# Patient Record
Sex: Male | Born: 1985 | Race: White | Hispanic: No | Marital: Single | Smoking: Former smoker
Health system: Southern US, Community
[De-identification: ages and names within clinical notes are randomized; demographics above are authoritative.]

## PROBLEM LIST (undated history)

## (undated) DIAGNOSIS — T7840XA Allergy, unspecified, initial encounter: Secondary | ICD-10-CM

## (undated) DIAGNOSIS — F909 Attention-deficit hyperactivity disorder, unspecified type: Secondary | ICD-10-CM

## (undated) DIAGNOSIS — K3580 Unspecified acute appendicitis: Secondary | ICD-10-CM

## (undated) DIAGNOSIS — B009 Herpesviral infection, unspecified: Secondary | ICD-10-CM

## (undated) DIAGNOSIS — F329 Major depressive disorder, single episode, unspecified: Secondary | ICD-10-CM

## (undated) DIAGNOSIS — F32A Depression, unspecified: Secondary | ICD-10-CM

## (undated) DIAGNOSIS — F419 Anxiety disorder, unspecified: Secondary | ICD-10-CM

## (undated) HISTORY — DX: Depression, unspecified: F32.A

## (undated) HISTORY — DX: Attention-deficit hyperactivity disorder, unspecified type: F90.9

## (undated) HISTORY — PX: APPENDECTOMY: SHX54

## (undated) HISTORY — DX: Herpesviral infection, unspecified: B00.9

## (undated) HISTORY — DX: Allergy, unspecified, initial encounter: T78.40XA

## (undated) HISTORY — DX: Anxiety disorder, unspecified: F41.9

## (undated) HISTORY — DX: Major depressive disorder, single episode, unspecified: F32.9

## (undated) HISTORY — PX: WISDOM TOOTH EXTRACTION: SHX21

---
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
## 2006-08-26 ENCOUNTER — Ambulatory Visit: Payer: Self-pay | Admitting: Internal Medicine

## 2006-08-27 ENCOUNTER — Ambulatory Visit: Payer: Self-pay | Admitting: Internal Medicine

## 2006-09-02 ENCOUNTER — Ambulatory Visit: Payer: Self-pay | Admitting: Internal Medicine

## 2006-09-03 ENCOUNTER — Ambulatory Visit: Payer: Self-pay | Admitting: Internal Medicine

## 2006-09-09 ENCOUNTER — Ambulatory Visit: Payer: Self-pay | Admitting: Internal Medicine

## 2006-09-12 ENCOUNTER — Ambulatory Visit: Payer: Self-pay | Admitting: Internal Medicine

## 2006-09-16 ENCOUNTER — Ambulatory Visit: Payer: Self-pay | Admitting: Internal Medicine

## 2006-09-19 ENCOUNTER — Ambulatory Visit: Payer: Self-pay | Admitting: Internal Medicine

## 2006-09-23 ENCOUNTER — Ambulatory Visit: Payer: Self-pay | Admitting: Internal Medicine

## 2006-09-26 ENCOUNTER — Ambulatory Visit: Payer: Self-pay | Admitting: Internal Medicine

## 2006-09-29 ENCOUNTER — Ambulatory Visit: Payer: Self-pay | Admitting: Internal Medicine

## 2006-10-02 ENCOUNTER — Ambulatory Visit: Payer: Self-pay | Admitting: Internal Medicine

## 2006-11-18 ENCOUNTER — Ambulatory Visit: Payer: Self-pay | Admitting: Internal Medicine

## 2006-12-11 ENCOUNTER — Ambulatory Visit: Payer: Self-pay | Admitting: Internal Medicine

## 2007-02-18 ENCOUNTER — Ambulatory Visit: Payer: Self-pay | Admitting: Internal Medicine

## 2007-02-27 ENCOUNTER — Ambulatory Visit: Payer: Self-pay | Admitting: Internal Medicine

## 2007-03-04 DIAGNOSIS — F909 Attention-deficit hyperactivity disorder, unspecified type: Secondary | ICD-10-CM | POA: Insufficient documentation

## 2007-03-04 DIAGNOSIS — J302 Other seasonal allergic rhinitis: Secondary | ICD-10-CM | POA: Insufficient documentation

## 2007-03-04 DIAGNOSIS — J3089 Other allergic rhinitis: Secondary | ICD-10-CM

## 2007-04-01 ENCOUNTER — Ambulatory Visit: Payer: Self-pay | Admitting: Internal Medicine

## 2007-04-28 ENCOUNTER — Ambulatory Visit: Payer: Self-pay | Admitting: Internal Medicine

## 2007-07-10 ENCOUNTER — Ambulatory Visit: Payer: Self-pay | Admitting: Internal Medicine

## 2007-07-17 ENCOUNTER — Ambulatory Visit: Payer: Self-pay | Admitting: Internal Medicine

## 2007-07-24 ENCOUNTER — Ambulatory Visit: Payer: Self-pay | Admitting: Internal Medicine

## 2007-08-11 ENCOUNTER — Ambulatory Visit: Payer: Self-pay | Admitting: Internal Medicine

## 2007-08-19 ENCOUNTER — Ambulatory Visit: Payer: Self-pay | Admitting: Internal Medicine

## 2007-08-24 ENCOUNTER — Ambulatory Visit: Payer: Self-pay | Admitting: Internal Medicine

## 2007-09-01 ENCOUNTER — Ambulatory Visit: Payer: Self-pay | Admitting: Internal Medicine

## 2007-09-10 ENCOUNTER — Ambulatory Visit: Payer: Self-pay | Admitting: Internal Medicine

## 2007-09-17 ENCOUNTER — Ambulatory Visit: Payer: Self-pay | Admitting: Internal Medicine

## 2007-10-01 ENCOUNTER — Ambulatory Visit: Payer: Self-pay | Admitting: Internal Medicine

## 2007-10-02 ENCOUNTER — Ambulatory Visit: Payer: Self-pay | Admitting: Internal Medicine

## 2007-10-05 ENCOUNTER — Ambulatory Visit: Payer: Self-pay | Admitting: Internal Medicine

## 2007-10-22 ENCOUNTER — Encounter: Payer: Self-pay | Admitting: Internal Medicine

## 2007-12-16 ENCOUNTER — Ambulatory Visit: Payer: Self-pay | Admitting: Internal Medicine

## 2008-01-15 ENCOUNTER — Ambulatory Visit: Payer: Self-pay | Admitting: Internal Medicine

## 2008-02-17 ENCOUNTER — Ambulatory Visit: Payer: Self-pay | Admitting: Internal Medicine

## 2008-02-29 ENCOUNTER — Ambulatory Visit: Payer: Self-pay | Admitting: Internal Medicine

## 2008-03-18 ENCOUNTER — Ambulatory Visit: Payer: Self-pay | Admitting: Internal Medicine

## 2008-04-12 ENCOUNTER — Ambulatory Visit: Payer: Self-pay | Admitting: Internal Medicine

## 2008-05-09 ENCOUNTER — Ambulatory Visit: Payer: Self-pay | Admitting: Internal Medicine

## 2008-05-19 ENCOUNTER — Ambulatory Visit: Payer: Self-pay | Admitting: Internal Medicine

## 2008-06-09 ENCOUNTER — Ambulatory Visit: Payer: Self-pay | Admitting: Internal Medicine

## 2008-06-27 ENCOUNTER — Ambulatory Visit: Payer: Self-pay | Admitting: Internal Medicine

## 2008-07-15 ENCOUNTER — Ambulatory Visit: Payer: Self-pay | Admitting: Internal Medicine

## 2008-07-29 ENCOUNTER — Ambulatory Visit: Payer: Self-pay | Admitting: Internal Medicine

## 2008-08-04 ENCOUNTER — Ambulatory Visit: Payer: Self-pay | Admitting: Internal Medicine

## 2008-08-09 ENCOUNTER — Ambulatory Visit: Payer: Self-pay | Admitting: Internal Medicine

## 2008-08-25 ENCOUNTER — Ambulatory Visit: Payer: Self-pay | Admitting: Internal Medicine

## 2008-09-19 ENCOUNTER — Ambulatory Visit: Payer: Self-pay | Admitting: Internal Medicine

## 2008-10-03 ENCOUNTER — Ambulatory Visit: Payer: Self-pay | Admitting: Internal Medicine

## 2008-10-12 ENCOUNTER — Ambulatory Visit: Payer: Self-pay | Admitting: Internal Medicine

## 2008-10-17 ENCOUNTER — Ambulatory Visit: Payer: Self-pay | Admitting: Internal Medicine

## 2008-10-24 ENCOUNTER — Ambulatory Visit: Payer: Self-pay | Admitting: Internal Medicine

## 2008-11-08 ENCOUNTER — Ambulatory Visit: Payer: Self-pay | Admitting: Internal Medicine

## 2008-11-17 ENCOUNTER — Ambulatory Visit: Payer: Self-pay | Admitting: Internal Medicine

## 2008-11-22 ENCOUNTER — Ambulatory Visit: Payer: Self-pay | Admitting: Internal Medicine

## 2008-11-23 ENCOUNTER — Ambulatory Visit: Payer: Self-pay | Admitting: Internal Medicine

## 2008-11-29 ENCOUNTER — Ambulatory Visit: Payer: Self-pay | Admitting: Internal Medicine

## 2008-12-13 ENCOUNTER — Ambulatory Visit: Payer: Self-pay | Admitting: Internal Medicine

## 2008-12-20 ENCOUNTER — Ambulatory Visit: Payer: Self-pay | Admitting: Internal Medicine

## 2008-12-29 ENCOUNTER — Ambulatory Visit: Payer: Self-pay | Admitting: Internal Medicine

## 2009-01-03 ENCOUNTER — Ambulatory Visit: Payer: Self-pay | Admitting: Internal Medicine

## 2009-01-24 ENCOUNTER — Ambulatory Visit: Payer: Self-pay | Admitting: Internal Medicine

## 2009-02-01 ENCOUNTER — Ambulatory Visit: Payer: Self-pay | Admitting: Internal Medicine

## 2009-02-21 ENCOUNTER — Ambulatory Visit: Payer: Self-pay | Admitting: Internal Medicine

## 2009-03-06 ENCOUNTER — Ambulatory Visit: Payer: Self-pay | Admitting: Internal Medicine

## 2009-03-15 ENCOUNTER — Ambulatory Visit: Payer: Self-pay | Admitting: Internal Medicine

## 2009-03-27 ENCOUNTER — Ambulatory Visit: Payer: Self-pay | Admitting: Internal Medicine

## 2009-04-05 ENCOUNTER — Ambulatory Visit: Payer: Self-pay | Admitting: Internal Medicine

## 2009-04-12 ENCOUNTER — Ambulatory Visit: Payer: Self-pay | Admitting: Internal Medicine

## 2009-05-11 ENCOUNTER — Ambulatory Visit: Payer: Self-pay | Admitting: Internal Medicine

## 2009-05-24 ENCOUNTER — Ambulatory Visit: Payer: Self-pay | Admitting: Internal Medicine

## 2009-05-31 ENCOUNTER — Ambulatory Visit: Payer: Self-pay | Admitting: Internal Medicine

## 2009-06-09 ENCOUNTER — Ambulatory Visit: Payer: Self-pay | Admitting: Internal Medicine

## 2009-06-27 ENCOUNTER — Ambulatory Visit: Payer: Self-pay | Admitting: Internal Medicine

## 2009-08-11 ENCOUNTER — Ambulatory Visit: Payer: Self-pay | Admitting: Internal Medicine

## 2009-09-29 ENCOUNTER — Ambulatory Visit: Payer: Self-pay | Admitting: Internal Medicine

## 2009-10-05 ENCOUNTER — Encounter: Payer: Self-pay | Admitting: Internal Medicine

## 2009-10-05 ENCOUNTER — Ambulatory Visit: Payer: Self-pay | Admitting: Internal Medicine

## 2009-10-16 ENCOUNTER — Ambulatory Visit: Payer: Self-pay | Admitting: Internal Medicine

## 2009-10-25 ENCOUNTER — Ambulatory Visit: Payer: Self-pay | Admitting: Internal Medicine

## 2009-11-20 ENCOUNTER — Ambulatory Visit: Payer: Self-pay | Admitting: Internal Medicine

## 2009-12-07 ENCOUNTER — Ambulatory Visit: Payer: Self-pay | Admitting: Internal Medicine

## 2009-12-20 ENCOUNTER — Ambulatory Visit: Payer: Self-pay | Admitting: Internal Medicine

## 2010-01-01 ENCOUNTER — Ambulatory Visit: Payer: Self-pay | Admitting: Internal Medicine

## 2010-01-09 ENCOUNTER — Ambulatory Visit: Payer: Self-pay | Admitting: Internal Medicine

## 2010-02-27 ENCOUNTER — Ambulatory Visit: Payer: Self-pay | Admitting: Internal Medicine

## 2010-03-18 ENCOUNTER — Encounter: Payer: Self-pay | Admitting: Orthopaedic Surgery

## 2010-03-20 ENCOUNTER — Ambulatory Visit: Payer: Self-pay | Admitting: Internal Medicine

## 2010-03-28 DIAGNOSIS — J301 Allergic rhinitis due to pollen: Secondary | ICD-10-CM

## 2010-03-29 NOTE — Miscellaneous (Signed)
Summary: Injection Record/[facility]  Injection Record/[facility]   Imported By: Sherian Rein 07/18/2009 11:35:34  _____________________________________________________________________  External Attachment:    Type:   Image     Comment:   External Document

## 2010-03-29 NOTE — Assessment & Plan Note (Signed)
Summary: rov/ mbw   CC:  follow up visit-allergies-has had flare up.Terry [name]  History of Present Illness:  09/10/07- 25 year-old returning for one year follow-up of allergic rhinitis.  He thinks allergy vaccine helps.  He continues at 1:50 given either here or at school.  Admits to smoking "stoma".  Minimal local reaction to vaccine.  Denies wheeze.  10/03/08- Allergic rhinits Some increased nasal congestion. Missed allergy shot this week and has been doing a lot of lawn mowing. He has gotten his shots here for past 6 months. Denies overstimulation from combining ADD meds and Allegra-D. Discussed his experience with nasonex and fluticasone. Doesn't like nasal sprays. Singulair may have helped some.  October 05, 2009- Allergic rhinitis He got busy and missed about a month of allergy vaccine. With that he has definitely realized increasing nasal congestion, sneeze and drip. He has returned and the allergy lab has resumed his shots with no problem. He does not get wheeze or asthma. Denies headache, purulent discharge, earache, cough.  Preventive Screening-Counseling & Management  Alcohol-Tobacco     Smoking Status: quit     Packs/Day: 1/4 to 1/2 ppd     Year Started: 2008     Year Quit: 2010     Tobacco Counseling: to remain off tobacco products  Current Medications (verified): 1)  Allegra D 12 Hr 2)  Prozac 20 Mg  Caps (Fluoxetine Hcl) .... Take 1 By Mouth Once Daily 3)  Allergy Vaccine Go To 1:10 Next Order 4)  Adderall 15 Mg Tabs (Amphetamine-Dextroamphetamine) .... Take 1 By Mouth Once Daily 5)  Valtrex 500 Mg Tabs (Valacyclovir Hcl) .... Take 1 By Mouth Once Daily  Allergies (verified): No Known Drug Allergies  Past History:  Past Medical History: Last updated: 10/03/2008 ALLERGIC RHINITIS (ICD-477.9) ATTENTION DEFICIT HYPERACTIVITY DISORDER (ICD-314.01)  Past Surgical History: Last updated: 10/03/2008 Wisdom teeth  Family History: Last updated: 10/03/2008 parents  living Mother- allergic rhinitis  Social History: Last updated: 10/03/2008 student- college Not smoking not married  Risk Factors: Smoking Status: quit (10/05/2009) Packs/Day: 1/4 to 1/2 ppd (10/05/2009)  Social History: Smoking Status:  quit  Review of Systems      See HPI       The patient complains of nasal congestion/difficulty breathing through nose, sneezing, and itching.  The patient denies shortness of breath with activity, shortness of breath at rest, productive cough, non-productive cough, coughing up blood, chest pain, irregular heartbeats, acid heartburn, indigestion, loss of appetite, weight change, abdominal pain, difficulty swallowing, sore throat, tooth/dental problems, headaches, ear ache, and rash.    Vital Signs:  Patient profile:   25 year old male Weight:      186.25 pounds O2 Sat:      96 % on Room air Pulse rate:   56 / minute BP sitting:   142 / 60  (left arm) Cuff size:   regular  Vitals Entered By: Reynaldo Minium CMA (October 05, 2009 10:53 AM)  O2 Flow:  Room air CC: follow up visit-allergies-has had flare up.    Physical Exam  Additional Exam:  General: A/Ox3; pleasant and cooperative, NAD, SKIN: no rash, lesions NODES: no lymphadenopathy HEENT: [place]/AT, EOM- WNL, Conjuctivae- clear, PERRLA, TM-WNL, Nose- pale, edematous, narrower on right, probable septal deviation, Throat- clear and wnl, Mallampati II NECK: Supple w/ fair ROM, JVD- none, normal carotid impulses w/o bruits Thyroid- normal to palpation CHEST: Clear to P&A HEART: RRR, no m/g/r heard ABDOMEN: Soft and nl;  JWJ:XBJY, nl pulses, no edema  NEURO: Grossly intact to observation      Impression & Recommendations:  Problem # 1:  ALLERGIC RHINITIS (ICD-477.9)  Loss of contrrol with lapse from allergy vaccine has satisfied him that the allergy shots have been helping. He is getting back to 0.5/ 1:10 without problems. He doesn't like nasal sprays as discussed again.. We discussed  his allegra D. We also discussed nasal strips and surgical options.if needed. He remains off tobacco successfully. The following medications were removed from the medication list:    Astepro 0.15 % Soln (Azelastine hcl) .Terry [name]... 1-2 puffs each nostril up to twice daily as needed  Other Orders: Est. Patient Level IV (04540)  Patient Instructions: 1)  Please schedule a follow-up appointment in 1 year. 2)  consider breathe-right nasal strips

## 2010-03-29 NOTE — Miscellaneous (Signed)
Summary: Injection Record / [facility]    Injection Record / [facility]    Imported By: Lennie Odor 10/27/2009 10:07:16  _____________________________________________________________________  External Attachment:    Type:   Image     Comment:   External Document

## 2010-04-10 ENCOUNTER — Ambulatory Visit (INDEPENDENT_AMBULATORY_CARE_PROVIDER_SITE_OTHER): Payer: BC Managed Care – PPO

## 2010-04-10 DIAGNOSIS — J301 Allergic rhinitis due to pollen: Secondary | ICD-10-CM

## 2010-04-25 ENCOUNTER — Ambulatory Visit (INDEPENDENT_AMBULATORY_CARE_PROVIDER_SITE_OTHER): Payer: BC Managed Care – PPO

## 2010-04-25 ENCOUNTER — Encounter: Payer: Self-pay | Admitting: Internal Medicine

## 2010-04-25 DIAGNOSIS — J301 Allergic rhinitis due to pollen: Secondary | ICD-10-CM

## 2010-05-02 ENCOUNTER — Ambulatory Visit (INDEPENDENT_AMBULATORY_CARE_PROVIDER_SITE_OTHER): Payer: BC Managed Care – PPO

## 2010-05-02 ENCOUNTER — Encounter: Payer: Self-pay | Admitting: Internal Medicine

## 2010-05-02 ENCOUNTER — Telehealth (INDEPENDENT_AMBULATORY_CARE_PROVIDER_SITE_OTHER): Payer: Self-pay | Admitting: *Deleted

## 2010-05-02 DIAGNOSIS — J301 Allergic rhinitis due to pollen: Secondary | ICD-10-CM

## 2010-05-07 ENCOUNTER — Ambulatory Visit (INDEPENDENT_AMBULATORY_CARE_PROVIDER_SITE_OTHER): Payer: BC Managed Care – PPO

## 2010-05-07 ENCOUNTER — Encounter: Payer: Self-pay | Admitting: Internal Medicine

## 2010-05-07 DIAGNOSIS — J301 Allergic rhinitis due to pollen: Secondary | ICD-10-CM

## 2010-05-08 NOTE — Assessment & Plan Note (Signed)
Summary: ALLERGY/CB   Nurse Visit   Allergies: No Known Drug Allergies  Orders Added: 1)  Allergy Injection (1) [95115] 

## 2010-05-08 NOTE — Progress Notes (Signed)
Summary: allergy test scheduled  Phone Note Call from Patient   Caller: Patient Call For: Young Summary of Call: Patient came in for his allergy shot and he is due for allergy testing last one was 4 years ago he has runny nose, pins and needle sinus, puffy eyes, lower energy this is with taking the allergy shots weekly. Patient wants to be scheduled for retesting but April 4th is already full. Please advise if patient can be worked in for the testing, He can be reached at [phone] Initial call taken by: Vedia Coffer,  May 02, 2010 3:39 PM  Follow-up for Phone Call        Called, spoke with pt.  ALT scheduled for 05/30/10 at 330.  Pt aware.  He was reminded no antihistamines 3 days prior.  He verbalized understanding. Follow-up by: Gweneth Dimitri RN,  May 02, 2010 4:09 PM

## 2010-05-09 ENCOUNTER — Ambulatory Visit (INDEPENDENT_AMBULATORY_CARE_PROVIDER_SITE_OTHER): Payer: BC Managed Care – PPO

## 2010-05-09 ENCOUNTER — Encounter: Payer: Self-pay | Admitting: Internal Medicine

## 2010-05-09 DIAGNOSIS — J301 Allergic rhinitis due to pollen: Secondary | ICD-10-CM

## 2010-05-14 ENCOUNTER — Encounter: Payer: Self-pay | Admitting: Internal Medicine

## 2010-05-14 ENCOUNTER — Ambulatory Visit (INDEPENDENT_AMBULATORY_CARE_PROVIDER_SITE_OTHER): Payer: BC Managed Care – PPO

## 2010-05-14 DIAGNOSIS — J301 Allergic rhinitis due to pollen: Secondary | ICD-10-CM

## 2010-05-15 NOTE — Assessment & Plan Note (Signed)
Summary: EXTRACT/10/CB  Nurse Visit   Allergies: No Known Drug Allergies  Orders Added: 1)  Antien Therapy Services,1 or multi ([facility]) [95165] 

## 2010-05-15 NOTE — Assessment & Plan Note (Signed)
Summary: allergy/cb  Nurse Visit   Allergies: No Known Drug Allergies  Orders Added: 1)  Allergy Injection (1) [95115] 

## 2010-05-15 NOTE — Miscellaneous (Signed)
Summary: Injection Financial risk analyst   Imported By: Sherian Rein 05/07/2010 14:16:45  _____________________________________________________________________  External Attachment:    Type:   Image     Comment:   External Document

## 2010-05-21 ENCOUNTER — Ambulatory Visit (INDEPENDENT_AMBULATORY_CARE_PROVIDER_SITE_OTHER): Payer: BC Managed Care – PPO

## 2010-05-21 DIAGNOSIS — J301 Allergic rhinitis due to pollen: Secondary | ICD-10-CM

## 2010-05-24 NOTE — Assessment & Plan Note (Signed)
Summary: allergy/cb  Nurse Visit   Allergies: No Known Drug Allergies  Orders Added: 1)  Allergy Injection (1) [95115] 

## 2010-05-29 ENCOUNTER — Encounter: Payer: Self-pay | Admitting: Internal Medicine

## 2010-05-30 ENCOUNTER — Encounter: Payer: Self-pay | Admitting: Internal Medicine

## 2010-05-30 ENCOUNTER — Encounter: Payer: Self-pay | Admitting: *Deleted

## 2010-05-30 ENCOUNTER — Ambulatory Visit (INDEPENDENT_AMBULATORY_CARE_PROVIDER_SITE_OTHER): Payer: BC Managed Care – PPO | Admitting: Internal Medicine

## 2010-05-30 VITALS — BP 112/68 | HR 66 | Ht 71.0 in | Wt 193.0 lb

## 2010-05-30 DIAGNOSIS — J309 Allergic rhinitis, unspecified: Secondary | ICD-10-CM

## 2010-05-30 NOTE — Patient Instructions (Signed)
I will have the allergy lab make up a new vaccine to start as a new build-up, after you use up your current vaccine supply.  You can take antihistamines/ allergy meds as needed. Please call if you have any questions.

## 2010-05-30 NOTE — Progress Notes (Signed)
  Subjective:    Patient ID: Terry [name], male    DOB: May 15, 1985, 25 y.o.   MRN: [id number]  HPI 74 yoM former smoker here for allergy skin testing with hx allergic rhinitis. Was worse in early March, with constant sneeze, headache, watery eyes postnasal drip. Doesn't think he had a cold. He has actually felt some better in the last 2 weeks, taking only occasional antihstamine not daily. Stopped as directed 3 days ago. Not much chest discomfort- maybe a little bit tight.  Has been consistent about allergy vaccine since restarted 1 year ago, but with last testing over 3 years ago, he felt it was time to reassess.  Dislikes nasal sprays. In senior year college, living in rental apartment- discussed ability to control dust/ mold and etc.  Skin test- Pos 05/30/10  He plans to stay i this area at least 2 more years.      Review of Systems See HPI Constitutional:   No weight loss, night sweats,  Fevers, chills, fatigue, lassitude. HEENT:   No headaches,  Difficulty swallowing,  Tooth/dental problems,  Sore throat,                Some sneezing, itching, , nasal congestion, post nasal drip,   CV:  No chest pain,  Orthopnea, PND, swelling in lower extremities, anasarca, dizziness, palpitations  GI  No heartburn, indigestion, abdominal pain, nausea, vomiting, diarrhea, change in bowel habits, loss of appetite  Resp: No shortness of breath with exertion or at rest.  No excess mucus, no productive cough,  No non-productive cough,  No coughing up of blood.  No change in color of mucus.  No wheezing.  No chest wall deformity  Skin: no rash or lesions.  GU: no dysuria, change in color of urine, no urgency or frequency.  No flank pain.  MS:  No joint pain or swelling.  No decreased range of motion.  No back pain.  Psych:  No change in mood or affect. No depression or anxiety.  No memory loss.      Objective:   Physical Exam General- Alert, Oriented, Affect-appropriate, Distress- none acute    WDWN young man  Skin- rash-none, lesions- none, excoriation- none. large tatoos on upper back  Lymphadenopathy- none  Head- atraumatic  Eyes- Gross vision intact, PERRLA, conjunctivae clear, secretions clear  Ears- Normal- Hearing, canals, Tm L ,   R ,  Nose- Clear,  No- Septal dev, mucus, polyps, erosion, perforation   Throat- Mallampati II , mucosa clear , drainage- none, tonsils- atrophic  Neck- flexible , trachea midline, no stridor , thyroid nl, carotid no bruit  Chest - symmetrical excursion , unlabored     Heart/CV- RRR , no murmur , no gallop  , no rub, nl s1 s2                     - JVD- none , edema- none, stasis changes- none, varices- none     Lung- clear to P&A, wheeze- none, cough- none , dullness-none, rub- none     Chest wall-   Abd- tender-no, distended-no, bowel sounds-present, HSM- no  Br/ Gen/ Rectal- Not done, not indicated  Extrem- cyanosis- none, clubbing, none, atrophy- none, strength- nl  Neuro- grossly intact to observation       Assessment & Plan:

## 2010-06-05 ENCOUNTER — Encounter: Payer: Self-pay | Admitting: Internal Medicine

## 2010-06-05 NOTE — Assessment & Plan Note (Signed)
He plans to remain in this area at least a few more years after graduation. Since he may be losing some control on current vaccine mix, he chose to remix and rebuild allergy vaccine based on today's testing. He will use up current supply then start new.

## 2010-06-11 ENCOUNTER — Ambulatory Visit (INDEPENDENT_AMBULATORY_CARE_PROVIDER_SITE_OTHER): Payer: BC Managed Care – PPO

## 2010-06-11 DIAGNOSIS — J309 Allergic rhinitis, unspecified: Secondary | ICD-10-CM

## 2010-06-13 ENCOUNTER — Ambulatory Visit (INDEPENDENT_AMBULATORY_CARE_PROVIDER_SITE_OTHER): Payer: BC Managed Care – PPO

## 2010-06-13 DIAGNOSIS — J309 Allergic rhinitis, unspecified: Secondary | ICD-10-CM

## 2010-06-18 ENCOUNTER — Ambulatory Visit (INDEPENDENT_AMBULATORY_CARE_PROVIDER_SITE_OTHER): Payer: BC Managed Care – PPO

## 2010-06-18 ENCOUNTER — Encounter: Payer: Self-pay | Admitting: Internal Medicine

## 2010-06-18 DIAGNOSIS — J309 Allergic rhinitis, unspecified: Secondary | ICD-10-CM

## 2010-06-26 ENCOUNTER — Ambulatory Visit (INDEPENDENT_AMBULATORY_CARE_PROVIDER_SITE_OTHER): Payer: BC Managed Care – PPO

## 2010-06-26 DIAGNOSIS — J309 Allergic rhinitis, unspecified: Secondary | ICD-10-CM

## 2010-07-10 NOTE — Assessment & Plan Note (Signed)
[facility]                             PULMONARY OFFICE NOTE   REDFORD, BEHRLE                      MRN:          [id number]  DATE:08/26/2006                            DOB:          15-Apr-1985    PROBLEM:  A 25 year old self-referred for allergy evaluation.   HISTORY OF PRESENT ILLNESS:  Eyes itch, sneezes, rhinorrhea, head and  chest congestion over the past 8 years. In the past, he had been able to  control with antihistamines, noting worst symptoms in spring and fall  with cats, house dust, and mold. Allegra D worked for a while. Singulair  has helped a little. He dislikes nasal sprays. Never diagnosed with  asthma.   MEDICATIONS:  Allegra D, Concerta.   ALLERGIES:  NO KNOWN DRUG ALLERGIES.   REVIEW OF SYSTEMS:  Pressure headaches, nasal congestion, sneezing, and  itching. No wheeze. Nothing purulent or bloody. No fever, adenopathy,  chill, or rash.   PAST MEDICAL HISTORY:  No ENT surgery. No history of asthma. He has had  no surgeries at all. Concerta apparently for ADHD. No history of skin  rash or urticaria, unusual reactions to foods or to insects. No  intolerance to latex, contrast dye, or aspirin.   SOCIAL HISTORY:  He is smoking 1/4 to 1/2 pack per day. One case of beer  a week. He is not married. Lives with parents. Student at [employer].  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   FAMILY HISTORY:  Mother with allergies.   PHYSICAL EXAMINATION:  VITAL SIGNS:  Weight 163 pounds. Blood pressure  120/60. Pulse 82. Room air saturation 100%.  GENERAL:  Healthy appearing young man.  SKIN:  Lightly tanned. No adenopathy found.  HEENT:  Turbinates edematous. Clear mucous bridging. No polyps.  Conjunctivae not injected. No periorbital edema. Pharynx is clear.  Tonsils are not prominent. There is erythema or drainage. Voice quality  is normal.  NECK:  Veins not distended. No thyromegaly.  CHEST:   Quiet clear lung fields.  HEART:  Normal sounds.  EXTREMITIES:  Without clubbing, cyanosis, or edema.   IMPRESSION:  Allergic rhinitis, seasonal and perennial.   PLAN:  1. Environmental precautions emphasizing the avoidance of dust, mold,      and animal dander's with print information given.  2. Suggested Claritin as an alternative to his Allegra and discussed      the use of Sudafed PE.  3. He is willing to try Nasonex, although he admits to disliking nasal      sprays. He requests allergy skin testing.  4. He will be scheduled to return for skin testing as available.     Clinton D. Maple Hudson, MD, Tonny Bollman, FACP  Electronically Signed    CDY/MedQ  DD: 08/30/2006  DT: 08/30/2006  Job #: [id number]

## 2010-07-10 NOTE — Assessment & Plan Note (Signed)
[facility]                             PULMONARY OFFICE NOTE   JOMES, GIRALDO                      MRN:          [id number]  DATE:08/27/2006                            DOB:          1985/03/22    ALLERGY FOLLOW UP:   PROBLEM:  Allergic rhinitis.   HISTORY:  This young man returns for allergy testing off of  antihistamines.  He has an air cleaner in his bedroom, two dogs at home.   OBJECTIVE:  Weight 163 pounds, blood pressure 122/62, pulse 78, room air  saturation 100%.   SKIN TESTS:  Positive histamine, negative diluent controls. Strong  positive reactions for dust mite and for dog. Lesser but significant  reactions for rag weed and some other fall weeds and tree pollens.   IMPRESSION:  Allergic rhinitis.   PLAN:  Environmental controls were re-emphasized and conservative  therapies were discussed.  He wants to try allergy vaccine in the hopes  that it will cause some long term blunting of this syndrome.  We had a  frank and realistic talk about risk and benefit of allergy vaccine,  goals and timing.  We are going to start allergy vaccine here and he  will be able to transfer administration to student health during the  school year.  Scheduled to return in 2 months, earlier p.r.n.     Clinton D. Maple Hudson, MD, Tonny Bollman, FACP  Electronically Signed    CDY/MedQ  DD: 08/30/2006  DT: 08/30/2006  Job #: [id number]

## 2010-07-24 ENCOUNTER — Ambulatory Visit (INDEPENDENT_AMBULATORY_CARE_PROVIDER_SITE_OTHER): Payer: BC Managed Care – PPO

## 2010-07-24 DIAGNOSIS — J309 Allergic rhinitis, unspecified: Secondary | ICD-10-CM

## 2010-08-17 ENCOUNTER — Ambulatory Visit (INDEPENDENT_AMBULATORY_CARE_PROVIDER_SITE_OTHER): Payer: BC Managed Care – PPO

## 2010-08-17 DIAGNOSIS — J309 Allergic rhinitis, unspecified: Secondary | ICD-10-CM

## 2010-09-04 ENCOUNTER — Ambulatory Visit (INDEPENDENT_AMBULATORY_CARE_PROVIDER_SITE_OTHER): Payer: BC Managed Care – PPO

## 2010-09-04 DIAGNOSIS — J309 Allergic rhinitis, unspecified: Secondary | ICD-10-CM

## 2010-09-13 ENCOUNTER — Ambulatory Visit (INDEPENDENT_AMBULATORY_CARE_PROVIDER_SITE_OTHER): Payer: BC Managed Care – PPO

## 2010-09-13 DIAGNOSIS — J309 Allergic rhinitis, unspecified: Secondary | ICD-10-CM

## 2010-10-04 ENCOUNTER — Ambulatory Visit: Payer: Self-pay | Admitting: Internal Medicine

## 2010-10-15 ENCOUNTER — Ambulatory Visit (INDEPENDENT_AMBULATORY_CARE_PROVIDER_SITE_OTHER): Payer: BC Managed Care – PPO

## 2010-10-15 DIAGNOSIS — J309 Allergic rhinitis, unspecified: Secondary | ICD-10-CM

## 2010-10-25 ENCOUNTER — Ambulatory Visit (INDEPENDENT_AMBULATORY_CARE_PROVIDER_SITE_OTHER): Payer: BC Managed Care – PPO

## 2010-10-25 DIAGNOSIS — J309 Allergic rhinitis, unspecified: Secondary | ICD-10-CM

## 2010-11-01 ENCOUNTER — Ambulatory Visit (INDEPENDENT_AMBULATORY_CARE_PROVIDER_SITE_OTHER): Payer: BC Managed Care – PPO

## 2010-11-01 DIAGNOSIS — J309 Allergic rhinitis, unspecified: Secondary | ICD-10-CM

## 2010-11-02 ENCOUNTER — Ambulatory Visit: Payer: BC Managed Care – PPO | Admitting: Internal Medicine

## 2010-11-05 ENCOUNTER — Encounter: Payer: Self-pay | Admitting: Internal Medicine

## 2010-11-14 ENCOUNTER — Ambulatory Visit (INDEPENDENT_AMBULATORY_CARE_PROVIDER_SITE_OTHER): Payer: BC Managed Care – PPO

## 2010-11-14 DIAGNOSIS — J309 Allergic rhinitis, unspecified: Secondary | ICD-10-CM

## 2010-11-16 ENCOUNTER — Encounter: Payer: Self-pay | Admitting: Internal Medicine

## 2010-11-16 ENCOUNTER — Ambulatory Visit (INDEPENDENT_AMBULATORY_CARE_PROVIDER_SITE_OTHER): Payer: BC Managed Care – PPO | Admitting: Internal Medicine

## 2010-11-16 VITALS — BP 118/72 | HR 74 | Ht 71.0 in | Wt 186.0 lb

## 2010-11-16 DIAGNOSIS — J309 Allergic rhinitis, unspecified: Secondary | ICD-10-CM

## 2010-11-16 MED ORDER — OLOPATADINE HCL 0.6 % NA SOLN: NASAL | Status: DC

## 2010-11-16 MED ORDER — FLUOXETINE HCL 20 MG PO CAPS: 20.0000 mg | ORAL_CAPSULE | Freq: Every day | ORAL | Status: DC

## 2010-11-16 NOTE — Assessment & Plan Note (Addendum)
We will continue allergy vaccine for now while he finishes school.  Continue Allegra D Despite his dislike of nasal sprays, he is willing to try a sample of patanase for occasional antihistamine use as discussed.  Consider reassessing his allergy vaccine to include grass based on his April 2012 skin testing. This would make sense if he is going to remain in this area after graduation. We will see how he feels to grass pollen season in the coming spring.

## 2010-11-16 NOTE — Progress Notes (Signed)
Subjective:    Patient ID: Terry [name], male    DOB: 1985/06/19, 25 y.o.   MRN: [id number]  HPI  05/30/10- 3 yoM former smoker here for allergy skin testing with hx allergic rhinitis. Was worse in early March, with constant sneeze, headache, watery eyes postnasal drip. Doesn't think he had a cold. He has actually felt some better in the last 2 weeks, taking only occasional antihstamine not daily. Stopped as directed 3 days ago. Not much chest discomfort- maybe a little bit tight.  Has been consistent about allergy vaccine since restarted 1 year ago, but with last testing over 3 years ago, he felt it was time to reassess.  Dislikes nasal sprays. In senior year college, living in rental apartment- discussed ability to control dust/ mold and etc.  Skin test- Pos 05/30/10  He plans to stay in this area at least 2 more years.   11/16/10- 25 yo male former smoker followed for allergic rhinitis, complicated by ADHD This is last year of college, living in a house. Allergy shots here- definitely help. Occasional Allegra-D if needed- helps with itchy eyes and sneeze but less effective with stuffiness. Ears and chest do fine. Again notes he dislikes nasal sprays. Failed Singulair. Allergy skin testing was repeated 05/30/2010 and shows changes from his current vaccine , in particular he is developing a sensitization to grass pollens. This would be consistent with his increased symptoms in March.  Review of Systems  Constitutional:   No-   weight loss, night sweats, fevers, chills, fatigue, lassitude. HEENT:   No-  headaches, difficulty swallowing, tooth/dental problems, sore throat,       No-current  sneezing, itching, ear ache, nasal congestion, post nasal drip,  CV:  No-   chest pain, orthopnea, PND, swelling in lower extremities, anasarca, dizziness, palpitations Resp: No-   shortness of breath with exertion or at rest.              No-   productive cough,  No non-productive cough,  No-  coughing up  of blood.              No-   change in color of mucus.  No- wheezing.   Skin: No-   rash or lesions. GI:  No-   heartburn, indigestion, abdominal pain, nausea, vomiting, diarrhea,                 change in bowel habits, loss of appetite GU: No-   dysuria, change in color of urine, no urgency or frequency.  No- flank pain. MS:  No-   joint pain or swelling.  No- decreased range of motion.  No- back pain. Neuro- grossly normal  Psych:  No- change in mood or affect. No depression or anxiety.  No memory loss.     Objective:   Physical Exam General- Alert, Oriented, Affect-appropriate, Distress- none acute, well appearing Jaymeson Mengel man Skin- rash-none, lesions- none, excoriation- none Lymphadenopathy- none Head- atraumatic            Eyes- Gross vision intact, PERRLA, conjunctivae clear secretions            Ears- Hearing, canals-normal            Nose- Clear, no-Septal dev, mucus, polyps, erosion, perforation             Throat- Mallampati II , mucosa clear , drainage- none, tonsils- atrophic Neck- flexible , trachea midline, no stridor , thyroid nl, carotid no bruit Chest - symmetrical excursion ,  unlabored           Heart/CV- RRR , no murmur , no gallop  , no rub, nl s1 s2                           - JVD- none , edema- none, stasis changes- none, varices- none           Lung- clear to P&A, wheeze- none, cough- none , dullness-none, rub- none           Chest wall-  Abd- tender-no, distended-no, bowel sounds-present, HSM- no Br/ Gen/ Rectal- Not done, not indicated Extrem- cyanosis- none, clubbing, none, atrophy- none, strength- nl Neuro- grossly intact to observation

## 2010-11-16 NOTE — Patient Instructions (Addendum)
Continue allergy vaccine  Sample/ script Patanase     1-2 puffs each nostril up to twice daily as needed  Courtesy refill of Prozac while here

## 2010-11-26 ENCOUNTER — Ambulatory Visit (INDEPENDENT_AMBULATORY_CARE_PROVIDER_SITE_OTHER): Payer: BC Managed Care – PPO

## 2010-11-26 DIAGNOSIS — J309 Allergic rhinitis, unspecified: Secondary | ICD-10-CM

## 2010-12-17 ENCOUNTER — Ambulatory Visit (INDEPENDENT_AMBULATORY_CARE_PROVIDER_SITE_OTHER): Payer: BC Managed Care – PPO

## 2010-12-17 DIAGNOSIS — J309 Allergic rhinitis, unspecified: Secondary | ICD-10-CM

## 2011-01-03 ENCOUNTER — Ambulatory Visit (INDEPENDENT_AMBULATORY_CARE_PROVIDER_SITE_OTHER): Payer: BC Managed Care – PPO

## 2011-01-03 DIAGNOSIS — J309 Allergic rhinitis, unspecified: Secondary | ICD-10-CM

## 2011-02-08 ENCOUNTER — Ambulatory Visit (INDEPENDENT_AMBULATORY_CARE_PROVIDER_SITE_OTHER): Payer: BC Managed Care – PPO

## 2011-02-08 DIAGNOSIS — J309 Allergic rhinitis, unspecified: Secondary | ICD-10-CM

## 2011-03-07 ENCOUNTER — Ambulatory Visit (INDEPENDENT_AMBULATORY_CARE_PROVIDER_SITE_OTHER): Payer: BC Managed Care – PPO

## 2011-03-07 DIAGNOSIS — J309 Allergic rhinitis, unspecified: Secondary | ICD-10-CM

## 2011-03-11 ENCOUNTER — Ambulatory Visit (INDEPENDENT_AMBULATORY_CARE_PROVIDER_SITE_OTHER): Payer: BC Managed Care – PPO

## 2011-03-11 DIAGNOSIS — J309 Allergic rhinitis, unspecified: Secondary | ICD-10-CM

## 2011-03-12 ENCOUNTER — Encounter: Payer: Self-pay | Admitting: Internal Medicine

## 2011-04-15 ENCOUNTER — Ambulatory Visit (INDEPENDENT_AMBULATORY_CARE_PROVIDER_SITE_OTHER): Payer: BC Managed Care – PPO

## 2011-04-15 DIAGNOSIS — J309 Allergic rhinitis, unspecified: Secondary | ICD-10-CM

## 2011-04-19 ENCOUNTER — Ambulatory Visit (INDEPENDENT_AMBULATORY_CARE_PROVIDER_SITE_OTHER): Payer: BC Managed Care – PPO

## 2011-04-19 DIAGNOSIS — J309 Allergic rhinitis, unspecified: Secondary | ICD-10-CM

## 2011-04-29 ENCOUNTER — Ambulatory Visit: Payer: BC Managed Care – PPO

## 2011-04-29 ENCOUNTER — Ambulatory Visit (INDEPENDENT_AMBULATORY_CARE_PROVIDER_SITE_OTHER): Payer: BC Managed Care – PPO

## 2011-04-29 DIAGNOSIS — J309 Allergic rhinitis, unspecified: Secondary | ICD-10-CM

## 2011-05-21 ENCOUNTER — Ambulatory Visit (INDEPENDENT_AMBULATORY_CARE_PROVIDER_SITE_OTHER): Payer: BC Managed Care – PPO

## 2011-05-21 DIAGNOSIS — J309 Allergic rhinitis, unspecified: Secondary | ICD-10-CM

## 2011-05-23 ENCOUNTER — Ambulatory Visit (INDEPENDENT_AMBULATORY_CARE_PROVIDER_SITE_OTHER): Payer: BC Managed Care – PPO

## 2011-05-23 DIAGNOSIS — J309 Allergic rhinitis, unspecified: Secondary | ICD-10-CM

## 2011-07-10 ENCOUNTER — Ambulatory Visit (INDEPENDENT_AMBULATORY_CARE_PROVIDER_SITE_OTHER): Payer: BC Managed Care – PPO

## 2011-07-10 DIAGNOSIS — J309 Allergic rhinitis, unspecified: Secondary | ICD-10-CM

## 2011-09-02 ENCOUNTER — Ambulatory Visit (INDEPENDENT_AMBULATORY_CARE_PROVIDER_SITE_OTHER): Payer: BC Managed Care – PPO

## 2011-09-02 DIAGNOSIS — J309 Allergic rhinitis, unspecified: Secondary | ICD-10-CM

## 2011-09-26 ENCOUNTER — Ambulatory Visit (INDEPENDENT_AMBULATORY_CARE_PROVIDER_SITE_OTHER): Payer: BC Managed Care – PPO

## 2011-09-26 DIAGNOSIS — J309 Allergic rhinitis, unspecified: Secondary | ICD-10-CM

## 2011-09-30 ENCOUNTER — Ambulatory Visit (INDEPENDENT_AMBULATORY_CARE_PROVIDER_SITE_OTHER): Payer: BC Managed Care – PPO

## 2011-09-30 DIAGNOSIS — J309 Allergic rhinitis, unspecified: Secondary | ICD-10-CM

## 2011-11-05 ENCOUNTER — Ambulatory Visit (INDEPENDENT_AMBULATORY_CARE_PROVIDER_SITE_OTHER): Payer: BC Managed Care – PPO

## 2011-11-05 DIAGNOSIS — J309 Allergic rhinitis, unspecified: Secondary | ICD-10-CM

## 2011-11-08 ENCOUNTER — Ambulatory Visit (INDEPENDENT_AMBULATORY_CARE_PROVIDER_SITE_OTHER): Payer: BC Managed Care – PPO

## 2011-11-08 DIAGNOSIS — J309 Allergic rhinitis, unspecified: Secondary | ICD-10-CM

## 2011-11-22 ENCOUNTER — Ambulatory Visit (INDEPENDENT_AMBULATORY_CARE_PROVIDER_SITE_OTHER): Payer: BC Managed Care – PPO

## 2011-11-22 ENCOUNTER — Ambulatory Visit (INDEPENDENT_AMBULATORY_CARE_PROVIDER_SITE_OTHER): Payer: BC Managed Care – PPO | Admitting: Internal Medicine

## 2011-11-22 ENCOUNTER — Encounter: Payer: Self-pay | Admitting: Internal Medicine

## 2011-11-22 VITALS — BP 124/82 | HR 77 | Ht 71.0 in | Wt 188.8 lb

## 2011-11-22 DIAGNOSIS — J3089 Other allergic rhinitis: Secondary | ICD-10-CM

## 2011-11-22 DIAGNOSIS — J302 Other seasonal allergic rhinitis: Secondary | ICD-10-CM

## 2011-11-22 DIAGNOSIS — J309 Allergic rhinitis, unspecified: Secondary | ICD-10-CM

## 2011-11-22 NOTE — Patient Instructions (Addendum)
We can continue allergy vaccine. Work with the allergy lab to keep on schedule so you can build the potency of your shots.  Ok to take an antihistamine/ decongestant like the allegra-d as needed  Please call as needed

## 2011-11-22 NOTE — Progress Notes (Signed)
Subjective:    Patient ID: Terry [name], male    DOB: 08/16/1985, 26 y.o.   MRN: [id number]  HPI  05/30/10- 22 yoM former smoker here for allergy skin testing with hx allergic rhinitis. Was worse in early March, with constant sneeze, headache, watery eyes postnasal drip. Doesn't think he had a cold. He has actually felt some better in the last 2 weeks, taking only occasional antihstamine not daily. Stopped as directed 3 days ago. Not much chest discomfort- maybe a little bit tight.  Has been consistent about allergy vaccine since restarted 1 year ago, but with last testing over 3 years ago, he felt it was time to reassess.  Dislikes nasal sprays. In senior year college, living in rental apartment- discussed ability to control dust/ mold and etc.  Skin test- Pos 05/30/10  He plans to stay in this area at least 2 more years.   11/16/10- 26 yo male former smoker followed for allergic rhinitis, complicated by ADHD This is last year of college, living in a house. Allergy shots here- definitely help. Occasional Allegra-D if needed- helps with itchy eyes and sneeze but less effective with stuffiness. Ears and chest do fine. Again notes he dislikes nasal sprays. Failed Singulair. Allergy skin testing was repeated 05/30/2010 and shows changes from his current vaccine , in particular he is developing a sensitization to grass pollens. This would be consistent with his increased symptoms in March.  11/22/11- 26 yo male former smoker followed for allergic rhinitis, complicated by ADHD Follows for: Doing well, still taking allergy vaccine. He restarted and is rebuilding from 1:50,000 [facility] as planned. 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 No longer in college. No plans to leave this area. He feels his allergies are well controlled, rarely needing Allegra-D in pollen season. He prefers to stay on allergy shots. He declines flu vaccine.  Review of Systems- see HPI Constitutional:   No-    weight loss, night sweats, fevers, chills, fatigue, lassitude. HEENT:   No-  headaches, difficulty swallowing, tooth/dental problems, sore throat,       No-current  sneezing, itching, ear ache, nasal congestion, post nasal drip,  CV:  No-   chest pain, orthopnea, PND, swelling in lower extremities, anasarca, dizziness, palpitations Resp: No-   shortness of breath with exertion or at rest.              No-   productive cough,  No non-productive cough,  No-  coughing up of blood.              No-   change in color of mucus.  No- wheezing.   Skin: No-   rash or lesions. GI:  No-   heartburn, indigestion, abdominal pain, nausea, vomiting,  GU:  MS:  No-   joint pain or swelling.   Neuro- grossly normal  Psych:  No- change in mood or affect. No depression or anxiety.  No memory loss.     Objective:   Physical Exam General- Alert, Oriented, Affect-appropriate, Distress- none acute, well appearing young man Skin- rash-none, lesions- none, excoriation- none Lymphadenopathy- none Head- atraumatic            Eyes- Gross vision intact, PERRLA, conjunctivae clear secretions            Ears- Hearing, canals-normal            Nose- Clear, no-Septal dev, mucus, polyps, erosion, perforation  Throat- Mallampati II , mucosa clear , drainage- none, tonsils- atrophic Neck- flexible , trachea midline, no stridor , thyroid nl, carotid no bruit Chest - symmetrical excursion , unlabored           Heart/CV- RRR , no murmur , no gallop  , no rub, nl s1 s2                           - JVD- none , edema- none, stasis changes- none, varices- none           Lung- clear to P&A, wheeze- none, cough- none , dullness-none, rub- none           Chest wall-  Abd-  Br/ Gen/ Rectal- Not done, not indicated Extrem- cyanosis- none, clubbing, none, atrophy- none, strength- nl Neuro- grossly intact to observation

## 2011-12-03 ENCOUNTER — Ambulatory Visit (INDEPENDENT_AMBULATORY_CARE_PROVIDER_SITE_OTHER): Payer: BC Managed Care – PPO

## 2011-12-03 DIAGNOSIS — J309 Allergic rhinitis, unspecified: Secondary | ICD-10-CM

## 2011-12-06 ENCOUNTER — Ambulatory Visit (INDEPENDENT_AMBULATORY_CARE_PROVIDER_SITE_OTHER): Payer: BC Managed Care – PPO

## 2011-12-06 DIAGNOSIS — J309 Allergic rhinitis, unspecified: Secondary | ICD-10-CM

## 2011-12-10 ENCOUNTER — Ambulatory Visit (INDEPENDENT_AMBULATORY_CARE_PROVIDER_SITE_OTHER): Payer: BC Managed Care – PPO

## 2011-12-10 ENCOUNTER — Encounter: Payer: Self-pay | Admitting: Internal Medicine

## 2011-12-10 DIAGNOSIS — J309 Allergic rhinitis, unspecified: Secondary | ICD-10-CM

## 2011-12-23 ENCOUNTER — Ambulatory Visit (INDEPENDENT_AMBULATORY_CARE_PROVIDER_SITE_OTHER): Payer: BC Managed Care – PPO

## 2011-12-23 DIAGNOSIS — J309 Allergic rhinitis, unspecified: Secondary | ICD-10-CM

## 2011-12-24 ENCOUNTER — Ambulatory Visit (INDEPENDENT_AMBULATORY_CARE_PROVIDER_SITE_OTHER): Payer: BC Managed Care – PPO

## 2011-12-24 DIAGNOSIS — J309 Allergic rhinitis, unspecified: Secondary | ICD-10-CM

## 2011-12-27 ENCOUNTER — Ambulatory Visit (INDEPENDENT_AMBULATORY_CARE_PROVIDER_SITE_OTHER): Payer: BC Managed Care – PPO

## 2011-12-27 DIAGNOSIS — J309 Allergic rhinitis, unspecified: Secondary | ICD-10-CM

## 2011-12-31 ENCOUNTER — Ambulatory Visit (INDEPENDENT_AMBULATORY_CARE_PROVIDER_SITE_OTHER): Payer: BC Managed Care – PPO

## 2011-12-31 DIAGNOSIS — J309 Allergic rhinitis, unspecified: Secondary | ICD-10-CM

## 2012-01-01 ENCOUNTER — Ambulatory Visit (INDEPENDENT_AMBULATORY_CARE_PROVIDER_SITE_OTHER): Payer: BC Managed Care – PPO

## 2012-01-01 DIAGNOSIS — J309 Allergic rhinitis, unspecified: Secondary | ICD-10-CM

## 2012-01-06 ENCOUNTER — Ambulatory Visit (INDEPENDENT_AMBULATORY_CARE_PROVIDER_SITE_OTHER): Payer: BC Managed Care – PPO

## 2012-01-06 DIAGNOSIS — J309 Allergic rhinitis, unspecified: Secondary | ICD-10-CM

## 2012-01-09 ENCOUNTER — Ambulatory Visit (INDEPENDENT_AMBULATORY_CARE_PROVIDER_SITE_OTHER): Payer: BC Managed Care – PPO

## 2012-01-09 DIAGNOSIS — J309 Allergic rhinitis, unspecified: Secondary | ICD-10-CM

## 2012-01-16 ENCOUNTER — Ambulatory Visit (INDEPENDENT_AMBULATORY_CARE_PROVIDER_SITE_OTHER): Payer: BC Managed Care – PPO

## 2012-01-16 DIAGNOSIS — J309 Allergic rhinitis, unspecified: Secondary | ICD-10-CM

## 2012-01-27 ENCOUNTER — Ambulatory Visit (INDEPENDENT_AMBULATORY_CARE_PROVIDER_SITE_OTHER): Payer: BC Managed Care – PPO

## 2012-01-27 DIAGNOSIS — J309 Allergic rhinitis, unspecified: Secondary | ICD-10-CM

## 2012-02-27 ENCOUNTER — Ambulatory Visit (INDEPENDENT_AMBULATORY_CARE_PROVIDER_SITE_OTHER): Payer: BC Managed Care – PPO

## 2012-02-27 DIAGNOSIS — J309 Allergic rhinitis, unspecified: Secondary | ICD-10-CM

## 2012-03-03 ENCOUNTER — Ambulatory Visit (INDEPENDENT_AMBULATORY_CARE_PROVIDER_SITE_OTHER): Payer: BC Managed Care – PPO

## 2012-03-03 DIAGNOSIS — J309 Allergic rhinitis, unspecified: Secondary | ICD-10-CM

## 2012-03-13 ENCOUNTER — Ambulatory Visit (INDEPENDENT_AMBULATORY_CARE_PROVIDER_SITE_OTHER): Payer: BC Managed Care – PPO

## 2012-03-13 DIAGNOSIS — J309 Allergic rhinitis, unspecified: Secondary | ICD-10-CM

## 2012-03-16 ENCOUNTER — Other Ambulatory Visit: Payer: Self-pay | Admitting: Family Medicine

## 2012-03-16 ENCOUNTER — Ambulatory Visit (INDEPENDENT_AMBULATORY_CARE_PROVIDER_SITE_OTHER): Payer: BC Managed Care – PPO

## 2012-03-16 DIAGNOSIS — J309 Allergic rhinitis, unspecified: Secondary | ICD-10-CM

## 2012-03-16 DIAGNOSIS — IMO0002 Reserved for concepts with insufficient information to code with codable children: Secondary | ICD-10-CM

## 2012-03-18 ENCOUNTER — Other Ambulatory Visit: Payer: Self-pay | Admitting: Family Medicine

## 2012-03-18 ENCOUNTER — Ambulatory Visit
Admission: RE | Admit: 2012-03-18 | Discharge: 2012-03-18 | Disposition: A | Discharge status: Home / Self Care | Payer: BC Managed Care – PPO | Source: Ambulatory Visit | Attending: Family Medicine | Admitting: Family Medicine

## 2012-03-18 DIAGNOSIS — IMO0002 Reserved for concepts with insufficient information to code with codable children: Secondary | ICD-10-CM

## 2012-03-31 ENCOUNTER — Ambulatory Visit (INDEPENDENT_AMBULATORY_CARE_PROVIDER_SITE_OTHER): Payer: BC Managed Care – PPO

## 2012-03-31 DIAGNOSIS — J309 Allergic rhinitis, unspecified: Secondary | ICD-10-CM

## 2012-04-01 ENCOUNTER — Ambulatory Visit: Payer: BC Managed Care – PPO

## 2012-04-03 ENCOUNTER — Ambulatory Visit: Payer: BC Managed Care – PPO

## 2012-04-08 ENCOUNTER — Encounter: Payer: Self-pay | Admitting: Internal Medicine

## 2012-05-05 ENCOUNTER — Ambulatory Visit (INDEPENDENT_AMBULATORY_CARE_PROVIDER_SITE_OTHER): Payer: BC Managed Care – PPO

## 2012-05-05 DIAGNOSIS — J309 Allergic rhinitis, unspecified: Secondary | ICD-10-CM

## 2012-06-05 ENCOUNTER — Ambulatory Visit (INDEPENDENT_AMBULATORY_CARE_PROVIDER_SITE_OTHER): Payer: BC Managed Care – PPO

## 2012-06-05 DIAGNOSIS — J309 Allergic rhinitis, unspecified: Secondary | ICD-10-CM

## 2012-07-02 ENCOUNTER — Ambulatory Visit (INDEPENDENT_AMBULATORY_CARE_PROVIDER_SITE_OTHER): Payer: BC Managed Care – PPO

## 2012-07-02 DIAGNOSIS — J309 Allergic rhinitis, unspecified: Secondary | ICD-10-CM

## 2012-11-20 ENCOUNTER — Encounter: Payer: Self-pay | Admitting: Internal Medicine

## 2012-11-20 ENCOUNTER — Ambulatory Visit (INDEPENDENT_AMBULATORY_CARE_PROVIDER_SITE_OTHER): Payer: BC Managed Care – PPO | Admitting: Internal Medicine

## 2012-11-20 VITALS — BP 122/80 | HR 52 | Ht 71.0 in | Wt 199.6 lb

## 2012-11-20 DIAGNOSIS — J302 Other seasonal allergic rhinitis: Secondary | ICD-10-CM

## 2012-11-20 DIAGNOSIS — J309 Allergic rhinitis, unspecified: Secondary | ICD-10-CM

## 2012-11-20 NOTE — Patient Instructions (Addendum)
We have stopped allergy vaccine  I do recommend the flu vaccine for every body and this is the time to get it.   Please call as needed

## 2012-11-20 NOTE — Progress Notes (Signed)
Subjective:    Patient ID: Terry [name], male    DOB: 02-08-1986, 27 y.o.   MRN: [id number]  HPI  05/30/10- 32 yoM former smoker here for allergy skin testing with hx allergic rhinitis. Was worse in early March, with constant sneeze, headache, watery eyes postnasal drip. Doesn't think he had a cold. He has actually felt some better in the last 2 weeks, taking only occasional antihstamine not daily. Stopped as directed 3 days ago. Not much chest discomfort- maybe a little bit tight.  Has been consistent about allergy vaccine since restarted 1 year ago, but with last testing over 3 years ago, he felt it was time to reassess.  Dislikes nasal sprays. In senior year college, living in rental apartment- discussed ability to control dust/ mold and etc.  Skin test- Pos 05/30/10  He plans to stay in this area at least 2 more years.   11/16/10- 27 yo male former smoker followed for allergic rhinitis, complicated by ADHD This is last year of college, living in a house. Allergy shots here- definitely help. Occasional Allegra-D if needed- helps with itchy eyes and sneeze but less effective with stuffiness. Ears and chest do fine. Again notes he dislikes nasal sprays. Failed Singulair. Allergy skin testing was repeated 05/30/2010 and shows changes from his current vaccine , in particular he is developing a sensitization to grass pollens. This would be consistent with his increased symptoms in March.  11/22/11- 27 yo male former smoker followed for allergic rhinitis, complicated by ADHD Follows for: Doing well, still taking allergy vaccine. He restarted and is rebuilding from 1:50,000 [facility] as planned. 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 No longer in college. No plans to leave this area. He feels his allergies are well controlled, rarely needing Allegra-D in pollen season. He prefers to stay on allergy shots. He declines flu vaccine.  11/20/12- 27 yo male former smoker followed for  allergic rhinitis, complicated by ADHD FOLLOWS FOR: has not been on allergy vaccine for some time; can't tell he has not had them. Terry [name] drop-off of allergy vaccine in May of 2014 "so far so good". Dislikes nasal sprays. Declines flu vaccine-discussed.  Review of Systems- see HPI Constitutional:   No-   weight loss, night sweats, fevers, chills, fatigue, lassitude. HEENT:   No-  headaches, difficulty swallowing, tooth/dental problems, sore throat,       No-current  sneezing, itching, ear ache, +nasal congestion, post nasal drip,  CV:  No-   chest pain, orthopnea, PND, swelling in lower extremities, anasarca, dizziness, palpitations Resp: No-   shortness of breath with exertion or at rest.              No-   productive cough,  No non-productive cough,  No-  coughing up of blood.              No-   change in color of mucus.  No- wheezing.   Skin: No-   rash or lesions. GI:  No-   heartburn, indigestion, abdominal pain, nausea, vomiting,  GU:  MS:  No-   joint pain or swelling.   Neuro- grossly normal  Psych:  No- change in mood or affect. No depression or anxiety.  No memory loss.     Objective:   Physical Exam General- Alert, Oriented, Affect-appropriate, Distress- none acute, well appearing Karla Pavone man Skin- rash-none, lesions- none, excoriation- none Lymphadenopathy- none Head- atraumatic  Eyes- Gross vision intact, PERRLA, conjunctivae clear secretions            Ears- Hearing, canals-normal            Nose- Clear, no-Septal dev, mucus, polyps, erosion, perforation             Throat- Mallampati II , mucosa clear , drainage- none, tonsils- atrophic Neck- flexible , trachea midline, no stridor , thyroid nl, carotid no bruit Chest - symmetrical excursion , unlabored           Heart/CV- RRR , no murmur , no gallop  , no rub, nl s1 s2                           - JVD- none , edema- none, stasis changes- none, varices- none           Lung- clear to P&A, wheeze- none, cough-  none , dullness-none, rub- none           Chest wall-  Abd-  Br/ Gen/ Rectal- Not done, not indicated Extrem- cyanosis- none, clubbing, none, atrophy- none, strength- nl Neuro- grossly intact to observation

## 2012-12-01 NOTE — Assessment & Plan Note (Signed)
Breasts and has stopped allergy vaccine. We discussed use of occasional over-the-counter antihistamine. I recommended flu vaccine although he declines. I will be happy to see him again if needed

## 2013-02-08 ENCOUNTER — Emergency Department (HOSPITAL_COMMUNITY): Payer: BC Managed Care – PPO | Admitting: Anesthesiology

## 2013-02-08 ENCOUNTER — Encounter (HOSPITAL_COMMUNITY): Payer: BC Managed Care – PPO | Admitting: Anesthesiology

## 2013-02-08 ENCOUNTER — Emergency Department (HOSPITAL_COMMUNITY): Payer: BC Managed Care – PPO

## 2013-02-08 ENCOUNTER — Encounter (HOSPITAL_COMMUNITY): Payer: Self-pay | Admitting: Emergency Medicine

## 2013-02-08 ENCOUNTER — Encounter (HOSPITAL_COMMUNITY)
Admission: EM | Disposition: A | Discharge status: Home / Self Care | Payer: Self-pay | Source: Home / Self Care | Attending: Emergency Medicine

## 2013-02-08 ENCOUNTER — Inpatient Hospital Stay: Admit: 2013-02-08 | Payer: Self-pay | Admitting: Surgery

## 2013-02-08 ENCOUNTER — Observation Stay (HOSPITAL_COMMUNITY)
Admission: EM | Admit: 2013-02-08 | Discharge: 2013-02-09 | Disposition: A | Discharge status: Home / Self Care | Payer: BC Managed Care – PPO | Attending: General Surgery | Admitting: General Surgery

## 2013-02-08 DIAGNOSIS — K3580 Unspecified acute appendicitis: Secondary | ICD-10-CM

## 2013-02-08 DIAGNOSIS — F909 Attention-deficit hyperactivity disorder, unspecified type: Secondary | ICD-10-CM | POA: Insufficient documentation

## 2013-02-08 DIAGNOSIS — D72829 Elevated white blood cell count, unspecified: Secondary | ICD-10-CM | POA: Insufficient documentation

## 2013-02-08 DIAGNOSIS — Z87891 Personal history of nicotine dependence: Secondary | ICD-10-CM | POA: Insufficient documentation

## 2013-02-08 HISTORY — PX: LAPAROSCOPIC APPENDECTOMY: SHX408

## 2013-02-08 HISTORY — DX: Unspecified acute appendicitis: K35.80

## 2013-02-08 LAB — COMPREHENSIVE METABOLIC PANEL
Albumin: 4.9 g/dL (ref 3.5–5.2)
BUN: 15 mg/dL (ref 6–23)
Calcium: 10.2 mg/dL (ref 8.4–10.5)
Chloride: 98 mEq/L (ref 96–112)
GFR calc non Af Amer: >90 mL/min (ref >90)
Glucose, Bld: 119 mg/dL — ABNORMAL HIGH (ref 70–99)
Total Protein: 7.9 g/dL (ref 6.0–8.3)

## 2013-02-08 LAB — CBC WITH DIFFERENTIAL/PLATELET
Basophils Relative: 0 % (ref 0–1)
MCH: 31.6 pg (ref 26.0–34.0)
MCV: 86.9 fL (ref 78.0–100.0)
Monocytes Absolute: 0.9 10*3/uL (ref 0.1–1.0)
Monocytes Relative: 4 % (ref 3–12)
Neutrophils Relative %: 90 % — ABNORMAL HIGH (ref 43–77)
RBC: 5.25 MIL/uL (ref 4.22–5.81)
RDW: 12.6 % (ref 11.5–15.5)
WBC: 23 10*3/uL — ABNORMAL HIGH (ref 4.0–10.5)

## 2013-02-08 SURGERY — APPENDECTOMY, LAPAROSCOPIC
Anesthesia: General | Site: Abdomen

## 2013-02-08 MED ORDER — CISATRACURIUM BESYLATE (PF) 10 MG/5ML IV SOLN
INTRAVENOUS | Status: DC | PRN
  Administered 2013-02-08: 4 mg via INTRAVENOUS

## 2013-02-08 MED ORDER — ONDANSETRON HCL 4 MG/2ML IJ SOLN: 4.0000 mg | Freq: Four times a day (QID) | INTRAMUSCULAR | Status: DC | PRN

## 2013-02-08 MED ORDER — ACETAMINOPHEN 650 MG RE SUPP: 650.0000 mg | Freq: Four times a day (QID) | RECTAL | Status: DC | PRN

## 2013-02-08 MED ORDER — LACTATED RINGERS IV SOLN
INTRAVENOUS | Status: DC | PRN
  Administered 2013-02-08: 23:00:00 via INTRAVENOUS

## 2013-02-08 MED ORDER — CISATRACURIUM BESYLATE 20 MG/10ML IV SOLN
INTRAVENOUS | Status: AC
  Filled 2013-02-08: qty 10

## 2013-02-08 MED ORDER — PROPOFOL 10 MG/ML IV BOLUS
INTRAVENOUS | Status: AC
  Filled 2013-02-08: qty 20

## 2013-02-08 MED ORDER — KCL IN DEXTROSE-NACL 20-5-0.45 MEQ/L-%-% IV SOLN: INTRAVENOUS | Status: DC

## 2013-02-08 MED ORDER — FENTANYL CITRATE 0.05 MG/ML IJ SOLN
INTRAMUSCULAR | Status: AC
  Filled 2013-02-08: qty 5

## 2013-02-08 MED ORDER — IOHEXOL 300 MG/ML  SOLN
100.0000 mL | Freq: Once | INTRAMUSCULAR | Status: AC | PRN
  Administered 2013-02-08: 100 mL via INTRAVENOUS

## 2013-02-08 MED ORDER — ACETAMINOPHEN 325 MG PO TABS: 650.0000 mg | ORAL_TABLET | Freq: Four times a day (QID) | ORAL | Status: DC | PRN

## 2013-02-08 MED ORDER — ONDANSETRON HCL 4 MG/2ML IJ SOLN
4.0000 mg | Freq: Once | INTRAMUSCULAR | Status: AC
  Administered 2013-02-08: 4 mg via INTRAVENOUS
  Filled 2013-02-08: qty 2

## 2013-02-08 MED ORDER — DEXAMETHASONE SODIUM PHOSPHATE 10 MG/ML IJ SOLN
INTRAMUSCULAR | Status: DC | PRN
  Administered 2013-02-08: 10 mg via INTRAVENOUS

## 2013-02-08 MED ORDER — HYDROCODONE-ACETAMINOPHEN 5-325 MG PO TABS
1.0000 | ORAL_TABLET | ORAL | Status: DC | PRN
  Administered 2013-02-09 (×2): 2 via ORAL
  Filled 2013-02-08 (×2): qty 2

## 2013-02-08 MED ORDER — LIDOCAINE HCL (CARDIAC) 20 MG/ML IV SOLN
INTRAVENOUS | Status: AC
  Filled 2013-02-08: qty 5

## 2013-02-08 MED ORDER — SODIUM CHLORIDE 0.9 % IV SOLN
3.0000 g | Freq: Four times a day (QID) | INTRAVENOUS | Status: DC
  Administered 2013-02-09 (×2): 3 g via INTRAVENOUS
  Filled 2013-02-08 (×4): qty 3

## 2013-02-08 MED ORDER — MORPHINE SULFATE 4 MG/ML IJ SOLN
4.0000 mg | Freq: Once | INTRAMUSCULAR | Status: AC
  Administered 2013-02-08: 4 mg via INTRAVENOUS
  Filled 2013-02-08: qty 1

## 2013-02-08 MED ORDER — SUCCINYLCHOLINE CHLORIDE 20 MG/ML IJ SOLN
INTRAMUSCULAR | Status: AC
  Filled 2013-02-08: qty 1

## 2013-02-08 MED ORDER — FENTANYL CITRATE 0.05 MG/ML IJ SOLN
INTRAMUSCULAR | Status: DC | PRN
  Administered 2013-02-08 (×2): 50 ug via INTRAVENOUS
  Administered 2013-02-08: 100 ug via INTRAVENOUS
  Administered 2013-02-09: 50 ug via INTRAVENOUS
  Administered 2013-02-09: 100 ug via INTRAVENOUS

## 2013-02-08 MED ORDER — HYDROMORPHONE HCL PF 1 MG/ML IJ SOLN
1.0000 mg | Freq: Once | INTRAMUSCULAR | Status: AC
  Administered 2013-02-08: 1 mg via INTRAVENOUS
  Filled 2013-02-08: qty 1

## 2013-02-08 MED ORDER — HYDROMORPHONE HCL PF 1 MG/ML IJ SOLN
1.0000 mg | INTRAMUSCULAR | Status: DC | PRN
  Administered 2013-02-09: 1 mg via INTRAVENOUS
  Filled 2013-02-08: qty 1

## 2013-02-08 MED ORDER — SODIUM CHLORIDE 0.9 % IV BOLUS (SEPSIS)
500.0000 mL | Freq: Once | INTRAVENOUS | Status: AC
  Administered 2013-02-08: 500 mL via INTRAVENOUS

## 2013-02-08 MED ORDER — ONDANSETRON HCL 4 MG/2ML IJ SOLN
INTRAMUSCULAR | Status: AC
  Filled 2013-02-08: qty 2

## 2013-02-08 MED ORDER — SODIUM CHLORIDE 0.9 % IV SOLN
3.0000 g | Freq: Once | INTRAVENOUS | Status: AC
  Administered 2013-02-08: 3 g via INTRAVENOUS
  Filled 2013-02-08 (×2): qty 3

## 2013-02-08 MED ORDER — BUPIVACAINE-EPINEPHRINE 0.25% -1:200000 IJ SOLN
INTRAMUSCULAR | Status: AC
  Filled 2013-02-08: qty 1

## 2013-02-08 MED ORDER — IOHEXOL 300 MG/ML  SOLN
25.0000 mL | Freq: Once | INTRAMUSCULAR | Status: AC | PRN
  Administered 2013-02-08: 25 mL via ORAL

## 2013-02-08 SURGICAL SUPPLY — 39 items
APL SKNCLS STERI-STRIP NONHPOA (GAUZE/BANDAGES/DRESSINGS) ×1
APPLIER CLIP ROT 10 11.4 M/L (STAPLE)
APR CLP MED LRG 11.4X10 (STAPLE)
BAG SPEC RTRVL LRG 6X4 10 (ENDOMECHANICALS) ×1
BENZOIN TINCTURE PRP APPL 2/3 (GAUZE/BANDAGES/DRESSINGS) ×2 IMPLANT
CANISTER SUCTION 2500CC (MISCELLANEOUS) ×2 IMPLANT
CLIP APPLIE ROT 10 11.4 M/L (STAPLE) IMPLANT
CUTTER FLEX LINEAR 45M (STAPLE) ×1 IMPLANT
DECANTER SPIKE VIAL GLASS SM (MISCELLANEOUS) ×2 IMPLANT
DRAPE LAPAROSCOPIC ABDOMINAL (DRAPES) ×2 IMPLANT
ELECT REM PT RETURN 9FT ADLT (ELECTROSURGICAL) ×2
ELECTRODE REM PT RTRN 9FT ADLT (ELECTROSURGICAL) ×1 IMPLANT
ENDOLOOP SUT PDS II  0 18 (SUTURE)
ENDOLOOP SUT PDS II 0 18 (SUTURE) IMPLANT
GLOVE BIOGEL PI IND STRL 7.0 (GLOVE) ×1 IMPLANT
GLOVE BIOGEL PI INDICATOR 7.0 (GLOVE) ×1
GLOVE SURG ORTHO 8.0 STRL STRW (GLOVE) ×2 IMPLANT
GOWN PREVENTION PLUS LG XLONG (DISPOSABLE) ×2 IMPLANT
GOWN STRL REIN XL XLG (GOWN DISPOSABLE) ×4 IMPLANT
KIT BASIN OR (CUSTOM PROCEDURE TRAY) ×2 IMPLANT
PENCIL BUTTON HOLSTER BLD 10FT (ELECTRODE) IMPLANT
POUCH SPECIMEN RETRIEVAL 10MM (ENDOMECHANICALS) ×1 IMPLANT
RELOAD 45 VASCULAR/THIN (ENDOMECHANICALS) ×2 IMPLANT
RELOAD STAPLE 45 2.5 WHT GRN (ENDOMECHANICALS) IMPLANT
RELOAD STAPLE 45 3.5 BLU ETS (ENDOMECHANICALS) IMPLANT
RELOAD STAPLE TA45 3.5 REG BLU (ENDOMECHANICALS) ×2 IMPLANT
SCALPEL HARMONIC ACE (MISCELLANEOUS) IMPLANT
SET IRRIG TUBING LAPAROSCOPIC (IRRIGATION / IRRIGATOR) ×1 IMPLANT
SOLUTION ANTI FOG 6CC (MISCELLANEOUS) ×2 IMPLANT
STRIP CLOSURE SKIN 1/2X4 (GAUZE/BANDAGES/DRESSINGS) ×2 IMPLANT
SUT MNCRL AB 4-0 PS2 18 (SUTURE) ×2 IMPLANT
TOWEL OR 17X26 10 PK STRL BLUE (TOWEL DISPOSABLE) ×2 IMPLANT
TRAY FOLEY CATH 14FRSI W/METER (CATHETERS) ×2 IMPLANT
TRAY LAP CHOLE (CUSTOM PROCEDURE TRAY) ×2 IMPLANT
TROCAR BLADELESS OPT 5 75 (ENDOMECHANICALS) ×2 IMPLANT
TROCAR XCEL BLUNT TIP 100MML (ENDOMECHANICALS) ×2 IMPLANT
TROCAR XCEL NON-BLD 11X100MML (ENDOMECHANICALS) ×2 IMPLANT
TUBING INSUFFLATION 10FT LAP (TUBING) ×2 IMPLANT
WATER STERILE IRR 1500ML POUR (IV SOLUTION) ×2 IMPLANT

## 2013-02-08 NOTE — ED Provider Notes (Signed)
CSN: [id number]     Arrival date & time 02/08/13  1939 History   First MD Initiated Contact with Patient 02/08/13 1941     Chief Complaint  Patient presents with  . Abdominal Pain   (Consider location/radiation/quality/duration/timing/severity/associated sxs/prior Treatment) HPI Comments: Pt is a 27 y/o male who presents to the ED complaining of sudden onset abdominal pain beginning earlier this morning around 8:00. Pain located on R side, constant 10/10. Admits to associated nausea and vomiting beginning around 9:00 am, and has had non-bloody emesis every hour after. Has cold chills, subjective fever, decreased appetite. No aggravating/alleviating factors. Denies diarrhea or urinary changes. No history of abdominal surgeries.  The history is provided by the patient and a parent.    Past Medical History  Diagnosis Date  . Allergic rhinitis   . ADHD (attention deficit hyperactivity disorder)    Past Surgical History  Procedure Laterality Date  . Wisdom tooth extraction     Family History  Problem Relation Age of Onset  . Allergic rhinitis Mother    History  Substance Use Topics  . Smoking status: Former Smoker -- 0.50 packs/day for 3 years    Types: Cigarettes    Quit date: 02/26/2008  . Smokeless tobacco: Never Used  . Alcohol Use: No    Review of Systems  Constitutional: Positive for fever, chills and appetite change.  Gastrointestinal: Positive for nausea, vomiting and abdominal pain.  All other systems reviewed and are negative.    Allergies  Review of patient's allergies indicates no known allergies.  Home Medications   Current Outpatient Rx  Name  Route  Sig  Dispense  Refill  . clonazePAM (KLONOPIN) 0.5 MG tablet   Oral   Take 0.5 mg by mouth daily as needed for anxiety.         Marland Kitchen escitalopram (LEXAPRO) 10 MG tablet   Oral   Take 10 mg by mouth daily.         . valACYclovir (VALTREX) 500 MG tablet   Oral   Take 500 mg by mouth daily as needed  (outbreak.).           BP 134/84  Pulse 84  Temp(Src) 98.7 F (37.1 C) (Oral)  Resp 26  Wt 186 lb (84.369 kg)  SpO2 100% Physical Exam  Nursing note and vitals reviewed. Constitutional: He is oriented to person, place, and time. He appears well-developed and well-nourished. No distress.  Shaking chills.  HENT:  Head: Normocephalic and atraumatic.  Mouth/Throat: Oropharynx is clear and moist.  Eyes: Conjunctivae are normal.  Neck: Normal range of motion. Neck supple.  Cardiovascular: Normal rate, regular rhythm and normal heart sounds.   Pulmonary/Chest: Effort normal and breath sounds normal.  Abdominal: Soft. Normal appearance and bowel sounds are normal. He exhibits no distension. There is tenderness in the right upper quadrant and right lower quadrant. There is rebound, guarding and tenderness at McBurney's point. There is no rigidity and negative Murphy's sign.  No peritoneal signs.  Musculoskeletal: Normal range of motion. He exhibits no edema.  Neurological: He is alert and oriented to person, place, and time.  Skin: Skin is warm and dry. He is not diaphoretic.  Psychiatric: He has a normal mood and affect. His behavior is normal.    ED Course  Procedures (including critical care time) Labs Review Labs Reviewed  CBC WITH DIFFERENTIAL - Abnormal; Notable for the following:    WBC 23.0 (*)    MCHC 36.4 (*)    Neutrophils  Relative % 90 (*)    Neutro Abs 20.7 (*)    Lymphocytes Relative 6 (*)    All other components within normal limits  COMPREHENSIVE METABOLIC PANEL - Abnormal; Notable for the following:    Glucose, Bld 119 (*)    Total Bilirubin 1.5 (*)    All other components within normal limits  LIPASE, BLOOD   Imaging Review Ct Abdomen Pelvis W Contrast  02/08/2013   CLINICAL DATA:  Lower abdominal pain  EXAM: CT ABDOMEN AND PELVIS WITH CONTRAST  TECHNIQUE: Multidetector CT imaging of the abdomen and pelvis was performed using the standard protocol following  bolus administration of intravenous contrast. Oral contrast was also administered.  CONTRAST:  OMNIPAQUE IOHEXOL 300 MG/ML  SOLN  COMPARISON:  None.  FINDINGS: There is mild posterior bibasilar lung atelectasis. Lung bases are otherwise clear.  Liver is prominent, measuring 18.0 cm in length. There is fatty change throughout the liver. No focal liver lesions are identified. There is no biliary duct dilatation. Gallbladder wall is not thickened.  Spleen, pancreas, and adrenals appear normal.  Kidneys bilaterally show no mass or hydronephrosis on either side. There is no ureteral calculus or ureterectasis on either side.  In the pelvis, the urinary bladder is midline. There is no pelvic mass or fluid collection.  The appendix is enlarged with surrounding mesenteric thickening consistent with acute appendicitis. There is no abscess in this area.  No bowel obstruction. No free air or portal venous air. There are left colonic diverticula without diverticulitis.  There is no ascites, adenopathy, or abscess in the abdomen or pelvis. Aorta is nonaneurysmal. There are no blastic or lytic bone lesions.  IMPRESSION: Findings consistent with acute appendiceal inflammation. No abscess.  No bowel obstruction. There are left colonic diverticula without apparent diverticulitis.  Liver is enlarged with fatty change.  Critical Value/emergent results were called by telephone at the time of interpretation on 02/08/2013 at 9:27 PM to [facility] , who verbally acknowledged these results. 1   Electronically Signed   By: Bretta Bang M.D.   On: 02/08/2013 21:29    EKG Interpretation   None       MDM   1. Acute appendicitis     Pt presenting with R sided abdominal pain, n/v. Tenderness at McBurney's point with rebound and guarding. He is afebrile, appears uncomfortable but is in no apparent distress. Possible appendicitis. Labs pending. CT with contrast. 9:40 PM Leukocytosis of 23, CT scan findings consistent  with acute appendicitis, no abscess or perforation. Surgery consult appreciated, spoke with Dr. Georgana Curio who will evaluate patient for admission. Case discussed with attending Dr. Gwendolyn Grant who agrees with plan of care.   Trevor Mace, PA-C 02/08/13 2140

## 2013-02-08 NOTE — ED Provider Notes (Signed)
Medical screening examination/treatment/procedure(s) were conducted as a shared visit with non-physician practitioner(s) and myself.  I personally evaluated the patient during the encounter.  EKG Interpretation   None        43M presents with abdominal pain. R sided pain with assocaited N/V. Belly with R sided tenderness, no peritoneal signs. Scan shows appendicitis. Surgery admiiting.   Dagmar Hait, MD 02/08/13 [phone]

## 2013-02-08 NOTE — Anesthesia Preprocedure Evaluation (Signed)
Anesthesia Evaluation  Patient identified by MRN, date of birth, ID band Patient awake    Reviewed: Allergy & Precautions, H&P , NPO status , Patient's Chart, lab work & pertinent test results  Airway Mallampati: II TM Distance: >3 FB Neck ROM: full    Dental no notable dental hx. (+) Teeth Intact and Dental Advisory Given   Pulmonary neg pulmonary ROS, former smoker,  breath sounds clear to auscultation  Pulmonary exam normal       Cardiovascular Exercise Tolerance: Good negative cardio ROS  Rhythm:regular Rate:Normal     Neuro/Psych negative neurological ROS  negative psych ROS   GI/Hepatic negative GI ROS, Neg liver ROS,   Endo/Other  negative endocrine ROS  Renal/GU negative Renal ROS  negative genitourinary   Musculoskeletal   Abdominal   Peds  Hematology negative hematology ROS (+)   Anesthesia Other Findings   Reproductive/Obstetrics negative OB ROS                           Anesthesia Physical Anesthesia Plan  ASA: I and emergent  Anesthesia Plan: General   Post-op Pain Management:    Induction: Intravenous, Rapid sequence and Cricoid pressure planned  Airway Management Planned: Oral ETT  Additional Equipment:   Intra-op Plan:   Post-operative Plan: Extubation in OR  Informed Consent: I have reviewed the patients History and Physical, chart, labs and discussed the procedure including the risks, benefits and alternatives for the proposed anesthesia with the patient or authorized representative who has indicated his/her understanding and acceptance.   Dental Advisory Given  Plan Discussed with: CRNA and Surgeon  Anesthesia Plan Comments:         Anesthesia Quick Evaluation

## 2013-02-08 NOTE — Preoperative (Signed)
Beta Blockers   Reason not to administer Beta Blockers:Not Applicable 

## 2013-02-08 NOTE — H&P (Signed)
Terry [name] is an 27 y.o. male.    General Surgery [facility]  Chief Complaint: abdominal pain, acute appendicitis  HPI: patient is a 27 year old male who presents to the emergency department with less than 24-hour history of abdominal pain localized to the right mid abdomen. Patient had relatively sudden onset of severe abdominal pain approximately 8 AM on the day of admission. Patient developed nausea and vomiting. Pain persisted. He presented to the emergency department for evaluation. Laboratory studies show an elevated white blood cell count of 23,000. CT scan abdomen and pelvis shows findings consistent with acute appendicitis without perforation or abscess.  Patient has had no prior abdominal surgery. He is accompanied by his mother.  Past Medical History  Diagnosis Date  . Allergic rhinitis   . ADHD (attention deficit hyperactivity disorder)     Past Surgical History  Procedure Laterality Date  . Wisdom tooth extraction      Family History  Problem Relation Age of Onset  . Allergic rhinitis Mother    Social History:  reports that he quit smoking about 4 years ago. His smoking use included Cigarettes. He has a 1.5 pack-year smoking history. He has never used smokeless tobacco. He reports that he does not drink alcohol or use illicit drugs.  Allergies: No Known Allergies   (Not in a hospital admission)  Results for orders placed during the hospital encounter of 02/08/13 (from the past 48 hour(s))  CBC WITH DIFFERENTIAL     Status: Abnormal   Collection Time    02/08/13  8:10 PM      Result Value Range   WBC 23.0 (*) 4.0 - 10.5 K/uL   RBC 5.25  4.22 - 5.81 MIL/uL   Hemoglobin 16.6  13.0 - 17.0 g/dL   HCT 82.9  56.2 - 13.0 %   MCV 86.9  78.0 - 100.0 fL   MCH 31.6  26.0 - 34.0 pg   MCHC 36.4 (*) 30.0 - 36.0 g/dL   RDW 86.5  78.4 - 69.6 %   Platelets 208  150 - 400 K/uL   Neutrophils Relative % 90 (*) 43 - 77 %   Neutro Abs 20.7 (*) 1.7 - 7.7  K/uL   Lymphocytes Relative 6 (*) 12 - 46 %   Lymphs Abs 1.4  0.7 - 4.0 K/uL   Monocytes Relative 4  3 - 12 %   Monocytes Absolute 0.9  0.1 - 1.0 K/uL   Eosinophils Relative 0  0 - 5 %   Eosinophils Absolute 0.0  0.0 - 0.7 K/uL   Basophils Relative 0  0 - 1 %   Basophils Absolute 0.0  0.0 - 0.1 K/uL  COMPREHENSIVE METABOLIC PANEL     Status: Abnormal   Collection Time    02/08/13  8:10 PM      Result Value Range   Sodium 136  135 - 145 mEq/L   Potassium 3.7  3.5 - 5.1 mEq/L   Chloride 98  96 - 112 mEq/L   CO2 22  19 - 32 mEq/L   Glucose, Bld 119 (*) 70 - 99 mg/dL   BUN 15  6 - 23 mg/dL   Creatinine, Ser 2.95  0.50 - 1.35 mg/dL   Calcium 28.4  8.4 - 13.2 mg/dL   Total Protein 7.9  6.0 - 8.3 g/dL   Albumin 4.9  3.5 - 5.2 g/dL   AST 17  0 - 37 U/L   ALT 22  0 -  53 U/L   Alkaline Phosphatase 50  39 - 117 U/L   Total Bilirubin 1.5 (*) 0.3 - 1.2 mg/dL   GFR calc non Af Amer >90  >90 mL/min   GFR calc Af Amer >90  >90 mL/min   Comment: (NOTE)     The eGFR has been calculated using the CKD EPI equation.     This calculation has not been validated in all clinical situations.     eGFR's persistently <90 mL/min signify possible Chronic Kidney     Disease.  LIPASE, BLOOD     Status: None   Collection Time    02/08/13  8:10 PM      Result Value Range   Lipase 20  11 - 59 U/L   Ct Abdomen Pelvis W Contrast  02/08/2013   CLINICAL DATA:  Lower abdominal pain  EXAM: CT ABDOMEN AND PELVIS WITH CONTRAST  TECHNIQUE: Multidetector CT imaging of the abdomen and pelvis was performed using the standard protocol following bolus administration of intravenous contrast. Oral contrast was also administered.  CONTRAST:  OMNIPAQUE IOHEXOL 300 MG/ML  SOLN  COMPARISON:  None.  FINDINGS: There is mild posterior bibasilar lung atelectasis. Lung bases are otherwise clear.  Liver is prominent, measuring 18.0 cm in length. There is fatty change throughout the liver. No focal liver lesions are identified.  There is no biliary duct dilatation. Gallbladder wall is not thickened.  Spleen, pancreas, and adrenals appear normal.  Kidneys bilaterally show no mass or hydronephrosis on either side. There is no ureteral calculus or ureterectasis on either side.  In the pelvis, the urinary bladder is midline. There is no pelvic mass or fluid collection.  The appendix is enlarged with surrounding mesenteric thickening consistent with acute appendicitis. There is no abscess in this area.  No bowel obstruction. No free air or portal venous air. There are left colonic diverticula without diverticulitis.  There is no ascites, adenopathy, or abscess in the abdomen or pelvis. Aorta is nonaneurysmal. There are no blastic or lytic bone lesions.  IMPRESSION: Findings consistent with acute appendiceal inflammation. No abscess.  No bowel obstruction. There are left colonic diverticula without apparent diverticulitis.  Liver is enlarged with fatty change.  Critical Value/emergent results were called by telephone at the time of interpretation on 02/08/2013 at 9:27 PM to [facility] , who verbally acknowledged these results. 1   Electronically Signed   By: Bretta Bang M.D.   On: 02/08/2013 21:29    Review of Systems  Constitutional: Positive for fever, chills and diaphoresis. Negative for weight loss and malaise/fatigue.  HENT: Negative.   Eyes: Negative.   Respiratory: Negative.   Cardiovascular: Negative.   Gastrointestinal: Positive for nausea, vomiting and abdominal pain. Negative for heartburn, diarrhea, constipation, blood in stool and melena.  Genitourinary: Negative.   Musculoskeletal: Negative.   Skin: Negative.   Neurological: Negative.  Negative for weakness.  Endo/Heme/Allergies: Negative.   Psychiatric/Behavioral: Negative.     Blood pressure 134/84, pulse 84, temperature 98.7 F (37.1 C), temperature source Oral, resp. rate 26, weight 186 lb (84.369 kg), SpO2 100.00%. Physical Exam  Constitutional: He  is oriented to person, place, and time. He appears well-developed and well-nourished. No distress.  HENT:  Head: Normocephalic and atraumatic.  Right Ear: External ear normal.  Left Ear: External ear normal.  Mouth/Throat: No oropharyngeal exudate.  Eyes: Conjunctivae are normal. Pupils are equal, round, and reactive to light. No scleral icterus.  Neck: Normal range of motion. No thyromegaly present.  Cardiovascular: Normal rate, regular rhythm and normal heart sounds.   No murmur heard. Respiratory: Effort normal and breath sounds normal. He has no wheezes.  GI: Soft. Bowel sounds are normal. He exhibits no distension and no mass. There is tenderness (RLQ). There is guarding. There is no rebound.  Musculoskeletal: Normal range of motion. He exhibits no edema.  Lymphadenopathy:    He has no cervical adenopathy.  Neurological: He is alert and oriented to person, place, and time.  Skin: Skin is warm and dry. He is not diaphoretic.  Psychiatric: He has a normal mood and affect. His behavior is normal.     Assessment/Plan Acute appendicitis  Admit to General Surgery Service  To OR tonight for appendectomy  IV Unasyn given in ER  The risks and benefits of the procedure have been discussed at length with the patient.  The patient understands the proposed procedure, potential alternative treatments, and the course of recovery to be expected.  All of the patient's questions have been answered at this time.  The patient wishes to proceed with surgery.  Velora Heckler, MD, [facility] Office: [phone]    Win Guajardo M 02/08/2013, 10:37 PM

## 2013-02-08 NOTE — ED Notes (Signed)
Pt states he has had 10/10 pain in his lower abdomen since 8am this morning. He states he threw up at 9am and then every hour after.  Pt also has nausea, vomiting and cold chills

## 2013-02-09 ENCOUNTER — Encounter (HOSPITAL_COMMUNITY): Payer: Self-pay | Admitting: Surgery

## 2013-02-09 MED ORDER — ONDANSETRON HCL 4 MG/2ML IJ SOLN
INTRAMUSCULAR | Status: DC | PRN
  Administered 2013-02-09: 4 mg via INTRAVENOUS

## 2013-02-09 MED ORDER — BUPIVACAINE-EPINEPHRINE 0.25% -1:200000 IJ SOLN
INTRAMUSCULAR | Status: DC | PRN
  Administered 2013-02-09: 20 mL

## 2013-02-09 MED ORDER — DEXAMETHASONE SODIUM PHOSPHATE 10 MG/ML IJ SOLN
INTRAMUSCULAR | Status: AC
  Filled 2013-02-09: qty 1

## 2013-02-09 MED ORDER — SODIUM CHLORIDE 0.9 % IV SOLN: 3.0000 g | Freq: Four times a day (QID) | INTRAVENOUS | Status: DC

## 2013-02-09 MED ORDER — KETOROLAC TROMETHAMINE 15 MG/ML IJ SOLN
15.0000 mg | Freq: Four times a day (QID) | INTRAMUSCULAR | Status: DC | PRN
  Administered 2013-02-09: 15 mg via INTRAVENOUS
  Filled 2013-02-09: qty 1

## 2013-02-09 MED ORDER — GLYCOPYRROLATE 0.2 MG/ML IJ SOLN
INTRAMUSCULAR | Status: DC | PRN
  Administered 2013-02-09: .6 mg via INTRAVENOUS

## 2013-02-09 MED ORDER — 0.9 % SODIUM CHLORIDE (POUR BTL) OPTIME
TOPICAL | Status: DC | PRN
  Administered 2013-02-09: 1000 mL

## 2013-02-09 MED ORDER — KCL IN DEXTROSE-NACL 20-5-0.45 MEQ/L-%-% IV SOLN
INTRAVENOUS | Status: DC
  Administered 2013-02-09: 02:00:00 via INTRAVENOUS
  Filled 2013-02-09 (×3): qty 1000

## 2013-02-09 MED ORDER — HYDROMORPHONE HCL PF 1 MG/ML IJ SOLN
0.2500 mg | INTRAMUSCULAR | Status: DC | PRN
  Administered 2013-02-09: 0.5 mg via INTRAVENOUS

## 2013-02-09 MED ORDER — SUCCINYLCHOLINE CHLORIDE 20 MG/ML IJ SOLN
INTRAMUSCULAR | Status: DC | PRN
  Administered 2013-02-08: 100 mg via INTRAVENOUS

## 2013-02-09 MED ORDER — HYDROCODONE-ACETAMINOPHEN 5-325 MG PO TABS: 1.0000 | ORAL_TABLET | ORAL | Status: DC | PRN

## 2013-02-09 MED ORDER — LIDOCAINE HCL (CARDIAC) 20 MG/ML IV SOLN
INTRAVENOUS | Status: DC | PRN
  Administered 2013-02-08: 100 mg via INTRAVENOUS

## 2013-02-09 MED ORDER — LACTATED RINGERS IV SOLN
INTRAVENOUS | Status: DC
  Administered 2013-02-09: 01:00:00 via INTRAVENOUS

## 2013-02-09 MED ORDER — HYDROMORPHONE HCL PF 1 MG/ML IJ SOLN
INTRAMUSCULAR | Status: AC
  Administered 2013-02-09: 02:00:00
  Filled 2013-02-09: qty 1

## 2013-02-09 MED ORDER — NEOSTIGMINE METHYLSULFATE 1 MG/ML IJ SOLN
INTRAMUSCULAR | Status: DC | PRN
  Administered 2013-02-09: 4 mg via INTRAVENOUS

## 2013-02-09 MED ORDER — NEOSTIGMINE METHYLSULFATE 1 MG/ML IJ SOLN
INTRAMUSCULAR | Status: AC
  Filled 2013-02-09: qty 10

## 2013-02-09 MED ORDER — LACTATED RINGERS IR SOLN
Status: DC | PRN
  Administered 2013-02-09: 1000 mL

## 2013-02-09 MED ORDER — FENTANYL CITRATE 0.05 MG/ML IJ SOLN
INTRAMUSCULAR | Status: AC
  Filled 2013-02-09: qty 2

## 2013-02-09 MED ORDER — GLYCOPYRROLATE 0.2 MG/ML IJ SOLN
INTRAMUSCULAR | Status: AC
  Filled 2013-02-09: qty 3

## 2013-02-09 MED ORDER — PROPOFOL 10 MG/ML IV BOLUS
INTRAVENOUS | Status: DC | PRN
  Administered 2013-02-08: 200 mg via INTRAVENOUS

## 2013-02-09 NOTE — Transfer of Care (Signed)
Immediate Anesthesia Transfer of Care Note  Patient: Terry [name]  Procedure(s) Performed: Procedure(s): APPENDECTOMY LAPAROSCOPIC (N/A)  Patient Location: PACU  Anesthesia Type:General  Level of Consciousness: awake, alert  and oriented  Airway & Oxygen Therapy: Patient Spontanous Breathing and Patient connected to nasal cannula oxygen  Post-op Assessment: Report given to PACU RN and Post -op Vital signs reviewed and stable  Post vital signs: Reviewed and stable  Complications: No apparent anesthesia complications

## 2013-02-09 NOTE — Discharge Summary (Signed)
Patient ID: Terry [name] MRN: [id number] DOB/AGE: 1985-04-02 27 y.o.  Admit date: 02/08/2013 Discharge date: 02/09/2013  Procedures: lap appy  Consults: None  Reason for Admission: patient is a 27 year old male who presents to the emergency department with less than 24-hour history of abdominal pain localized to the right mid abdomen. Patient had relatively sudden onset of severe abdominal pain approximately 8 AM on the day of admission. Patient developed nausea and vomiting. Pain persisted. He presented to the emergency department for evaluation. Laboratory studies show an elevated white blood cell count of 23,000. CT scan abdomen and pelvis shows findings consistent with acute appendicitis without perforation or abscess.  Admission Diagnoses:  1. ADHD 2. Acute appendicitis  Hospital Course: the patient was admitted and taken to the OR where he underwent a lap appy.  He tolerated the procedure well.  On POD 1, he is tolerating his diet and his pain is controlled.  He is stable for dc home.  PE: Abd: soft, appropriately tender, +BS, ND, incisions covered with gauze and tape.  Otherwise clean  Discharge Diagnoses:  Principal Problem:   Appendicitis, acute Active Problems:   Acute appendicitis s/p lap appy  Discharge Medications:   Medication List         clonazePAM 0.5 MG tablet  Commonly known as:  KLONOPIN  Take 0.5 mg by mouth daily as needed for anxiety.     escitalopram 10 MG tablet  Commonly known as:  LEXAPRO  Take 10 mg by mouth daily.     HYDROcodone-acetaminophen 5-325 MG per tablet  Commonly known as:  NORCO/VICODIN  Take 1-2 tablets by mouth every 4 (four) hours as needed for moderate pain.     valACYclovir 500 MG tablet  Commonly known as:  VALTREX  Take 500 mg by mouth daily as needed (outbreak.).        Discharge Instructions:     Follow-up Information   Follow up with [facility] On 03/02/2013. (2:30pm, arrive by 2:00pm for  paperwork)    Contact information:   [address]   [place] [address] [phone]       Signed: Letha Cape 02/09/2013, 9:53 AM  Agree with above.  Ovidio Kin, MD, [facility] Pager: [phone] Office phone:  [phone]

## 2013-02-09 NOTE — Anesthesia Procedure Notes (Signed)
Procedure Name: Intubation Date/Time: 02/08/2013 11:54 PM Performed by: Leroy Libman L Patient Re-evaluated:Patient Re-evaluated prior to inductionOxygen Delivery Method: Circle system utilized Preoxygenation: Pre-oxygenation with 100% oxygen Intubation Type: IV induction, Cricoid Pressure applied and Rapid sequence Laryngoscope Size: Miller and 3 Grade View: Grade I Tube type: Oral Tube size: 8.0 mm Number of attempts: 1 Airway Equipment and Method: Stylet Placement Confirmation: ETT inserted through vocal cords under direct vision,  breath sounds checked- equal and bilateral and positive ETCO2 Secured at: 21 cm Tube secured with: Tape Dental Injury: Teeth and Oropharynx as per pre-operative assessment

## 2013-02-09 NOTE — Anesthesia Postprocedure Evaluation (Signed)
  Anesthesia Post-op Note  Patient: Terry [name]  Procedure(s) Performed: Procedure(s) (LRB): APPENDECTOMY LAPAROSCOPIC (N/A)  Patient Location: PACU  Anesthesia Type: General  Level of Consciousness: awake and alert   Airway and Oxygen Therapy: Patient Spontanous Breathing  Post-op Pain: mild  Post-op Assessment: Post-op Vital signs reviewed, Patient's Cardiovascular Status Stable, Respiratory Function Stable, Patent Airway and No signs of Nausea or vomiting  Last Vitals:  Filed Vitals:   02/09/13 0045  BP:   Pulse: 83  Temp: 37.8 C  Resp: 16    Post-op Vital Signs: stable   Complications: No apparent anesthesia complications

## 2013-02-09 NOTE — Op Note (Signed)
OPERATIVE REPORT - LAPAROSCOPIC APPENDECTOMY  Preop diagnosis: Acute appendicitis  Postop diagnosis: Same  Procedure: Laparoscopic appendectomy  Surgeon:  Velora Heckler, MD, FACS  Anesthesia: General endotracheal  Estimated blood loss: Minimal  Preparation: Chlora-prep  Complications: None  Indications:  Patient is a 27 yo WM with less than 24 hr hx of abdominal pain localized to the RLQ.  CT scan shows acute appendicitis.  WBC elevated at 23K.  Procedure:  Patient is brought to the operating room and placed in a supine position on the operating room table. Following administration of general anesthesia, a time out was held and the patient's name and type of procedure is confirmed. Patient is then prepped and draped in the usual strict aseptic fashion.  After ascertaining that an adequate level of anesthesia been achieved, a peri-umbilical incision is made with a #15 blade. Dissection is carried down to the fascia. Fascia is incised in the midline and the peritoneal cavity is entered cautiously. A #0-vicryl pursestring suture is placed in the fascia. An Hassan cannula is introduced under direct vision and secured with the pursestring suture. Abdomen is insufflated with carbon dioxide. Laparoscope is introduced and the abdomen is explored. Operative ports are placed in the right upper quadrant and left lower quadrant. Appendix is identified. Mesoappendix is divided with the harmonic scalpel. Dissection is carried down to the base of the appendix. The base of the appendix at its junction with the cecal wall was clearly identified. Using an Endo GIA stapler the base of the appendix is transected at its junction with the cecal wall. There is good approximation of tissue along the staple line. There is good hemostasis along the staple line. The appendix is placed into an endo-catch bag and withdrawn through the umbilical port without difficulty. The #0-vicryl pursestring suture is tied  securely.  Right lower quadrant is irrigated with warm saline which is evacuated. Good hemostasis is noted. Ports are removed under direct vision. Good hemostasis is noted at the port sites. Pneumoperitoneum is released.  Skin incisions are anesthetized with local anesthetic. Wounds are closed with interrupted 4-0 Monocryl subcuticular sutures. Wounds were washed and dried and benzoin and Steri-Strips are applied. Sterile dressings are applied. Patient is awakened from anesthesia and brought to the recovery room. The patient tolerated the procedure well.  Velora Heckler, MD, [facility] Office: [phone]

## 2013-03-02 ENCOUNTER — Encounter (INDEPENDENT_AMBULATORY_CARE_PROVIDER_SITE_OTHER): Payer: Self-pay

## 2013-03-02 ENCOUNTER — Ambulatory Visit (INDEPENDENT_AMBULATORY_CARE_PROVIDER_SITE_OTHER): Payer: BC Managed Care – PPO | Admitting: General Surgery

## 2013-03-02 VITALS — BP 108/70 | HR 92 | Temp 98.6°F | Resp 15 | Ht 71.0 in | Wt 183.6 lb

## 2013-03-02 DIAGNOSIS — Z9049 Acquired absence of other specified parts of digestive tract: Secondary | ICD-10-CM

## 2013-03-02 DIAGNOSIS — Z9889 Other specified postprocedural states: Secondary | ICD-10-CM

## 2013-03-02 DIAGNOSIS — K3580 Unspecified acute appendicitis: Secondary | ICD-10-CM

## 2013-03-02 NOTE — Patient Instructions (Signed)
He may resume a regular diet and full activity.  He may follow-up on a PRN basis.  Slowly return to exercise activity.

## 2013-03-02 NOTE — Progress Notes (Signed)
  Subjective: Terry [name] is a 28 y.o. male who had a laparoscopic appendectomy with on 02/09/13 by Dr. Gerrit FriendsGerkin.  The pathology report confirmed acute appendicitis with serositis.  The patient is tolerating their diet well and is having no severe pain.  Bowel function is good.  No problems with the wounds.  Pt is returning to normal activity / work.   Objective: Vital signs in last 24 hours: Reviewed   PE: General:  Alert, NAD, pleasant Abd: soft, NT/ND, +bs, incisions appear well-healed with no sign of infection or bleeding.     Assessment/Plan  1.  S/P Laparoscopic Appendectomy: doing well, may resume regular activity without restrictions, Pt will follow up with us PRN and knows to call with questions or concerns.      Aris GeorgiaDORT, Gaither Biehn, PA-C 03/02/2013

## 2013-03-18 ENCOUNTER — Encounter: Payer: Self-pay | Admitting: Internal Medicine

## 2013-12-30 ENCOUNTER — Encounter: Payer: Self-pay | Admitting: Internal Medicine

## 2016-03-27 DIAGNOSIS — R197 Diarrhea, unspecified: Secondary | ICD-10-CM | POA: Diagnosis not present

## 2016-03-27 DIAGNOSIS — Z0000 Encounter for general adult medical examination without abnormal findings: Secondary | ICD-10-CM | POA: Diagnosis not present

## 2016-03-27 DIAGNOSIS — Z1389 Encounter for screening for other disorder: Secondary | ICD-10-CM | POA: Diagnosis not present

## 2016-03-27 DIAGNOSIS — Z6828 Body mass index (BMI) 28.0-28.9, adult: Secondary | ICD-10-CM | POA: Diagnosis not present

## 2016-03-27 DIAGNOSIS — B356 Tinea cruris: Secondary | ICD-10-CM | POA: Diagnosis not present

## 2016-04-03 DIAGNOSIS — Z0000 Encounter for general adult medical examination without abnormal findings: Secondary | ICD-10-CM | POA: Diagnosis not present

## 2016-04-03 DIAGNOSIS — R740 Nonspecific elevation of levels of transaminase and lactic acid dehydrogenase [LDH]: Secondary | ICD-10-CM | POA: Diagnosis not present

## 2016-04-10 DIAGNOSIS — G4700 Insomnia, unspecified: Secondary | ICD-10-CM | POA: Diagnosis not present

## 2016-04-10 DIAGNOSIS — F331 Major depressive disorder, recurrent, moderate: Secondary | ICD-10-CM | POA: Diagnosis not present

## 2016-04-10 DIAGNOSIS — R740 Nonspecific elevation of levels of transaminase and lactic acid dehydrogenase [LDH]: Secondary | ICD-10-CM | POA: Diagnosis not present

## 2016-04-10 DIAGNOSIS — F988 Other specified behavioral and emotional disorders with onset usually occurring in childhood and adolescence: Secondary | ICD-10-CM | POA: Diagnosis not present

## 2016-04-15 DIAGNOSIS — R740 Nonspecific elevation of levels of transaminase and lactic acid dehydrogenase [LDH]: Secondary | ICD-10-CM | POA: Diagnosis not present

## 2016-05-08 DIAGNOSIS — R740 Nonspecific elevation of levels of transaminase and lactic acid dehydrogenase [LDH]: Secondary | ICD-10-CM | POA: Diagnosis not present

## 2016-05-08 DIAGNOSIS — F331 Major depressive disorder, recurrent, moderate: Secondary | ICD-10-CM | POA: Diagnosis not present

## 2016-05-08 DIAGNOSIS — G4700 Insomnia, unspecified: Secondary | ICD-10-CM | POA: Diagnosis not present

## 2016-05-08 DIAGNOSIS — F988 Other specified behavioral and emotional disorders with onset usually occurring in childhood and adolescence: Secondary | ICD-10-CM | POA: Diagnosis not present

## 2016-06-25 ENCOUNTER — Encounter: Payer: Self-pay | Admitting: Family Medicine

## 2016-06-25 ENCOUNTER — Ambulatory Visit (INDEPENDENT_AMBULATORY_CARE_PROVIDER_SITE_OTHER): Payer: BLUE CROSS/BLUE SHIELD | Admitting: Family Medicine

## 2016-06-25 VITALS — BP 100/66 | HR 72 | Temp 97.9°F | Resp 16 | Ht 71.0 in | Wt 206.0 lb

## 2016-06-25 DIAGNOSIS — F988 Other specified behavioral and emotional disorders with onset usually occurring in childhood and adolescence: Secondary | ICD-10-CM | POA: Diagnosis not present

## 2016-06-25 DIAGNOSIS — F324 Major depressive disorder, single episode, in partial remission: Secondary | ICD-10-CM

## 2016-06-25 DIAGNOSIS — Z7689 Persons encountering health services in other specified circumstances: Secondary | ICD-10-CM

## 2016-06-25 DIAGNOSIS — Z79899 Other long term (current) drug therapy: Secondary | ICD-10-CM

## 2016-06-25 MED ORDER — VALACYCLOVIR HCL 500 MG PO TABS: 500.0000 mg | ORAL_TABLET | Freq: Two times a day (BID) | ORAL | 3 refills | Status: DC

## 2016-06-25 MED ORDER — AMPHETAMINE-DEXTROAMPHET ER 20 MG PO CP24: ORAL_CAPSULE | ORAL | 0 refills | Status: DC

## 2016-06-25 NOTE — Progress Notes (Signed)
Subjective:    Patient ID: Terry [name], male    DOB: 31-Dec-1985, 31 y.o.   MRN: [id number]  HPI  Patient is a a pleasant 31 year old Caucasian male here today to establish care. He recently took a job in [place] and he will be moving to [place] for the next week. Therefore he will likely not follow up here in the future. However I see both of his parents and he would like a primary care provider in the area in case the job in [place] does not work out. 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 In fact he was hospitalized in the past with suicidal ideation while in [place]. However over the last 2-3 years he's been doing remarkably better. He has been having telephone conferences with the psychiatrist in [place] who is helped him substantially. He is currently on high-dose trintellix 20 mg a day and Abilify 10 mg a day. This combination seems to work better for him than any other combination he is tried in the past. He has no bipolar tendencies. He is only taking the Abilify for severe depression. Past medical history is complicated however by attention deficit disorder. He takes Adderall XR 20 mg twice a day. He has been on this dose for many years. Even though he is graduated, he finds it unable to focus to complete regular task. He finds it easy to become distracted. He reports poor organization and poor listening skills without the medication. He is very concerned about starting his new job in [place] without the medication. At the present time he states that his depression is well controlled. He denies any suicidal ideation. He states he still has his good days and his bad days however he hasn't a severe exacerbation in over 3 years. He denies any trouble getting out of bed in the morning. He denies any problems with appetite or energy level. He does report insomnia for which he takes Ambien. He's been taking Ambien for the last 3 or 4  months. We had a long discussion today about habituation and dependency. I recommended trying to use this medication sparingly. Past Medical History:  Diagnosis Date  . ADHD (attention deficit hyperactivity disorder)   . Allergic rhinitis   . Depression    since 8th grade, hospitalized 11 ([place])  . HSV infection    Past Surgical History:  Procedure Laterality Date  . APPENDECTOMY     2013  . LAPAROSCOPIC APPENDECTOMY N/A 02/08/2013   Procedure: APPENDECTOMY LAPAROSCOPIC;  Surgeon: Velora Heckler, MD;  Location: [facility];  Service: General;  Laterality: N/A;  . WISDOM TOOTH EXTRACTION     No current outpatient prescriptions on file prior to visit.   No current facility-administered medications on file prior to visit.    No Known Allergies Social History   Social History  . Marital status: Single    Spouse name: N/A  . Number of children: N/A  . Years of education: N/A   Occupational History  . college student    Social History Main Topics  . Smoking status: Former Smoker    Packs/day: 0.50    Years: 3.00    Types: Cigarettes    Quit date: 02/26/2008  . Smokeless tobacco: Never Used  . Alcohol use 7.2 oz/week    12 Cans of beer per week     Comment: week  . Drug use: No  . Sexual activity: Not on file  Other Topics Concern  . Not on file   Social History Narrative  . No narrative on file   Family History  Problem Relation Age of Onset  . Allergic rhinitis Mother   . Cancer Maternal Grandfather     bone and prostate     Review of Systems  All other systems reviewed and are negative.      Objective:   Physical Exam  Constitutional: He is oriented to person, place, and time. He appears well-developed and well-nourished. No distress.  HENT:  Head: Normocephalic and atraumatic.  Right Ear: External ear normal.  Left Ear: External ear normal.  Nose: Nose normal.  Mouth/Throat: Oropharynx is clear and moist. No oropharyngeal exudate.  Eyes: Conjunctivae  and EOM are normal. Pupils are equal, round, and reactive to light. Right eye exhibits no discharge. Left eye exhibits no discharge. No scleral icterus.  Neck: Normal range of motion. Neck supple. No JVD present. No tracheal deviation present. No thyromegaly present.  Cardiovascular: Normal rate, regular rhythm, normal heart sounds and intact distal pulses.  Exam reveals no gallop and no friction rub.   No murmur heard. Pulmonary/Chest: Effort normal and breath sounds normal. No stridor. No respiratory distress. He has no wheezes. He has no rales. He exhibits no tenderness.  Abdominal: Soft. Bowel sounds are normal. He exhibits no distension and no mass. There is no tenderness. There is no rebound and no guarding.  Lymphadenopathy:    He has no cervical adenopathy.  Neurological: He is alert and oriented to person, place, and time. He has normal reflexes. He displays normal reflexes. No cranial nerve deficit. He exhibits normal muscle tone. Coordination normal.  Skin: He is not diaphoretic.  Psychiatric: He has a normal mood and affect. His behavior is normal. Thought content normal.  Vitals reviewed.         Assessment & Plan:  Encounter to establish care with new doctor  ADD (attention deficit disorder) without hyperactivity  Depression, major, single episode, in partial remission (HCC)  At the present time I'll make no changes in his dose of medication. Continue Adderall XR 20 mg by mouth every morning and every afternoon. Patient was given 60 tablets and refills for 2 months but postdated. I will continue Trintellix as well as his Abilify until he is able to establish a primary care provider in [place]. I would like him to come in fasting for a CBC, fasting lipid panel, and a CMP given the fact he is taking Abilify. I recommended he try to use Ambien sparingly to avoid habituation and dependency. Recheck in 6 months if not establish with primary care provider or sooner if symptoms  arise

## 2016-06-26 ENCOUNTER — Telehealth: Payer: Self-pay | Admitting: Family Medicine

## 2016-06-26 NOTE — Telephone Encounter (Signed)
Pt calling to get refills on medications - Abilify, Trintillex and Ambien - Ok to refill??

## 2016-06-27 ENCOUNTER — Other Ambulatory Visit: Payer: BLUE CROSS/BLUE SHIELD

## 2016-06-27 DIAGNOSIS — Z79899 Other long term (current) drug therapy: Secondary | ICD-10-CM | POA: Diagnosis not present

## 2016-06-27 LAB — CBC WITH DIFFERENTIAL/PLATELET
Basophils Absolute: 0 cells/uL (ref 0–200)
Basophils Relative: 0 %
Eosinophils Absolute: 128 cells/uL (ref 15–500)
Eosinophils Relative: 2 %
HEMATOCRIT: 47.1 % (ref 38.5–50.0)
Hemoglobin: 15.9 g/dL (ref 13.0–17.0)
Lymphocytes Relative: 45 %
Lymphs Abs: 2880 cells/uL (ref 850–3900)
MCH: 31 pg (ref 27.0–33.0)
MCHC: 33.8 g/dL (ref 32.0–36.0)
MCV: 91.8 fL (ref 80.0–100.0)
MONO ABS: 448 {cells}/uL (ref 200–950)
MPV: 9.3 fL (ref 7.5–12.5)
Monocytes Relative: 7 %
Neutro Abs: 2944 cells/uL (ref 1500–7800)
Neutrophils Relative %: 46 %
Platelets: 211 10*3/uL (ref 140–400)
RBC: 5.13 MIL/uL (ref 4.20–5.80)
RDW: 13.6 % (ref 11.0–15.0)
WBC: 6.4 10*3/uL (ref 3.8–10.8)

## 2016-06-27 LAB — LIPID PANEL
CHOLESTEROL: 131 mg/dL (ref <200)
HDL: 47 mg/dL (ref >40)
LDL Cholesterol: 72 mg/dL (ref <100)
Total CHOL/HDL Ratio: 2.8 Ratio (ref <5.0)
Triglycerides: 61 mg/dL (ref <150)
VLDL: 12 mg/dL (ref <30)

## 2016-06-27 LAB — COMPLETE METABOLIC PANEL WITH GFR
ALK PHOS: 35 U/L — AB (ref 40–115)
ALT: 45 U/L (ref 9–46)
AST: 19 U/L (ref 10–40)
Albumin: 4.5 g/dL (ref 3.6–5.1)
BUN: 29 mg/dL — ABNORMAL HIGH (ref 7–25)
CHLORIDE: 110 mmol/L (ref 98–110)
CO2: 22 mmol/L (ref 20–31)
CREATININE: 1.29 mg/dL (ref 0.60–1.35)
Calcium: 9.3 mg/dL (ref 8.6–10.3)
GFR, Est African American: 85 mL/min (ref >=60)
GFR, Est Non African American: 74 mL/min (ref >=60)
Glucose, Bld: 97 mg/dL (ref 70–99)
Potassium: 4.4 mmol/L (ref 3.5–5.3)
Sodium: 143 mmol/L (ref 135–146)
Total Bilirubin: 0.4 mg/dL (ref 0.2–1.2)
Total Protein: 6.7 g/dL (ref 6.1–8.1)

## 2016-06-27 MED ORDER — ZOLPIDEM TARTRATE 10 MG PO TABS: ORAL_TABLET | ORAL | 2 refills | Status: DC

## 2016-06-27 MED ORDER — ARIPIPRAZOLE 10 MG PO TABS: 10.0000 mg | ORAL_TABLET | Freq: Every day | ORAL | 0 refills | Status: DC

## 2016-06-27 MED ORDER — TRINTELLIX 20 MG PO TABS: 20.0000 mg | ORAL_TABLET | Freq: Every day | ORAL | 0 refills | Status: DC

## 2016-06-27 NOTE — Telephone Encounter (Signed)
ok 

## 2016-06-27 NOTE — Telephone Encounter (Signed)
Medication called/sent to requested pharmacy and pt aware 

## 2016-09-25 ENCOUNTER — Telehealth: Payer: Self-pay | Admitting: Family Medicine

## 2016-09-25 NOTE — Telephone Encounter (Signed)
Pt needs refill on adderall xr, he is currently not in [place] and wants to know if we can call in a script where ever he is. He has an appointment but needs the meds now, he had a script for May but tried to fill it and they told him they couldn't because it was expired.

## 2016-09-26 MED ORDER — AMPHETAMINE-DEXTROAMPHET ER 20 MG PO CP24: ORAL_CAPSULE | ORAL | 0 refills | Status: DC

## 2016-09-26 NOTE — Telephone Encounter (Signed)
Pt called again saying that if we can print the script he can have someone come in and pick it up for him. Please call him back.

## 2016-09-26 NOTE — Telephone Encounter (Signed)
RX printed, left up front and patient aware to pick up  

## 2016-09-26 NOTE — Telephone Encounter (Signed)
ok 

## 2016-09-26 NOTE — Telephone Encounter (Signed)
Ok to refill??  Adderall LOV 06/25/16

## 2016-10-11 ENCOUNTER — Encounter: Payer: Self-pay | Admitting: Family Medicine

## 2016-10-11 ENCOUNTER — Ambulatory Visit (INDEPENDENT_AMBULATORY_CARE_PROVIDER_SITE_OTHER): Payer: BLUE CROSS/BLUE SHIELD | Admitting: Family Medicine

## 2016-10-11 VITALS — BP 104/62 | HR 68 | Temp 99.0°F | Resp 14 | Ht 71.0 in | Wt 196.0 lb

## 2016-10-11 DIAGNOSIS — F988 Other specified behavioral and emotional disorders with onset usually occurring in childhood and adolescence: Secondary | ICD-10-CM

## 2016-10-11 DIAGNOSIS — F324 Major depressive disorder, single episode, in partial remission: Secondary | ICD-10-CM

## 2016-10-11 MED ORDER — AMPHETAMINE-DEXTROAMPHET ER 25 MG PO CP24: 25.0000 mg | ORAL_CAPSULE | Freq: Two times a day (BID) | ORAL | 0 refills | Status: DC

## 2016-10-11 MED ORDER — AMPHETAMINE-DEXTROAMPHET ER 25 MG PO CP24
25.0000 mg | ORAL_CAPSULE | Freq: Two times a day (BID) | ORAL | 0 refills | Status: DC
Start: 2016-10-11 — End: 2016-10-11

## 2016-10-11 NOTE — Progress Notes (Signed)
Subjective:    Patient ID: Terry [name], male    DOB: 11-22-1985, 31 y.o.   MRN: [id number]  HPI 06/25/16 Patient is a a pleasant 31 year old Caucasian male here today to establish care. He recently took a job in [place] and he will be moving to [place] for the next week. Therefore he will likely not follow up here in the future. However I see both of his parents and he would like a primary care provider in the area in case the job in [place] does not work out. 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 In fact he was hospitalized in the past with suicidal ideation while in [place]. However over the last 2-3 years he's been doing remarkably better. He has been having telephone conferences with the psychiatrist in [place] who is helped him substantially. He is currently on high-dose trintellix 20 mg a day and Abilify 10 mg a day. This combination seems to work better for him than any other combination he is tried in the past. He has no bipolar tendencies. He is only taking the Abilify for severe depression. Past medical history is complicated however by attention deficit disorder. He takes Adderall XR 20 mg twice a day. He has been on this dose for many years. Even though he is graduated, he finds it unable to focus to complete regular task. He finds it easy to become distracted. He reports poor organization and poor listening skills without the medication. He is very concerned about starting his new job in [place] without the medication. At the present time he states that his depression is well controlled. He denies any suicidal ideation. He states he still has his good days and his bad days however he hasn't a severe exacerbation in over 3 years. He denies any trouble getting out of bed in the morning. He denies any problems with appetite or energy level. He does report insomnia for which he takes Ambien. He's been taking Ambien for the last 3 or  4 months. We had a long discussion today about habituation and dependency. I recommended trying to use this medication sparingly.  At that time, my plan was: At the present time I'll make no changes in his dose of medication. Continue Adderall XR 20 mg by mouth every morning and every afternoon. Patient was given 60 tablets and refills for 2 months but postdated. I will continue Trintellix as well as his Abilify until he is able to establish a primary care provider in [place]. I would like him to come in fasting for a CBC, fasting lipid panel, and a CMP given the fact he is taking Abilify. I recommended he try to use Ambien sparingly to avoid habituation and dependency. Recheck in 6 months if not establish with primary care provider or sooner if symptoms arise  10/11/16 Patient states that the Adderall seems to wear off early in the afternoon. He is currently taking Adderall XR 40 mg in the morning. He is taking both 20 mg tablets first thing in morning. The medication works well until about 4:00 and then he feels a "crash" he is unable to focus the rest of the day. He denies any insomnia. He denies any anxiety. He denies any palpitations or tachycardia. From the standpoint of his depression, he is doing overall very well. He is taking Abilify in the morning which might be contributing some to the crash later in the day. He denies  any suicidal ideation, hallucinations, or delusion. He is a little sad recently as he and his girlfriend have ended their relationship but overall he seems to be doing well Past Medical History:  Diagnosis Date  . ADHD (attention deficit hyperactivity disorder)   . Allergic rhinitis   . Depression    since 8th grade, hospitalized 19 ([place])  . HSV infection    Past Surgical History:  Procedure Laterality Date  . APPENDECTOMY     2013  . LAPAROSCOPIC APPENDECTOMY N/A 02/08/2013   Procedure: APPENDECTOMY LAPAROSCOPIC;  Surgeon: Velora Heckler, MD;  Location: [facility];   Service: General;  Laterality: N/A;  . WISDOM TOOTH EXTRACTION     Current Outpatient Prescriptions on File Prior to Visit  Medication Sig Dispense Refill  . amphetamine-dextroamphetamine (ADDERALL XR) 20 MG 24 hr capsule TAKE 2 CAPSULES PO EVERY MORNING 60 capsule 0  . ARIPiprazole (ABILIFY) 10 MG tablet Take 1 tablet (10 mg total) by mouth daily. 90 tablet 0  . TRINTELLIX 20 MG TABS Take 20 mg by mouth daily. 90 tablet 0  . valACYclovir (VALTREX) 500 MG tablet Take 1 tablet (500 mg total) by mouth 2 (two) times daily. 6 tablet 3  . zolpidem (AMBIEN) 10 MG tablet TK 1 T PO QD HS 30 tablet 2   No current facility-administered medications on file prior to visit.    No Known Allergies Social History   Social History  . Marital status: Single    Spouse name: N/A  . Number of children: N/A  . Years of education: N/A   Occupational History  . college student    Social History Main Topics  . Smoking status: Former Smoker    Packs/day: 0.50    Years: 3.00    Types: Cigarettes    Quit date: 02/26/2008  . Smokeless tobacco: Never Used  . Alcohol use 7.2 oz/week    12 Cans of beer per week     Comment: week  . Drug use: No  . Sexual activity: Not on file   Other Topics Concern  . Not on file   Social History Narrative  . No narrative on file   Family History  Problem Relation Age of Onset  . Allergic rhinitis Mother   . Cancer Maternal Grandfather        bone and prostate     Review of Systems  All other systems reviewed and are negative.      Objective:   Physical Exam  Constitutional: He is oriented to person, place, and time. He appears well-developed and well-nourished. No distress.  HENT:  Head: Normocephalic and atraumatic.  Right Ear: External ear normal.  Left Ear: External ear normal.  Nose: Nose normal.  Mouth/Throat: Oropharynx is clear and moist. No oropharyngeal exudate.  Eyes: Pupils are equal, round, and reactive to light. Conjunctivae and EOM are  normal. Right eye exhibits no discharge. Left eye exhibits no discharge. No scleral icterus.  Neck: Normal range of motion. Neck supple. No JVD present. No tracheal deviation present. No thyromegaly present.  Cardiovascular: Normal rate, regular rhythm, normal heart sounds and intact distal pulses.  Exam reveals no gallop and no friction rub.   No murmur heard. Pulmonary/Chest: Effort normal and breath sounds normal. No stridor. No respiratory distress. He has no wheezes. He has no rales. He exhibits no tenderness.  Abdominal: Soft. Bowel sounds are normal. He exhibits no distension and no mass. There is no tenderness. There is no rebound and no guarding.  Lymphadenopathy:    He has no cervical adenopathy.  Neurological: He is alert and oriented to person, place, and time. He has normal reflexes. No cranial nerve deficit. He exhibits normal muscle tone. Coordination normal.  Skin: He is not diaphoretic.  Psychiatric: He has a normal mood and affect. His behavior is normal. Thought content normal.  Vitals reviewed.         Assessment & Plan:  ADD (attention deficit disorder) without hyperactivity  Depression, major, single episode, in partial remission (HCC)  I have recommended the patient begin Adderall XR 25 mg tablets but I want him to take them twice a day and try to spread it out. I like him to take first with breakfast and the second one around 2:00 in the afternoon. I believe that will give him more sustained benefit throughout the day without drastically increasing his dose. I also recommended that he take the Abilify at night when trying to go to sleep so that hopefully will not contribute to any fatigue or crash later in the day. Recheck in 6 months or as needed.

## 2016-10-17 ENCOUNTER — Telehealth: Payer: Self-pay | Admitting: Family Medicine

## 2016-10-17 NOTE — Telephone Encounter (Signed)
PA submitted through [email or web address] and received the following - Your information has been submitted to [employer] [place]. [employer] [place] will review the request and fax you a determination directly, typically within 3 business days of your submission once all necessary information is received.If [employer] [place] has not responded in 3 business days or if you have any questions about your submission, contact [employer] [place] at [phone]. 

## 2016-10-17 NOTE — Telephone Encounter (Signed)
Received additional paperwork from [facility] - filled out and faxed back

## 2016-10-21 ENCOUNTER — Telehealth: Payer: Self-pay | Admitting: Family Medicine

## 2016-10-21 NOTE — Telephone Encounter (Signed)
Received PA determination.   PA approved 10/17/2016 through 10/17/2019.  Pharmacy made aware.

## 2016-10-21 NOTE — Telephone Encounter (Signed)
Received PA determination.   PA approved 10/17/2016 through 10/17/2019.  Pt made aware.

## 2016-10-21 NOTE — Telephone Encounter (Signed)
Pt called stating that the pharmacy would not refill his adderall xr due to it needing a PA. Please call him once this is done.

## 2016-10-29 ENCOUNTER — Other Ambulatory Visit: Payer: Self-pay | Admitting: Family Medicine

## 2016-12-13 DIAGNOSIS — F329 Major depressive disorder, single episode, unspecified: Secondary | ICD-10-CM | POA: Diagnosis not present

## 2016-12-13 DIAGNOSIS — Z79899 Other long term (current) drug therapy: Secondary | ICD-10-CM | POA: Diagnosis not present

## 2016-12-13 DIAGNOSIS — R45851 Suicidal ideations: Secondary | ICD-10-CM | POA: Diagnosis not present

## 2016-12-17 DIAGNOSIS — F4010 Social phobia, unspecified: Secondary | ICD-10-CM | POA: Diagnosis not present

## 2016-12-17 DIAGNOSIS — F331 Major depressive disorder, recurrent, moderate: Secondary | ICD-10-CM | POA: Diagnosis not present

## 2016-12-17 DIAGNOSIS — F1220 Cannabis dependence, uncomplicated: Secondary | ICD-10-CM | POA: Diagnosis not present

## 2016-12-17 DIAGNOSIS — F1094 Alcohol use, unspecified with alcohol-induced mood disorder: Secondary | ICD-10-CM | POA: Diagnosis not present

## 2016-12-17 DIAGNOSIS — Z0389 Encounter for observation for other suspected diseases and conditions ruled out: Secondary | ICD-10-CM | POA: Diagnosis not present

## 2016-12-17 DIAGNOSIS — G4700 Insomnia, unspecified: Secondary | ICD-10-CM | POA: Diagnosis not present

## 2016-12-17 DIAGNOSIS — Z79899 Other long term (current) drug therapy: Secondary | ICD-10-CM | POA: Diagnosis not present

## 2016-12-17 DIAGNOSIS — F12980 Cannabis use, unspecified with anxiety disorder: Secondary | ICD-10-CM | POA: Diagnosis not present

## 2016-12-17 DIAGNOSIS — F1020 Alcohol dependence, uncomplicated: Secondary | ICD-10-CM | POA: Diagnosis not present

## 2016-12-18 DIAGNOSIS — F1094 Alcohol use, unspecified with alcohol-induced mood disorder: Secondary | ICD-10-CM | POA: Diagnosis not present

## 2016-12-18 DIAGNOSIS — F12980 Cannabis use, unspecified with anxiety disorder: Secondary | ICD-10-CM | POA: Diagnosis not present

## 2016-12-18 DIAGNOSIS — G4700 Insomnia, unspecified: Secondary | ICD-10-CM | POA: Diagnosis not present

## 2016-12-18 DIAGNOSIS — F1220 Cannabis dependence, uncomplicated: Secondary | ICD-10-CM | POA: Diagnosis not present

## 2016-12-18 DIAGNOSIS — Z79899 Other long term (current) drug therapy: Secondary | ICD-10-CM | POA: Diagnosis not present

## 2016-12-18 DIAGNOSIS — F331 Major depressive disorder, recurrent, moderate: Secondary | ICD-10-CM | POA: Diagnosis not present

## 2016-12-18 DIAGNOSIS — F1020 Alcohol dependence, uncomplicated: Secondary | ICD-10-CM | POA: Diagnosis not present

## 2016-12-18 DIAGNOSIS — F4010 Social phobia, unspecified: Secondary | ICD-10-CM | POA: Diagnosis not present

## 2016-12-19 DIAGNOSIS — Z79899 Other long term (current) drug therapy: Secondary | ICD-10-CM | POA: Diagnosis not present

## 2016-12-19 DIAGNOSIS — F1094 Alcohol use, unspecified with alcohol-induced mood disorder: Secondary | ICD-10-CM | POA: Diagnosis not present

## 2016-12-19 DIAGNOSIS — F331 Major depressive disorder, recurrent, moderate: Secondary | ICD-10-CM | POA: Diagnosis not present

## 2016-12-19 DIAGNOSIS — F4010 Social phobia, unspecified: Secondary | ICD-10-CM | POA: Diagnosis not present

## 2016-12-19 DIAGNOSIS — F12980 Cannabis use, unspecified with anxiety disorder: Secondary | ICD-10-CM | POA: Diagnosis not present

## 2016-12-19 DIAGNOSIS — G4700 Insomnia, unspecified: Secondary | ICD-10-CM | POA: Diagnosis not present

## 2016-12-23 DIAGNOSIS — F1094 Alcohol use, unspecified with alcohol-induced mood disorder: Secondary | ICD-10-CM | POA: Diagnosis not present

## 2016-12-23 DIAGNOSIS — F331 Major depressive disorder, recurrent, moderate: Secondary | ICD-10-CM | POA: Diagnosis not present

## 2016-12-23 DIAGNOSIS — F12980 Cannabis use, unspecified with anxiety disorder: Secondary | ICD-10-CM | POA: Diagnosis not present

## 2016-12-23 DIAGNOSIS — F4010 Social phobia, unspecified: Secondary | ICD-10-CM | POA: Diagnosis not present

## 2016-12-23 DIAGNOSIS — G4700 Insomnia, unspecified: Secondary | ICD-10-CM | POA: Diagnosis not present

## 2016-12-23 DIAGNOSIS — Z79899 Other long term (current) drug therapy: Secondary | ICD-10-CM | POA: Diagnosis not present

## 2016-12-25 DIAGNOSIS — F331 Major depressive disorder, recurrent, moderate: Secondary | ICD-10-CM | POA: Diagnosis not present

## 2016-12-25 DIAGNOSIS — F4010 Social phobia, unspecified: Secondary | ICD-10-CM | POA: Diagnosis not present

## 2016-12-25 DIAGNOSIS — G4700 Insomnia, unspecified: Secondary | ICD-10-CM | POA: Diagnosis not present

## 2016-12-25 DIAGNOSIS — Z79899 Other long term (current) drug therapy: Secondary | ICD-10-CM | POA: Diagnosis not present

## 2016-12-25 DIAGNOSIS — F12980 Cannabis use, unspecified with anxiety disorder: Secondary | ICD-10-CM | POA: Diagnosis not present

## 2016-12-25 DIAGNOSIS — F1220 Cannabis dependence, uncomplicated: Secondary | ICD-10-CM | POA: Diagnosis not present

## 2016-12-25 DIAGNOSIS — F1094 Alcohol use, unspecified with alcohol-induced mood disorder: Secondary | ICD-10-CM | POA: Diagnosis not present

## 2017-01-07 DIAGNOSIS — F331 Major depressive disorder, recurrent, moderate: Secondary | ICD-10-CM | POA: Diagnosis not present

## 2017-01-08 DIAGNOSIS — F331 Major depressive disorder, recurrent, moderate: Secondary | ICD-10-CM | POA: Diagnosis not present

## 2017-02-13 DIAGNOSIS — N451 Epididymitis: Secondary | ICD-10-CM | POA: Diagnosis not present

## 2017-02-20 ENCOUNTER — Ambulatory Visit: Payer: BLUE CROSS/BLUE SHIELD | Admitting: Family Medicine

## 2017-02-20 ENCOUNTER — Encounter: Payer: Self-pay | Admitting: Family Medicine

## 2017-02-20 VITALS — BP 104/70 | HR 74 | Temp 98.3°F | Resp 12 | Ht 71.0 in | Wt 194.0 lb

## 2017-02-20 DIAGNOSIS — Z7251 High risk heterosexual behavior: Secondary | ICD-10-CM | POA: Diagnosis not present

## 2017-02-20 DIAGNOSIS — F324 Major depressive disorder, single episode, in partial remission: Secondary | ICD-10-CM | POA: Diagnosis not present

## 2017-02-20 DIAGNOSIS — F988 Other specified behavioral and emotional disorders with onset usually occurring in childhood and adolescence: Secondary | ICD-10-CM | POA: Diagnosis not present

## 2017-02-20 MED ORDER — ARIPIPRAZOLE 10 MG PO TABS: 10.0000 mg | ORAL_TABLET | Freq: Every day | ORAL | 0 refills | Status: DC

## 2017-02-20 MED ORDER — MIRTAZAPINE 30 MG PO TABS: 30.0000 mg | ORAL_TABLET | Freq: Every day | ORAL | 5 refills | Status: DC

## 2017-02-20 MED ORDER — AMPHETAMINE-DEXTROAMPHET ER 25 MG PO CP24: 25.0000 mg | ORAL_CAPSULE | Freq: Two times a day (BID) | ORAL | 0 refills | Status: DC

## 2017-02-20 MED ORDER — VORTIOXETINE HBR 20 MG PO TABS: 20.0000 mg | ORAL_TABLET | Freq: Every day | ORAL | 11 refills | Status: DC

## 2017-02-20 NOTE — Progress Notes (Signed)
Subjective:    Patient ID: Terry [name], male    DOB: Jan 11, 1986, 31 y.o.   MRN: [id number]  Medication Refill    06/25/16 Patient is a a pleasant 31 year old Caucasian male here today to establish care. He recently took a job in [place] and he will be moving to [place] for the next week. Therefore he will likely not follow up here in the future. However I see both of his parents and he would like a primary care provider in the area in case the job in [place] does not work out. 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 In fact he was hospitalized in the past with suicidal ideation while in [place]. However over the last 2-3 years he's been doing remarkably better. He has been having telephone conferences with the psychiatrist in [place] who is helped him substantially. He is currently on high-dose trintellix 20 mg a day and Abilify 10 mg a day. This combination seems to work better for him than any other combination he is tried in the past. He has no bipolar tendencies. He is only taking the Abilify for severe depression. Past medical history is complicated however by attention deficit disorder. He takes Adderall XR 20 mg twice a day. He has been on this dose for many years. Even though he is graduated, he finds it unable to focus to complete regular task. He finds it easy to become distracted. He reports poor organization and poor listening skills without the medication. He is very concerned about starting his new job in [place] without the medication. At the present time he states that his depression is well controlled. He denies any suicidal ideation. He states he still has his good days and his bad days however he hasn't a severe exacerbation in over 3 years. He denies any trouble getting out of bed in the morning. He denies any problems with appetite or energy level. He does report insomnia for which he takes Ambien. He's been taking  Ambien for the last 3 or 4 months. We had a long discussion today about habituation and dependency. I recommended trying to use this medication sparingly.  At that time, my plan was: At the present time I'll make no changes in his dose of medication. Continue Adderall XR 20 mg by mouth every morning and every afternoon. Patient was given 60 tablets and refills for 2 months but postdated. I will continue Trintellix as well as his Abilify until he is able to establish a primary care provider in [place]. I would like him to come in fasting for a CBC, fasting lipid panel, and a CMP given the fact he is taking Abilify. I recommended he try to use Ambien sparingly to avoid habituation and dependency. Recheck in 6 months if not establish with primary care provider or sooner if symptoms arise  10/11/16 Patient states that the Adderall seems to wear off early in the afternoon. He is currently taking Adderall XR 40 mg in the morning. He is taking both 20 mg tablets first thing in morning. The medication works well until about 4:00 and then he feels a "crash" he is unable to focus the rest of the day. He denies any insomnia. He denies any anxiety. He denies any palpitations or tachycardia. From the standpoint of his depression, he is doing overall very well. He is taking Abilify in the morning which might be contributing some to the crash later in  the day. He denies any suicidal ideation, hallucinations, or delusion. He is a little sad recently as he and his girlfriend have ended their relationship but overall he seems to be doing well.  At that time, my plan was: I have recommended the patient begin Adderall XR 25 mg tablets but I want him to take them twice a day and try to spread it out. I like him to take first with breakfast and the second one around 2:00 in the afternoon. I believe that will give him more sustained benefit throughout the day without drastically increasing his dose. I also recommended that he  take the Abilify at night when trying to go to sleep so that hopefully will not contribute to any fatigue or crash later in the day. Recheck in 6 months or as needed.   02/20/17 Patient was admitted to the hospital in [place] in August due to depression. At that time they discontinued Ambien and replace it with Remeron 15 mg a day. Since that time he has done fairly well. However he states that he is never completely felt that the depression went into remission. Recently his girlfriend ended their relationship. As a result his depression has drastically worsened. He also ran out of his Abilify and Remeron more than a week ago. That coupled with the breakup of his girlfriend coupled with the holidays his left him and severe depression. He denies any suicidal ideation or suicidal plan. He denies any hallucinations or delusions. He is not a threat to himself or to anyone else. However he is extremely sad. He tentatively plan to go back to [place] today. However I do not believe this is a good idea. He would be alone in [place] and I would much rather him be here with his family who can left him and can supervise/monitor him. He is in agreement with this plan. He also requests STD testing. He was recently diagnosed with epididymitis after a sexual relationship of the person that he did not know very well. He has not been checked for gonorrhea or chlamydia or HIV and he is requesting that today      Past Medical History:  Diagnosis Date  . ADHD (attention deficit hyperactivity disorder)   . Allergic rhinitis   . Depression    since 8th grade, hospitalized 73 ([place])  . HSV infection    Past Surgical History:  Procedure Laterality Date  . APPENDECTOMY     2013  . LAPAROSCOPIC APPENDECTOMY N/A 02/08/2013   Procedure: APPENDECTOMY LAPAROSCOPIC;  Surgeon: Velora Heckler, MD;  Location: [facility];  Service: General;  Laterality: N/A;  . WISDOM TOOTH EXTRACTION     Current  Outpatient Medications on File Prior to Visit  Medication Sig Dispense Refill  . amphetamine-dextroamphetamine (ADDERALL XR) 25 MG 24 hr capsule Take 1 capsule by mouth 2 (two) times daily. 60 capsule 0  . ARIPiprazole (ABILIFY) 10 MG tablet Take 1 tablet (10 mg total) by mouth daily. 90 tablet 0  . mirtazapine (REMERON) 15 MG tablet   0  . TRINTELLIX 20 MG TABS TAKE 1 TABLET BY MOUTH EVERY DAY 30 tablet 11  . valACYclovir (VALTREX) 500 MG tablet Take 1 tablet (500 mg total) by mouth 2 (two) times daily. 6 tablet 3   No current facility-administered medications on file prior to visit.    No Known Allergies Social History   Socioeconomic History  . Marital status: Single    Spouse name: Not on file  .  Number of children: Not on file  . Years of education: Not on file  . Highest education level: Not on file  Social Needs  . Financial resource strain: Not on file  . Food insecurity - worry: Not on file  . Food insecurity - inability: Not on file  . Transportation needs - medical: Not on file  . Transportation needs - non-medical: Not on file  Occupational History  . Occupation: Archivistcollege student  Tobacco Use  . Smoking status: Former Smoker    Packs/day: 0.50    Years: 3.00    Pack years: 1.50    Types: Cigarettes    Last attempt to quit: 02/26/2008    Years since quitting: 8.9  . Smokeless tobacco: Never Used  Substance and Sexual Activity  . Alcohol use: Yes    Alcohol/week: 7.2 oz    Types: 12 Cans of beer per week    Comment: week  . Drug use: No  . Sexual activity: Not on file  Other Topics Concern  . Not on file  Social History Narrative  . Not on file   Family History  Problem Relation Age of Onset  . Allergic rhinitis Mother   . Cancer Maternal Grandfather        bone and prostate     Review of Systems  All other systems reviewed and are negative.      Objective:   Physical Exam  Constitutional: He is oriented to person, place, and time. He appears  well-developed and well-nourished. No distress.  HENT:  Head: Normocephalic and atraumatic.  Right Ear: External ear normal.  Left Ear: External ear normal.  Nose: Nose normal.  Mouth/Throat: Oropharynx is clear and moist. No oropharyngeal exudate.  Eyes: Conjunctivae and EOM are normal. Pupils are equal, round, and reactive to light. Right eye exhibits no discharge. Left eye exhibits no discharge. No scleral icterus.  Neck: Normal range of motion. Neck supple. No JVD present. No tracheal deviation present. No thyromegaly present.  Cardiovascular: Normal rate, regular rhythm, normal heart sounds and intact distal pulses. Exam reveals no gallop and no friction rub.  No murmur heard. Pulmonary/Chest: Effort normal and breath sounds normal. No stridor. No respiratory distress. He has no wheezes. He has no rales. He exhibits no tenderness.  Abdominal: Soft. Bowel sounds are normal. He exhibits no distension and no mass. There is no tenderness. There is no rebound and no guarding.  Lymphadenopathy:    He has no cervical adenopathy.  Neurological: He is alert and oriented to person, place, and time. He has normal reflexes. No cranial nerve deficit. He exhibits normal muscle tone. Coordination normal.  Skin: He is not diaphoretic.  Psychiatric: He has a normal mood and affect. His behavior is normal. Thought content normal.  Vitals reviewed.         Assessment & Plan:  High-risk sexual behavior, recent history of epididymitis, major depressive disorder, ADD  Increase Remeron to 30 mg a day and recheck here in one week. Do not go back to [place]. The patient will stay here with his family or he can be monitored more closely. Resume history trintellix and his Abilify which he ran out of.  Screen the patient for HIV, gonorrhea, Chlamydia per his request. Reassess the patient in one week

## 2017-02-21 LAB — C. TRACHOMATIS/N. GONORRHOEAE RNA
C. TRACHOMATIS RNA, TMA: NOT DETECTED
N. gonorrhoeae RNA, TMA: NOT DETECTED

## 2017-02-21 LAB — HIV ANTIBODY (ROUTINE TESTING W REFLEX): HIV 1&2 Ab, 4th Generation: NONREACTIVE

## 2017-02-27 ENCOUNTER — Encounter: Payer: Self-pay | Admitting: Family Medicine

## 2017-02-27 ENCOUNTER — Ambulatory Visit (INDEPENDENT_AMBULATORY_CARE_PROVIDER_SITE_OTHER): Payer: BLUE CROSS/BLUE SHIELD | Admitting: Family Medicine

## 2017-02-27 VITALS — BP 100/60 | HR 64 | Temp 98.1°F | Resp 14 | Ht 71.0 in | Wt 195.0 lb

## 2017-02-27 DIAGNOSIS — N451 Epididymitis: Secondary | ICD-10-CM

## 2017-02-27 DIAGNOSIS — F324 Major depressive disorder, single episode, in partial remission: Secondary | ICD-10-CM

## 2017-02-27 MED ORDER — DOXYCYCLINE HYCLATE 100 MG PO TABS: 100.0000 mg | ORAL_TABLET | Freq: Two times a day (BID) | ORAL | 0 refills | Status: DC

## 2017-02-27 MED ORDER — SILDENAFIL CITRATE 100 MG PO TABS: 50.0000 mg | ORAL_TABLET | Freq: Every day | ORAL | 11 refills | Status: DC | PRN

## 2017-02-27 NOTE — Progress Notes (Signed)
Subjective:    Patient ID: Terry [name], male    DOB: Jan 11, 1986, 32 y.o.   MRN: [id number]  Medication Refill    06/25/16 Patient is a a pleasant 32 year old Caucasian male here today to establish care. He recently took a job in [place] and he will be moving to [place] for the next week. Therefore he will likely not follow up here in the future. However I see both of his parents and he would like a primary care provider in the area in case the job in [place] does not work out. 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 In fact he was hospitalized in the past with suicidal ideation while in [place]. However over the last 2-3 years he's been doing remarkably better. He has been having telephone conferences with the psychiatrist in [place] who is helped him substantially. He is currently on high-dose trintellix 20 mg a day and Abilify 10 mg a day. This combination seems to work better for him than any other combination he is tried in the past. He has no bipolar tendencies. He is only taking the Abilify for severe depression. Past medical history is complicated however by attention deficit disorder. He takes Adderall XR 20 mg twice a day. He has been on this dose for many years. Even though he is graduated, he finds it unable to focus to complete regular task. He finds it easy to become distracted. He reports poor organization and poor listening skills without the medication. He is very concerned about starting his new job in [place] without the medication. At the present time he states that his depression is well controlled. He denies any suicidal ideation. He states he still has his good days and his bad days however he hasn't a severe exacerbation in over 3 years. He denies any trouble getting out of bed in the morning. He denies any problems with appetite or energy level. He does report insomnia for which he takes Ambien. He's been taking  Ambien for the last 3 or 4 months. We had a long discussion today about habituation and dependency. I recommended trying to use this medication sparingly.  At that time, my plan was: At the present time I'll make no changes in his dose of medication. Continue Adderall XR 20 mg by mouth every morning and every afternoon. Patient was given 60 tablets and refills for 2 months but postdated. I will continue Trintellix as well as his Abilify until he is able to establish a primary care provider in [place]. I would like him to come in fasting for a CBC, fasting lipid panel, and a CMP given the fact he is taking Abilify. I recommended he try to use Ambien sparingly to avoid habituation and dependency. Recheck in 6 months if not establish with primary care provider or sooner if symptoms arise  10/11/16 Patient states that the Adderall seems to wear off early in the afternoon. He is currently taking Adderall XR 40 mg in the morning. He is taking both 20 mg tablets first thing in morning. The medication works well until about 4:00 and then he feels a "crash" he is unable to focus the rest of the day. He denies any insomnia. He denies any anxiety. He denies any palpitations or tachycardia. From the standpoint of his depression, he is doing overall very well. He is taking Abilify in the morning which might be contributing some to the crash later in  the day. He denies any suicidal ideation, hallucinations, or delusion. He is a little sad recently as he and his girlfriend have ended their relationship but overall he seems to be doing well.  At that time, my plan was: I have recommended the patient begin Adderall XR 25 mg tablets but I want him to take them twice a day and try to spread it out. I like him to take first with breakfast and the second one around 2:00 in the afternoon. I believe that will give him more sustained benefit throughout the day without drastically increasing his dose. I also recommended that he  take the Abilify at night when trying to go to sleep so that hopefully will not contribute to any fatigue or crash later in the day. Recheck in 6 months or as needed.   02/20/17 Patient was admitted to the hospital in [place] in August due to depression. At that time they discontinued Ambien and replace it with Remeron 15 mg a day. Since that time he has done fairly well. However he states that he is never completely felt that the depression went into remission. Recently his girlfriend ended their relationship. As a result his depression has drastically worsened. He also ran out of his Abilify and Remeron more than a week ago. That coupled with the breakup of his girlfriend coupled with the holidays his left him and severe depression. He denies any suicidal ideation or suicidal plan. He denies any hallucinations or delusions. He is not a threat to himself or to anyone else. However he is extremely sad. He tentatively plan to go back to [place] today. However I do not believe this is a good idea. He would be alone in [place] and I would much rather him be here with his family who can left him and can supervise/monitor him. He is in agreement with this plan. He also requests STD testing. He was recently diagnosed with epididymitis after a sexual relationship of the person that he did not know very well. He has not been checked for gonorrhea or chlamydia or HIV and he is requesting that today.  AT that time, my plan was: Increase Remeron to 30 mg a day and recheck here in one week. Do not go back to [place] . The patient will stay here with his family or he can be monitored more closely. Resume history trintellix and his Abilify which he ran out of.  Screen the patient for HIV, gonorrhea, Chlamydia per his request. Reassess the patient in one week  02/27/17 Patient is doing much better today. The depression is much better now that he is back on his medication. He also feels better now  that he is made to the holidays. His parents have been watching him closely over the last week. He feels safe and supported. He certainly feels like he is coming out of the tunnel. From that standpoint I'm very happy today that he is doing better. He continues to complain of some pain in his left testicle. On exam, the left testicle is not swollen. There is no mass or nodule. He does have some pain and tenderness over the epididymis concerning for epididymitis. There is no lymphadenopathy. He also reports erectile dysfunction due to his medication.      Past Medical History:  Diagnosis Date  . ADHD (attention deficit hyperactivity disorder)   . Allergic rhinitis   . Depression    since 8th grade, hospitalized [address]2015 ([place])  . HSV infection  Past Surgical History:  Procedure Laterality Date  . APPENDECTOMY     2013  . LAPAROSCOPIC APPENDECTOMY N/A 02/08/2013   Procedure: APPENDECTOMY LAPAROSCOPIC;  Surgeon: Velora Heckler, MD;  Location: [facility];  Service: General;  Laterality: N/A;  . WISDOM TOOTH EXTRACTION     Current Outpatient Medications on File Prior to Visit  Medication Sig Dispense Refill  . amphetamine-dextroamphetamine (ADDERALL XR) 25 MG 24 hr capsule Take 1 capsule by mouth 2 (two) times daily. 60 capsule 0  . ARIPiprazole (ABILIFY) 10 MG tablet Take 1 tablet (10 mg total) by mouth daily. 90 tablet 0  . mirtazapine (REMERON) 30 MG tablet Take 1 tablet (30 mg total) by mouth at bedtime. 30 tablet 5  . valACYclovir (VALTREX) 500 MG tablet Take 1 tablet (500 mg total) by mouth 2 (two) times daily. 6 tablet 3  . vortioxetine HBr (TRINTELLIX) 20 MG TABS Take 20 mg by mouth daily. 30 tablet 11   No current facility-administered medications on file prior to visit.    No Known Allergies Social History   Socioeconomic History  . Marital status: Single    Spouse name: Not on file  . Number of children: Not on file  . Years of education: Not on file  . Highest education level:  Not on file  Social Needs  . Financial resource strain: Not on file  . Food insecurity - worry: Not on file  . Food insecurity - inability: Not on file  . Transportation needs - medical: Not on file  . Transportation needs - non-medical: Not on file  Occupational History  . Occupation: Archivist  Tobacco Use  . Smoking status: Former Smoker    Packs/day: 0.50    Years: 3.00    Pack years: 1.50    Types: Cigarettes    Last attempt to quit: 02/26/2008    Years since quitting: 9.0  . Smokeless tobacco: Never Used  Substance and Sexual Activity  . Alcohol use: Yes    Alcohol/week: 7.2 oz    Types: 12 Cans of beer per week    Comment: week  . Drug use: No  . Sexual activity: Not on file  Other Topics Concern  . Not on file  Social History Narrative  . Not on file   Family History  Problem Relation Age of Onset  . Allergic rhinitis Mother   . Cancer Maternal Grandfather        bone and prostate     Review of Systems  All other systems reviewed and are negative.      Objective:   Physical Exam  Constitutional: He is oriented to person, place, and time. He appears well-developed and well-nourished. No distress.  HENT:  Head: Normocephalic and atraumatic.  Right Ear: External ear normal.  Left Ear: External ear normal.  Nose: Nose normal.  Mouth/Throat: Oropharynx is clear and moist. No oropharyngeal exudate.  Eyes: Conjunctivae and EOM are normal. Pupils are equal, round, and reactive to light. Right eye exhibits no discharge. Left eye exhibits no discharge. No scleral icterus.  Neck: Normal range of motion. Neck supple. No JVD present. No tracheal deviation present. No thyromegaly present.  Cardiovascular: Normal rate, regular rhythm, normal heart sounds and intact distal pulses. Exam reveals no gallop and no friction rub.  No murmur heard. Pulmonary/Chest: Effort normal and breath sounds normal. No stridor. No respiratory distress. He has no wheezes. He has no  rales. He exhibits no tenderness.  Abdominal: Soft. Bowel sounds are  normal. He exhibits no distension and no mass. There is no tenderness. There is no rebound and no guarding. Hernia confirmed negative in the right inguinal area and confirmed negative in the left inguinal area.  Genitourinary: Penis normal. Right testis shows no mass and no tenderness. Left testis shows tenderness. Left testis shows no mass.  Lymphadenopathy:    He has no cervical adenopathy.       Right: No inguinal adenopathy present.       Left: No inguinal adenopathy present.  Neurological: He is alert and oriented to person, place, and time. He has normal reflexes. No cranial nerve deficit. He exhibits normal muscle tone. Coordination normal.  Skin: He is not diaphoretic.  Psychiatric: He has a normal mood and affect. His behavior is normal. Thought content normal.  Vitals reviewed.         Assessment & Plan:   Depression, major, single episode, in partial remission (HCC)  Epididymitis  I will treat epididymitis with doxycycline 100 mg by mouth twice a day for 10 days. Obtain ultrasound if pain persists. Continue his current medications for his depression as he is doing better. Patient denies any suicidal ideation and feels safe to return back home to resume work. I encouraged him to find a psychiatrist in [place] for follow-up. I will give the patient Viagra, 50-100 mg by mouth daily when necessary sexual activity. I believe his erectile dysfunction secondary to medication, particularly his Abilify

## 2017-03-05 ENCOUNTER — Telehealth: Payer: Self-pay | Admitting: Family Medicine

## 2017-03-05 MED ORDER — VORTIOXETINE HBR 20 MG PO TABS: 20.0000 mg | ORAL_TABLET | Freq: Every day | ORAL | 11 refills | Status: DC

## 2017-03-05 NOTE — Telephone Encounter (Signed)
PA submitted through [email or web address] for drug induced ED and received the following : Your demographic data has been sent to the plan successfully. They will respond with your clinical questions and you will be notified by email when available within the next business day. You can also check for an update later by opening this request from your dashboard. Please do not fax or call the plan to resubmit this request. If you need assistance, please chat with [employer] or call us at [phone].

## 2017-03-05 NOTE — Telephone Encounter (Signed)
PA approved - pharm aware

## 2017-03-05 NOTE — Telephone Encounter (Signed)
PA submitted through [email or web address] and received the following : Your information has been submitted to [employer]. To check for an updated outcome later, reopen this PA request from your dashboard. If [employer] has not responded to your request within 24 hours, contact [employer] at [phone]. If you think there may be a problem with your PA request, use our live chat feature at the bottom right.

## 2017-03-05 NOTE — Telephone Encounter (Signed)
Your information has been submitted to [employer]. To check for an updated outcome later, reopen this PA request from your dashboard. If [employer] has not responded to your request within 24 hours, contact [employer] at [phone]. If you think there may be a problem with your PA request, use our live chat feature at the bottom right.    

## 2017-03-07 MED ORDER — SILDENAFIL CITRATE 100 MG PO TABS: 50.0000 mg | ORAL_TABLET | Freq: Every day | ORAL | 11 refills | Status: DC | PRN

## 2017-03-07 NOTE — Telephone Encounter (Signed)
Your PA request has been denied. Additional information will be provided in the denial communication. (Message 1140)   Pharm aware.

## 2017-04-14 ENCOUNTER — Other Ambulatory Visit: Payer: Self-pay | Admitting: Family Medicine

## 2017-04-14 MED ORDER — AMPHETAMINE-DEXTROAMPHET ER 25 MG PO CP24: 25.0000 mg | ORAL_CAPSULE | Freq: Two times a day (BID) | ORAL | 0 refills | Status: DC

## 2017-04-14 NOTE — Telephone Encounter (Signed)
Pt requesting refill on Adderall      LOV: 02/27/17  LRF:   02/20/17

## 2017-04-15 ENCOUNTER — Telehealth: Payer: Self-pay | Admitting: Family Medicine

## 2017-04-15 NOTE — Telephone Encounter (Signed)
PA Submitted through [email or web address] and received the following:  This request has received a Favorable outcome. Please note any additional information provided by [employer] [place] at the bottom of this request.  Pharmacy aware.

## 2017-04-16 NOTE — Telephone Encounter (Signed)
Approved from 03/16/2017 - 04/15/2018

## 2017-05-13 ENCOUNTER — Other Ambulatory Visit: Payer: Self-pay | Admitting: Family Medicine

## 2017-05-13 MED ORDER — VORTIOXETINE HBR 20 MG PO TABS: 20.0000 mg | ORAL_TABLET | Freq: Every day | ORAL | 11 refills | Status: DC

## 2017-05-29 ENCOUNTER — Other Ambulatory Visit: Payer: Self-pay | Admitting: *Deleted

## 2017-05-29 MED ORDER — AMPHETAMINE-DEXTROAMPHET ER 25 MG PO CP24: 25.0000 mg | ORAL_CAPSULE | Freq: Two times a day (BID) | ORAL | 0 refills | Status: DC

## 2017-05-29 NOTE — Telephone Encounter (Signed)
Received call from patient.   Requested refill on Adderall to [facility] in [place].  Ok to refill??  Last office visit 02/27/2017.  Last refill 04/14/2017.

## 2017-07-01 ENCOUNTER — Other Ambulatory Visit: Payer: Self-pay | Admitting: Family Medicine

## 2017-07-01 MED ORDER — AMPHETAMINE-DEXTROAMPHET ER 25 MG PO CP24: 25.0000 mg | ORAL_CAPSULE | Freq: Two times a day (BID) | ORAL | 0 refills | Status: DC

## 2017-07-01 NOTE — Telephone Encounter (Signed)
Requesting refill    Adderall  LOV: 02/27/17  LRF:  05/29/17

## 2017-07-31 ENCOUNTER — Ambulatory Visit: Payer: BLUE CROSS/BLUE SHIELD | Admitting: Family Medicine

## 2017-07-31 ENCOUNTER — Encounter: Payer: Self-pay | Admitting: Family Medicine

## 2017-07-31 VITALS — BP 100/64 | HR 106 | Temp 98.5°F | Resp 14 | Ht 71.0 in | Wt 192.0 lb

## 2017-07-31 DIAGNOSIS — F988 Other specified behavioral and emotional disorders with onset usually occurring in childhood and adolescence: Secondary | ICD-10-CM

## 2017-07-31 DIAGNOSIS — F324 Major depressive disorder, single episode, in partial remission: Secondary | ICD-10-CM

## 2017-07-31 MED ORDER — SILDENAFIL CITRATE 20 MG PO TABS
60.0000 mg | ORAL_TABLET | Freq: Every day | ORAL | 3 refills | Status: DC | PRN
Start: 2017-07-31 — End: 2018-06-12

## 2017-07-31 MED ORDER — LURASIDONE HCL 20 MG PO TABS: 20.0000 mg | ORAL_TABLET | Freq: Every day | ORAL | 2 refills | Status: DC

## 2017-07-31 NOTE — Progress Notes (Signed)
Subjective:    Patient ID: Terry [name], male    DOB: 1986-01-21, 32 y.o.   MRN: [id number]  Medication Refill    06/25/16 Patient is a a pleasant 32 year old Caucasian male here today to establish care. He recently took a job in [place] and he will be moving to [place] for the next week. Therefore he will likely not follow up here in the future. However I see both of his parents and he would like a primary care provider in the area in case the job in [place] does not work out. 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 In fact he was hospitalized in the past with suicidal ideation while in [place]. However over the last 2-3 years he's been doing remarkably better. He has been having telephone conferences with the psychiatrist in [place] who is helped him substantially. He is currently on high-dose trintellix 20 mg a day and Abilify 10 mg a day. This combination seems to work better for him than any other combination he is tried in the past. He has no bipolar tendencies. He is only taking the Abilify for severe depression. Past medical history is complicated however by attention deficit disorder. He takes Adderall XR 20 mg twice a day. He has been on this dose for many years. Even though he is graduated, he finds it unable to focus to complete regular task. He finds it easy to become distracted. He reports poor organization and poor listening skills without the medication. He is very concerned about starting his new job in [place] without the medication. At the present time he states that his depression is well controlled. He denies any suicidal ideation. He states he still has his good days and his bad days however he hasn't a severe exacerbation in over 3 years. He denies any trouble getting out of bed in the morning. He denies any problems with appetite or energy level. He does report insomnia for which he takes Ambien. He's been taking  Ambien for the last 3 or 4 months. We had a long discussion today about habituation and dependency. I recommended trying to use this medication sparingly.  At that time, my plan was: At the present time I'll make no changes in his dose of medication. Continue Adderall XR 20 mg by mouth every morning and every afternoon. Patient was given 60 tablets and refills for 2 months but postdated. I will continue Trintellix as well as his Abilify until he is able to establish a primary care provider in [place]. I would like him to come in fasting for a CBC, fasting lipid panel, and a CMP given the fact he is taking Abilify. I recommended he try to use Ambien sparingly to avoid habituation and dependency. Recheck in 6 months if not establish with primary care provider or sooner if symptoms arise  10/11/16 Patient states that the Adderall seems to wear off early in the afternoon. He is currently taking Adderall XR 40 mg in the morning. He is taking both 20 mg tablets first thing in morning. The medication works well until about 4:00 and then he feels a "crash" he is unable to focus the rest of the day. He denies any insomnia. He denies any anxiety. He denies any palpitations or tachycardia. From the standpoint of his depression, he is doing overall very well. He is taking Abilify in the morning which might be contributing some to the crash later in  the day. He denies any suicidal ideation, hallucinations, or delusion. He is a little sad recently as he and his girlfriend have ended their relationship but overall he seems to be doing well.  At that time, my plan was: I have recommended the patient begin Adderall XR 25 mg tablets but I want him to take them twice a day and try to spread it out. I like him to take first with breakfast and the second one around 2:00 in the afternoon. I believe that will give him more sustained benefit throughout the day without drastically increasing his dose. I also recommended that he  take the Abilify at night when trying to go to sleep so that hopefully will not contribute to any fatigue or crash later in the day. Recheck in 6 months or as needed.   02/20/17 Patient was admitted to the hospital in [place] in August due to depression. At that time they discontinued Ambien and replace it with Remeron 15 mg a day. Since that time he has done fairly well. However he states that he is never completely felt that the depression went into remission. Recently his girlfriend ended their relationship. As a result his depression has drastically worsened. He also ran out of his Abilify and Remeron more than a week ago. That coupled with the breakup of his girlfriend coupled with the holidays his left him and severe depression. He denies any suicidal ideation or suicidal plan. He denies any hallucinations or delusions. He is not a threat to himself or to anyone else. However he is extremely sad. He tentatively plan to go back to [place] today. However I do not believe this is a good idea. He would be alone in [place] and I would much rather him be here with his family who can left him and can supervise/monitor him. He is in agreement with this plan. He also requests STD testing. He was recently diagnosed with epididymitis after a sexual relationship of the person that he did not know very well. He has not been checked for gonorrhea or chlamydia or HIV and he is requesting that today.  AT that time, my plan was: Increase Remeron to 30 mg a day and recheck here in one week. Do not go back to [place]. The patient will stay here with his family or he can be monitored more closely. Resume history trintellix and his Abilify which he ran out of.  Screen the patient for HIV, gonorrhea, Chlamydia per his request. Reassess the patient in one week  02/27/17 Patient is doing much better today. The depression is much better now that he is back on his medication. He also feels better now  that he is made to the holidays. His parents have been watching him closely over the last week. He feels safe and supported. He certainly feels like he is coming out of the tunnel. From that standpoint I'm very happy today that he is doing better. He continues to complain of some pain in his left testicle. On exam, the left testicle is not swollen. There is no mass or nodule. He does have some pain and tenderness over the epididymis concerning for epididymitis. There is no lymphadenopathy. He also reports erectile dysfunction due to his medication.  At that time, my plan was: I will treat epididymitis with doxycycline 100 mg by mouth twice a day for 10 days. Obtain ultrasound if pain persists. Continue his current medications for his depression as he is doing better.  Patient denies any suicidal ideation and feels safe to return back home to resume work. I encouraged him to find a psychiatrist in [place] for follow-up. I will give the patient Viagra, 50-100 mg by mouth daily when necessary sexual activity. I believe his erectile dysfunction secondary to medication, particularly his Abilify  07/31/17 Patient has since moved back home from [place] and is living with his parents again in [place].  He never established with a psychiatrist in [place] as we had discussed.  Instead he was continuing to see a psychiatrist in the [facility] via telephone.  That psychiatrist started him on Latuda in addition to Abilify.  Patient states that he has had his ups and downs but overall his depression is much better than it was earlier this year.  I question the combination of Latuda and Abilify.  He is not certain whether his psychiatrist intended to keep him on this combination in addition to his Trintellix.  He continues to report erectile dysfunction and decreased libido.  He denies any gynecomastia.  He continues to do well on his current dose of Adderall.  He denies any suicidal thoughts.  He  denies any mania.  He denies any insomnia.  He denies any increase in goal-directed behavior.  He denies any racing thoughts, tangential thoughts.  He has normal speech.  He has normal behavior.  He denies any hallucinations or delusions.     Past Medical History:  Diagnosis Date  . ADHD (attention deficit hyperactivity disorder)   . Allergic rhinitis   . Depression    since 8th grade, hospitalized 38 ([place])  . HSV infection    Past Surgical History:  Procedure Laterality Date  . APPENDECTOMY     2013  . LAPAROSCOPIC APPENDECTOMY N/A 02/08/2013   Procedure: APPENDECTOMY LAPAROSCOPIC;  Surgeon: Velora Heckler, MD;  Location: [facility];  Service: General;  Laterality: N/A;  . WISDOM TOOTH EXTRACTION     Current Outpatient Medications on File Prior to Visit  Medication Sig Dispense Refill  . amphetamine-dextroamphetamine (ADDERALL XR) 25 MG 24 hr capsule Take 1 capsule by mouth 2 (two) times daily. 60 capsule 0  . ARIPiprazole (ABILIFY) 10 MG tablet Take 1 tablet (10 mg total) by mouth daily. 90 tablet 0  . lurasidone (LATUDA) 20 MG TABS tablet Take 20 mg by mouth at bedtime.    . mirtazapine (REMERON) 30 MG tablet Take 1 tablet (30 mg total) by mouth at bedtime. 30 tablet 5  . valACYclovir (VALTREX) 500 MG tablet Take 1 tablet (500 mg total) by mouth 2 (two) times daily. 6 tablet 3  . vortioxetine HBr (TRINTELLIX) 20 MG TABS tablet Take 1 tablet (20 mg total) by mouth daily. 30 tablet 11   No current facility-administered medications on file prior to visit.    No Known Allergies Social History   Socioeconomic History  . Marital status: Single    Spouse name: Not on file  . Number of children: Not on file  . Years of education: Not on file  . Highest education level: Not on file  Occupational History  . Occupation: Archivist  Social Needs  . Financial resource strain: Not on file  . Food insecurity:    Worry: Not on file    Inability: Not on file  . Transportation  needs:    Medical: Not on file    Non-medical: Not on file  Tobacco Use  . Smoking status: Former Smoker    Packs/day: 0.50  Years: 3.00    Pack years: 1.50    Types: Cigarettes    Last attempt to quit: 02/26/2008    Years since quitting: 9.4  . Smokeless tobacco: Never Used  Substance and Sexual Activity  . Alcohol use: Yes    Alcohol/week: 7.2 oz    Types: 12 Cans of beer per week    Comment: week  . Drug use: No  . Sexual activity: Not on file  Lifestyle  . Physical activity:    Days per week: Not on file    Minutes per session: Not on file  . Stress: Not on file  Relationships  . Social connections:    Talks on phone: Not on file    Gets together: Not on file    Attends religious service: Not on file    Active member of club or organization: Not on file    Attends meetings of clubs or organizations: Not on file    Relationship status: Not on file  . Intimate partner violence:    Fear of current or ex partner: Not on file    Emotionally abused: Not on file    Physically abused: Not on file    Forced sexual activity: Not on file  Other Topics Concern  . Not on file  Social History Narrative  . Not on file   Family History  Problem Relation Age of Onset  . Allergic rhinitis Mother   . Cancer Maternal Grandfather        bone and prostate     Review of Systems  All other systems reviewed and are negative.      Objective:   Physical Exam  Constitutional: He is oriented to person, place, and time. He appears well-developed and well-nourished. No distress.  HENT:  Head: Normocephalic and atraumatic.  Right Ear: External ear normal.  Left Ear: External ear normal.  Nose: Nose normal.  Mouth/Throat: Oropharynx is clear and moist. No oropharyngeal exudate.  Eyes: Pupils are equal, round, and reactive to light. Conjunctivae and EOM are normal. Right eye exhibits no discharge. Left eye exhibits no discharge. No scleral icterus.  Neck: Normal range of motion.  Neck supple. No JVD present. No tracheal deviation present. No thyromegaly present.  Cardiovascular: Normal rate, regular rhythm, normal heart sounds and intact distal pulses. Exam reveals no gallop and no friction rub.  No murmur heard. Pulmonary/Chest: Effort normal and breath sounds normal. No stridor. No respiratory distress. He has no wheezes. He has no rales. He exhibits no tenderness.  Abdominal: Soft. Bowel sounds are normal. He exhibits no distension and no mass. There is no tenderness. There is no rebound and no guarding.  Lymphadenopathy:    He has no cervical adenopathy.  Neurological: He is alert and oriented to person, place, and time. He has normal reflexes. No cranial nerve deficit. He exhibits normal muscle tone. Coordination normal.  Skin: He is not diaphoretic.  Psychiatric: He has a normal mood and affect. His behavior is normal. Thought content normal.  Vitals reviewed.         Assessment & Plan:   Depression, major, single episode, in partial remission (HCC)  ADD (attention deficit disorder) without hyperactivity  I recommended discontinuation of Abilify given the fact the patient seems to be doing better on Latuda.  I explained to him that I do not use these medications in combination given the fact they are in the same pharmacologic class due to the risk of side effects and polypharmacy.  Therefore I will continue him on his current dose of Latuda in addition to Trintellix.  I will also make no changes in his dose of Adderall for ADD.  However I have recommended that he establish with a local psychiatrist.  As I explained to the patient, I believe his situation is more complex than what I feel comfortable managing and that he needs a psychiatric expert to help manage his medication.  I do not feel that his current method of intermittent follow-up via telephone is sufficient.  Therefore I provided him with the contact information for local psychiatrist, Dr. Betti Cruz and  strongly encouraged him to call and make that appointment.

## 2017-08-18 ENCOUNTER — Other Ambulatory Visit: Payer: Self-pay | Admitting: Family Medicine

## 2017-08-18 MED ORDER — AMPHETAMINE-DEXTROAMPHET ER 25 MG PO CP24: 25.0000 mg | ORAL_CAPSULE | Freq: Two times a day (BID) | ORAL | 0 refills | Status: DC

## 2017-08-18 NOTE — Telephone Encounter (Signed)
Refill on adderall to [facility] [address]

## 2017-08-18 NOTE — Telephone Encounter (Signed)
Pt requesting refill on Adderall      LOV: 07/31/17  LRF:   07/01/17

## 2017-08-19 ENCOUNTER — Other Ambulatory Visit: Payer: Self-pay | Admitting: Family Medicine

## 2017-08-19 MED ORDER — AMPHETAMINE-DEXTROAMPHET ER 25 MG PO CP24: 25.0000 mg | ORAL_CAPSULE | Freq: Two times a day (BID) | ORAL | 0 refills | Status: DC

## 2017-08-19 NOTE — Telephone Encounter (Signed)
Medication was verified - hx and dosing. Medication sent to pharmacy requested.  Verified with prior pharmacy that controlled substance was not filled and Rx was cancelled.

## 2017-08-19 NOTE — Telephone Encounter (Signed)
Can you please resend his adderall

## 2017-08-19 NOTE — Telephone Encounter (Signed)
Pt called in cannot have adderall filled at the [facility] anymore because his insurance will not cover it, please resend prescription to [facility] [address]

## 2017-08-29 ENCOUNTER — Telehealth: Payer: Self-pay | Admitting: Family Medicine

## 2017-08-29 NOTE — Telephone Encounter (Signed)
Patient left vm requesting a refill on his mirtazapine and trintellix he uses the [facility] [address].    CB# [phone]

## 2017-09-01 MED ORDER — MIRTAZAPINE 30 MG PO TABS: 30.0000 mg | ORAL_TABLET | Freq: Every day | ORAL | 5 refills | Status: DC

## 2017-09-01 MED ORDER — VORTIOXETINE HBR 20 MG PO TABS: 20.0000 mg | ORAL_TABLET | Freq: Every day | ORAL | 11 refills | Status: DC

## 2017-09-01 NOTE — Telephone Encounter (Signed)
Medication called/sent to requested pharmacy  

## 2017-10-28 ENCOUNTER — Other Ambulatory Visit: Payer: Self-pay | Admitting: Family Medicine

## 2017-10-28 MED ORDER — AMPHETAMINE-DEXTROAMPHET ER 25 MG PO CP24: 25.0000 mg | ORAL_CAPSULE | Freq: Two times a day (BID) | ORAL | 0 refills | Status: DC

## 2017-10-28 NOTE — Telephone Encounter (Signed)
Patient needs adderall refill  Please send to [facility] [address]

## 2017-10-28 NOTE — Telephone Encounter (Signed)
Ok to refill??  Last office visit 07/31/2017.  Last refill 08/19/2017.

## 2017-11-03 ENCOUNTER — Encounter: Payer: Self-pay | Admitting: Family Medicine

## 2017-11-03 ENCOUNTER — Ambulatory Visit: Payer: BLUE CROSS/BLUE SHIELD | Admitting: Family Medicine

## 2017-11-03 VITALS — BP 110/68 | HR 68 | Temp 98.0°F | Resp 14 | Ht 71.0 in | Wt 198.0 lb

## 2017-11-03 DIAGNOSIS — Z13220 Encounter for screening for lipoid disorders: Secondary | ICD-10-CM | POA: Diagnosis not present

## 2017-11-03 DIAGNOSIS — M7021 Olecranon bursitis, right elbow: Secondary | ICD-10-CM | POA: Diagnosis not present

## 2017-11-03 DIAGNOSIS — N522 Drug-induced erectile dysfunction: Secondary | ICD-10-CM | POA: Diagnosis not present

## 2017-11-03 MED ORDER — SILDENAFIL CITRATE 100 MG PO TABS: 50.0000 mg | ORAL_TABLET | Freq: Every day | ORAL | 3 refills | Status: DC | PRN

## 2017-11-03 NOTE — Progress Notes (Signed)
Subjective:    Patient ID: Terry [name], male    DOB: 1986-01-21, 32 y.o.   MRN: [id number]  Medication Refill    06/25/16 Patient is a a pleasant 32 year old Caucasian male here today to establish care. He recently took a job in [place] and he will be moving to [place] for the next week. Therefore he will likely not follow up here in the future. However I see both of his parents and he would like a primary care provider in the area in case the job in [place] does not work out. 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 In fact he was hospitalized in the past with suicidal ideation while in [place]. However over the last 2-3 years he's been doing remarkably better. He has been having telephone conferences with the psychiatrist in [place] who is helped him substantially. He is currently on high-dose trintellix 20 mg a day and Abilify 10 mg a day. This combination seems to work better for him than any other combination he is tried in the past. He has no bipolar tendencies. He is only taking the Abilify for severe depression. Past medical history is complicated however by attention deficit disorder. He takes Adderall XR 20 mg twice a day. He has been on this dose for many years. Even though he is graduated, he finds it unable to focus to complete regular task. He finds it easy to become distracted. He reports poor organization and poor listening skills without the medication. He is very concerned about starting his new job in [place] without the medication. At the present time he states that his depression is well controlled. He denies any suicidal ideation. He states he still has his good days and his bad days however he hasn't a severe exacerbation in over 3 years. He denies any trouble getting out of bed in the morning. He denies any problems with appetite or energy level. He does report insomnia for which he takes Ambien. He's been taking  Ambien for the last 3 or 4 months. We had a long discussion today about habituation and dependency. I recommended trying to use this medication sparingly.  At that time, my plan was: At the present time I'll make no changes in his dose of medication. Continue Adderall XR 20 mg by mouth every morning and every afternoon. Patient was given 60 tablets and refills for 2 months but postdated. I will continue Trintellix as well as his Abilify until he is able to establish a primary care provider in [place]. I would like him to come in fasting for a CBC, fasting lipid panel, and a CMP given the fact he is taking Abilify. I recommended he try to use Ambien sparingly to avoid habituation and dependency. Recheck in 6 months if not establish with primary care provider or sooner if symptoms arise  10/11/16 Patient states that the Adderall seems to wear off early in the afternoon. He is currently taking Adderall XR 40 mg in the morning. He is taking both 20 mg tablets first thing in morning. The medication works well until about 4:00 and then he feels a "crash" he is unable to focus the rest of the day. He denies any insomnia. He denies any anxiety. He denies any palpitations or tachycardia. From the standpoint of his depression, he is doing overall very well. He is taking Abilify in the morning which might be contributing some to the crash later in  the day. He denies any suicidal ideation, hallucinations, or delusion. He is a little sad recently as he and his girlfriend have ended their relationship but overall he seems to be doing well.  At that time, my plan was: I have recommended the patient begin Adderall XR 25 mg tablets but I want him to take them twice a day and try to spread it out. I like him to take first with breakfast and the second one around 2:00 in the afternoon. I believe that will give him more sustained benefit throughout the day without drastically increasing his dose. I also recommended that he  take the Abilify at night when trying to go to sleep so that hopefully will not contribute to any fatigue or crash later in the day. Recheck in 6 months or as needed.   02/20/17 Patient was admitted to the hospital in [place] in August due to depression. At that time they discontinued Ambien and replace it with Remeron 15 mg a day. Since that time he has done fairly well. However he states that he is never completely felt that the depression went into remission. Recently his girlfriend ended their relationship. As a result his depression has drastically worsened. He also ran out of his Abilify and Remeron more than a week ago. That coupled with the breakup of his girlfriend coupled with the holidays his left him and severe depression. He denies any suicidal ideation or suicidal plan. He denies any hallucinations or delusions. He is not a threat to himself or to anyone else. However he is extremely sad. He tentatively plan to go back to [place] today. However I do not believe this is a good idea. He would be alone in [place] and I would much rather him be here with his family who can left him and can supervise/monitor him. He is in agreement with this plan. He also requests STD testing. He was recently diagnosed with epididymitis after a sexual relationship of the person that he did not know very well. He has not been checked for gonorrhea or chlamydia or HIV and he is requesting that today.  AT that time, my plan was: Increase Remeron to 30 mg a day and recheck here in one week. Do not go back to [place]. The patient will stay here with his family or he can be monitored more closely. Resume history trintellix and his Abilify which he ran out of.  Screen the patient for HIV, gonorrhea, Chlamydia per his request. Reassess the patient in one week  02/27/17 Patient is doing much better today. The depression is much better now that he is back on his medication. He also feels better now  that he is made to the holidays. His parents have been watching him closely over the last week. He feels safe and supported. He certainly feels like he is coming out of the tunnel. From that standpoint I'm very happy today that he is doing better. He continues to complain of some pain in his left testicle. On exam, the left testicle is not swollen. There is no mass or nodule. He does have some pain and tenderness over the epididymis concerning for epididymitis. There is no lymphadenopathy. He also reports erectile dysfunction due to his medication.  At that time, my plan was: I will treat epididymitis with doxycycline 100 mg by mouth twice a day for 10 days. Obtain ultrasound if pain persists. Continue his current medications for his depression as he is doing better.  Patient denies any suicidal ideation and feels safe to return back home to resume work. I encouraged him to find a psychiatrist in [place] for follow-up. I will give the patient Viagra, 50-100 mg by mouth daily when necessary sexual activity. I believe his erectile dysfunction secondary to medication, particularly his Abilify  07/31/17 Patient has since moved back home from [place] and is living with his parents again in [place].  He never established with a psychiatrist in [place] as we had discussed.  Instead he was continuing to see a psychiatrist in the [facility] via telephone.  That psychiatrist started him on Latuda in addition to Abilify.  Patient states that he has had his ups and downs but overall his depression is much better than it was earlier this year.  I question the combination of Latuda and Abilify.  He is not certain whether his psychiatrist intended to keep him on this combination in addition to his Trintellix.  He continues to report erectile dysfunction and decreased libido.  He denies any gynecomastia.  He continues to do well on his current dose of Adderall.  He denies any suicidal thoughts.  He  denies any mania.  He denies any insomnia.  He denies any increase in goal-directed behavior.  He denies any racing thoughts, tangential thoughts.  He has normal speech.  He has normal behavior.  He denies any hallucinations or delusions.  At that time, my plan was: I recommended discontinuation of Abilify given the fact the patient seems to be doing better on Latuda.  I explained to him that I do not use these medications in combination given the fact they are in the same pharmacologic class due to the risk of side effects and polypharmacy.  Therefore I will continue him on his current dose of Latuda in addition to Trintellix.  I will also make no changes in his dose of Adderall for ADD.  However I have recommended that he establish with a local psychiatrist.  As I explained to the patient, I believe his situation is more complex than what I feel comfortable managing and that he needs a psychiatric expert to help manage his medication.  I do not feel that his current method of intermittent follow-up via telephone is sufficient.  Therefore I provided him with the contact information for local psychiatrist, Dr. Betti Cruz and strongly encouraged him to call and make that appointment.  11/03/17 Patient presents today with a variety of concerns.  #1 he has mild swelling in the olecranon bursa of his right elbow.  There is a trace amount of fluid however the quantity of fluid is so small that I do not feel comfortable attempting aspiration as I do not feel that it would be successful.  There is no erythema.  There is no warmth.  There is very minimal discomfort.  Patient states that it has been improving gradually over the last week on its own.  He denies any injury.  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  He is seeing a therapist but he has yet to arrange follow-up with a local psychiatrist.  He is still seeing a psychiatrist in [place].  I explained to  the patient that I do not feel comfortable given the complexity of his depression managing this on my own.  I truly believe that he would benefit having a local psychiatrist that can manage his medications particular given the fact he is on  ketamine injections.  I have again recommended that he establish care with a local psychiatrist who can manage his depression who may be able to provide more appropriate care given the complexity of his case.  Patient understands and states that he will proceed with contacting a local psychiatrist.  #3 he continues to report erectile dysfunction.  He never filled the prescription for Viagra.  He is certainly on multiple medications that may impact his ability to achieve and maintain an erection such as Latuda as well as Trintellix  Past Medical History:  Diagnosis Date  . ADHD (attention deficit hyperactivity disorder)   . Allergic rhinitis   . Depression    since 8th grade, hospitalized 12 ([place])  . HSV infection    Past Surgical History:  Procedure Laterality Date  . APPENDECTOMY     2013  . LAPAROSCOPIC APPENDECTOMY N/A 02/08/2013   Procedure: APPENDECTOMY LAPAROSCOPIC;  Surgeon: Velora Heckler, MD;  Location: [facility];  Service: General;  Laterality: N/A;  . WISDOM TOOTH EXTRACTION     Current Outpatient Medications on File Prior to Visit  Medication Sig Dispense Refill  . amphetamine-dextroamphetamine (ADDERALL XR) 25 MG 24 hr capsule Take 1 capsule by mouth 2 (two) times daily. 60 capsule 0  . lurasidone (LATUDA) 40 MG TABS tablet Take 40 mg by mouth daily with breakfast.    . mirtazapine (REMERON) 30 MG tablet Take 1 tablet (30 mg total) by mouth at bedtime. 30 tablet 5  . sildenafil (REVATIO) 20 MG tablet Take 3 tablets (60 mg total) by mouth daily as needed. 50 tablet 3  . valACYclovir (VALTREX) 500 MG tablet Take 1 tablet (500 mg total) by mouth 2 (two) times daily. 6 tablet 3  . vortioxetine HBr (TRINTELLIX) 20 MG TABS tablet Take 1 tablet (20  mg total) by mouth daily. 30 tablet 11   No current facility-administered medications on file prior to visit.    No Known Allergies Social History   Socioeconomic History  . Marital status: Single    Spouse name: Not on file  . Number of children: Not on file  . Years of education: Not on file  . Highest education level: Not on file  Occupational History  . Occupation: Archivist  Social Needs  . Financial resource strain: Not on file  . Food insecurity:    Worry: Not on file    Inability: Not on file  . Transportation needs:    Medical: Not on file    Non-medical: Not on file  Tobacco Use  . Smoking status: Former Smoker    Packs/day: 0.50    Years: 3.00    Pack years: 1.50    Types: Cigarettes    Last attempt to quit: 02/26/2008    Years since quitting: 9.6  . Smokeless tobacco: Never Used  Substance and Sexual Activity  . Alcohol use: Yes    Alcohol/week: 12.0 standard drinks    Types: 12 Cans of beer per week    Comment: week  . Drug use: No  . Sexual activity: Not on file  Lifestyle  . Physical activity:    Days per week: Not on file    Minutes per session: Not on file  . Stress: Not on file  Relationships  . Social connections:    Talks on phone: Not on file    Gets together: Not on file    Attends religious service: Not on file    Active member of club or organization: Not  on file    Attends meetings of clubs or organizations: Not on file    Relationship status: Not on file  . Intimate partner violence:    Fear of current or ex partner: Not on file    Emotionally abused: Not on file    Physically abused: Not on file    Forced sexual activity: Not on file  Other Topics Concern  . Not on file  Social History Narrative  . Not on file   Family History  Problem Relation Age of Onset  . Allergic rhinitis Mother   . Cancer Maternal Grandfather        bone and prostate     Review of Systems  All other systems reviewed and are negative.        Objective:   Physical Exam  Constitutional: He appears well-developed and well-nourished. No distress.  Cardiovascular: Normal rate, regular rhythm, normal heart sounds and intact distal pulses. Exam reveals no gallop and no friction rub.  No murmur heard. Pulmonary/Chest: Effort normal and breath sounds normal. No respiratory distress. He has no wheezes. He has no rales. He exhibits no tenderness.  Musculoskeletal:       Right elbow: He exhibits normal range of motion, no swelling and no effusion. Tenderness found. Olecranon process tenderness noted.  Skin: He is not diaphoretic.  Psychiatric: He has a normal mood and affect. His behavior is normal. Thought content normal.  Vitals reviewed.         Assessment & Plan:   Drug-induced erectile dysfunction - Plan: Testosterone  Screening cholesterol level - Plan: CBC with Differential/Platelet, COMPLETE METABOLIC PANEL WITH GFR, Lipid panel  Olecranon bursitis of right elbow  Patient has mild olecranon bursitis.  I have recommended over-the-counter Aleve and an elbow sleeve and tincture of time.  I anticipate gradual improvement over the next week.  I will check his baseline lab work per his request particular given the fact he is on an atypical antipsychotic including a CBC, CMP, and a lipid panel.  Given his erectile dysfunction which I believe is medication induced, I will check a testosterone level as he may have hypogonadism contributing.  Meanwhile he can use Viagra 50 to 100 mg p.o. daily as needed erectile dysfunction.

## 2017-11-04 LAB — CBC WITH DIFFERENTIAL/PLATELET
BASOS ABS: 30 {cells}/uL (ref 0–200)
Basophils Relative: 0.4 %
EOS ABS: 91 {cells}/uL (ref 15–500)
EOS PCT: 1.2 %
HEMATOCRIT: 43.5 % (ref 38.5–50.0)
Hemoglobin: 15 g/dL (ref 13.2–17.1)
LYMPHS ABS: 2288 {cells}/uL (ref 850–3900)
MCH: 30.9 pg (ref 27.0–33.0)
MCHC: 34.5 g/dL (ref 32.0–36.0)
MCV: 89.5 fL (ref 80.0–100.0)
MPV: 9.9 fL (ref 7.5–12.5)
Monocytes Relative: 6.5 %
NEUTROS PCT: 61.8 %
Neutro Abs: 4697 cells/uL (ref 1500–7800)
Platelets: 210 10*3/uL (ref 140–400)
RBC: 4.86 10*6/uL (ref 4.20–5.80)
RDW: 13.2 % (ref 11.0–15.0)
Total Lymphocyte: 30.1 %
WBC mixed population: 494 cells/uL (ref 200–950)
WBC: 7.6 10*3/uL (ref 3.8–10.8)

## 2017-11-04 LAB — COMPLETE METABOLIC PANEL WITH GFR
AG RATIO: 2.1 (calc) (ref 1.0–2.5)
ALKALINE PHOSPHATASE (APISO): 39 U/L — AB (ref 40–115)
ALT: 45 U/L (ref 9–46)
AST: 21 U/L (ref 10–40)
Albumin: 4.7 g/dL (ref 3.6–5.1)
BILIRUBIN TOTAL: 0.6 mg/dL (ref 0.2–1.2)
BUN: 21 mg/dL (ref 7–25)
CHLORIDE: 106 mmol/L (ref 98–110)
CO2: 25 mmol/L (ref 20–32)
Calcium: 9.6 mg/dL (ref 8.6–10.3)
Creat: 1.2 mg/dL (ref 0.60–1.35)
GFR, Est African American: 93 mL/min/{1.73_m2} (ref >=60)
GFR, Est Non African American: 80 mL/min/{1.73_m2} (ref >=60)
Globulin: 2.2 g/dL (calc) (ref 1.9–3.7)
Glucose, Bld: 93 mg/dL (ref 65–99)
Potassium: 4.4 mmol/L (ref 3.5–5.3)
Sodium: 142 mmol/L (ref 135–146)
Total Protein: 6.9 g/dL (ref 6.1–8.1)

## 2017-11-04 LAB — TESTOSTERONE: Testosterone: 377 ng/dL (ref 250–827)

## 2017-11-04 LAB — LIPID PANEL
CHOL/HDL RATIO: 2.4 (calc) (ref <5.0)
Cholesterol: 146 mg/dL (ref <200)
HDL: 60 mg/dL (ref >40)
LDL Cholesterol (Calc): 76 mg/dL (calc)
Non-HDL Cholesterol (Calc): 86 mg/dL (calc) (ref <130)
TRIGLYCERIDES: 35 mg/dL (ref <150)

## 2017-11-26 DIAGNOSIS — F4322 Adjustment disorder with anxiety: Secondary | ICD-10-CM | POA: Diagnosis not present

## 2017-11-28 DIAGNOSIS — F4322 Adjustment disorder with anxiety: Secondary | ICD-10-CM | POA: Diagnosis not present

## 2017-12-03 DIAGNOSIS — F4322 Adjustment disorder with anxiety: Secondary | ICD-10-CM | POA: Diagnosis not present

## 2017-12-05 ENCOUNTER — Other Ambulatory Visit: Payer: Self-pay | Admitting: Family Medicine

## 2017-12-05 DIAGNOSIS — F4322 Adjustment disorder with anxiety: Secondary | ICD-10-CM | POA: Diagnosis not present

## 2017-12-05 MED ORDER — AMPHETAMINE-DEXTROAMPHET ER 25 MG PO CP24: 25.0000 mg | ORAL_CAPSULE | Freq: Two times a day (BID) | ORAL | 0 refills | Status: DC

## 2017-12-05 NOTE — Telephone Encounter (Signed)
Pt requesting refill on Adderall      LOV: 11/03/17  LRF:   10/28/17

## 2017-12-09 DIAGNOSIS — F4322 Adjustment disorder with anxiety: Secondary | ICD-10-CM | POA: Diagnosis not present

## 2017-12-11 DIAGNOSIS — F4322 Adjustment disorder with anxiety: Secondary | ICD-10-CM | POA: Diagnosis not present

## 2017-12-16 ENCOUNTER — Telehealth: Payer: Self-pay | Admitting: Family Medicine

## 2017-12-16 NOTE — Telephone Encounter (Signed)
Needs PA for Adderall XR 25 mg bid as he has changed ins. PA Submitted through [email or web address] and received the following:  Your information has been submitted to [facility]. [employer] [place] will review the request and fax you a determination directly, typically within 3 business days of your submission once all necessary information is received. If [employer] [place] has not responded in 3 business days or if you have any questions about your submission, contact [employer] [place] at [phone].

## 2017-12-17 DIAGNOSIS — F4322 Adjustment disorder with anxiety: Secondary | ICD-10-CM | POA: Diagnosis not present

## 2017-12-19 DIAGNOSIS — F4322 Adjustment disorder with anxiety: Secondary | ICD-10-CM | POA: Diagnosis not present

## 2017-12-19 NOTE — Telephone Encounter (Signed)
Pt & pharm aware via vm

## 2017-12-19 NOTE — Telephone Encounter (Signed)
Approvedon October 24  Effective from 12/16/2017 through 12/14/2020.

## 2017-12-23 DIAGNOSIS — F4322 Adjustment disorder with anxiety: Secondary | ICD-10-CM | POA: Diagnosis not present

## 2017-12-25 DIAGNOSIS — F4322 Adjustment disorder with anxiety: Secondary | ICD-10-CM | POA: Diagnosis not present

## 2017-12-30 DIAGNOSIS — F4322 Adjustment disorder with anxiety: Secondary | ICD-10-CM | POA: Diagnosis not present

## 2018-01-01 DIAGNOSIS — F4322 Adjustment disorder with anxiety: Secondary | ICD-10-CM | POA: Diagnosis not present

## 2018-01-06 DIAGNOSIS — F4322 Adjustment disorder with anxiety: Secondary | ICD-10-CM | POA: Diagnosis not present

## 2018-01-08 DIAGNOSIS — F4322 Adjustment disorder with anxiety: Secondary | ICD-10-CM | POA: Diagnosis not present

## 2018-01-20 ENCOUNTER — Other Ambulatory Visit: Payer: Self-pay | Admitting: Family Medicine

## 2018-01-20 MED ORDER — AMPHETAMINE-DEXTROAMPHET ER 25 MG PO CP24: 25.0000 mg | ORAL_CAPSULE | Freq: Two times a day (BID) | ORAL | 0 refills | Status: DC

## 2018-01-20 NOTE — Telephone Encounter (Signed)
Pt requesting refill on Adderall      LOV: 11/03/17  LRF:   12/05/17

## 2018-01-21 DIAGNOSIS — F4320 Adjustment disorder, unspecified: Secondary | ICD-10-CM | POA: Diagnosis not present

## 2018-01-23 DIAGNOSIS — F4320 Adjustment disorder, unspecified: Secondary | ICD-10-CM | POA: Diagnosis not present

## 2018-02-04 DIAGNOSIS — F4320 Adjustment disorder, unspecified: Secondary | ICD-10-CM | POA: Diagnosis not present

## 2018-02-06 ENCOUNTER — Encounter: Payer: Self-pay | Admitting: Family Medicine

## 2018-02-06 ENCOUNTER — Ambulatory Visit
Admission: RE | Admit: 2018-02-06 | Discharge: 2018-02-06 | Disposition: A | Discharge status: Home / Self Care | Payer: Self-pay | Source: Ambulatory Visit | Attending: Family Medicine | Admitting: Family Medicine

## 2018-02-06 ENCOUNTER — Ambulatory Visit: Payer: BLUE CROSS/BLUE SHIELD | Admitting: Family Medicine

## 2018-02-06 VITALS — BP 110/74 | HR 62 | Temp 98.7°F | Resp 16 | Ht 71.0 in | Wt 196.0 lb

## 2018-02-06 DIAGNOSIS — M545 Low back pain, unspecified: Secondary | ICD-10-CM

## 2018-02-06 DIAGNOSIS — F4320 Adjustment disorder, unspecified: Secondary | ICD-10-CM | POA: Diagnosis not present

## 2018-02-06 DIAGNOSIS — M4186 Other forms of scoliosis, lumbar region: Secondary | ICD-10-CM | POA: Diagnosis not present

## 2018-02-06 MED ORDER — MELOXICAM 15 MG PO TABS: 15.0000 mg | ORAL_TABLET | Freq: Every day | ORAL | 0 refills | Status: DC

## 2018-02-06 MED ORDER — TIZANIDINE HCL 4 MG PO TABS: 4.0000 mg | ORAL_TABLET | Freq: Four times a day (QID) | ORAL | 0 refills | Status: DC | PRN

## 2018-02-06 NOTE — Progress Notes (Signed)
Subjective:    Patient ID: Terry [name], male    DOB: October 21, 1985, 32 y.o.   MRN: [id number] Patient states that he has a history of low back pain.  In the past, he states that he had 4 bulging disks that were treated with epidural steroid injections and gradually improved.  Over the last few months he has developed pain in the middle of his back roughly from the level of T11 down to L5.  He describes it as a constant pressure-like pain.  He feels the need to stretch and twist to try to "pop his back numerous times throughout the day.  When his back pops, he feels better for a short period of time but the pressure and tightness returned shortly thereafter.  Denies any neuropathy in his legs.  He denies any weakness in his legs.  He denies any burning or stinging pain in his legs.  Neurologic exam is completely normal today  Past Medical History:  Diagnosis Date  . ADHD (attention deficit hyperactivity disorder)   . Allergic rhinitis   . Depression    since 8th grade, hospitalized 422015 ([place])  . HSV infection    Past Surgical History:  Procedure Laterality Date  . APPENDECTOMY     2013  . LAPAROSCOPIC APPENDECTOMY N/A 02/08/2013   Procedure: APPENDECTOMY LAPAROSCOPIC;  Surgeon: Velora Hecklerodd M Gerkin, MD;  Location: [facility];  Service: General;  Laterality: N/A;  . WISDOM TOOTH EXTRACTION     Current Outpatient Medications on File Prior to Visit  Medication Sig Dispense Refill  . amphetamine-dextroamphetamine (ADDERALL XR) 25 MG 24 hr capsule Take 1 capsule by mouth 2 (two) times daily. 60 capsule 0  . lurasidone (LATUDA) 40 MG TABS tablet Take 40 mg by mouth daily with breakfast.    . mirtazapine (REMERON) 30 MG tablet Take 1 tablet (30 mg total) by mouth at bedtime. 30 tablet 5  . sildenafil (REVATIO) 20 MG tablet Take 3 tablets (60 mg total) by mouth daily as needed. 50 tablet 3  . sildenafil (VIAGRA) 100 MG tablet Take 0.5-1 tablets (50-100 mg total) by mouth daily as needed for erectile  dysfunction. 30 tablet 3  . valACYclovir (VALTREX) 500 MG tablet Take 1 tablet (500 mg total) by mouth 2 (two) times daily. 6 tablet 3  . vortioxetine HBr (TRINTELLIX) 20 MG TABS tablet Take 1 tablet (20 mg total) by mouth daily. (Patient taking differently: Take 30 mg by mouth daily. ) 30 tablet 11   No current facility-administered medications on file prior to visit.    No Known Allergies Social History   Socioeconomic History  . Marital status: Single    Spouse name: Not on file  . Number of children: Not on file  . Years of education: Not on file  . Highest education level: Not on file  Occupational History  . Occupation: Archivistcollege student  Social Needs  . Financial resource strain: Not on file  . Food insecurity:    Worry: Not on file    Inability: Not on file  . Transportation needs:    Medical: Not on file    Non-medical: Not on file  Tobacco Use  . Smoking status: Former Smoker    Packs/day: 0.50    Years: 3.00    Pack years: 1.50    Types: Cigarettes    Last attempt to quit: 02/26/2008    Years since quitting: 9.9  . Smokeless tobacco: Never Used  Substance and Sexual Activity  . Alcohol  use: Yes    Alcohol/week: 12.0 standard drinks    Types: 12 Cans of beer per week    Comment: week  . Drug use: No  . Sexual activity: Not on file  Lifestyle  . Physical activity:    Days per week: Not on file    Minutes per session: Not on file  . Stress: Not on file  Relationships  . Social connections:    Talks on phone: Not on file    Gets together: Not on file    Attends religious service: Not on file    Active member of club or organization: Not on file    Attends meetings of clubs or organizations: Not on file    Relationship status: Not on file  . Intimate partner violence:    Fear of current or ex partner: Not on file    Emotionally abused: Not on file    Physically abused: Not on file    Forced sexual activity: Not on file  Other Topics Concern  . Not on file   Social History Narrative  . Not on file   Family History  Problem Relation Age of Onset  . Allergic rhinitis Mother   . Cancer Maternal Grandfather        bone and prostate     Review of Systems  Musculoskeletal: Positive for back pain.  All other systems reviewed and are negative.      Objective:   Physical Exam  Constitutional: He appears well-developed and well-nourished. No distress.  Cardiovascular: Normal rate, regular rhythm, normal heart sounds and intact distal pulses. Exam reveals no gallop and no friction rub.  No murmur heard. Pulmonary/Chest: Effort normal and breath sounds normal. No respiratory distress. He has no wheezes. He has no rales. He exhibits no tenderness.  Musculoskeletal:     Lumbar back: He exhibits pain. He exhibits normal range of motion, no tenderness, no bony tenderness and no spasm.       Back:  Skin: He is not diaphoretic.  Psychiatric: He has a normal mood and affect. His behavior is normal. Thought content normal.  Vitals reviewed.         Assessment & Plan:   Midline low back pain without sciatica, unspecified chronicity - Plan: DG Lumbar Spine Complete  I believe this is most likely muscular pain.  I recommended an x-ray of the lumbar spine to evaluate further.  If x-ray is normal, I would recommend physical therapy.  Meanwhile he can use meloxicam 15 mg a day as an anti-inflammatory as well as Zanaflex 4 mg every 8 hours as needed for muscle spasms or muscle tightness.

## 2018-02-09 ENCOUNTER — Other Ambulatory Visit: Payer: Self-pay | Admitting: Family Medicine

## 2018-02-09 DIAGNOSIS — M545 Low back pain, unspecified: Secondary | ICD-10-CM

## 2018-02-10 ENCOUNTER — Other Ambulatory Visit: Payer: Self-pay | Admitting: Family Medicine

## 2018-02-10 DIAGNOSIS — M549 Dorsalgia, unspecified: Secondary | ICD-10-CM

## 2018-02-11 DIAGNOSIS — F4320 Adjustment disorder, unspecified: Secondary | ICD-10-CM | POA: Diagnosis not present

## 2018-02-11 DIAGNOSIS — F332 Major depressive disorder, recurrent severe without psychotic features: Secondary | ICD-10-CM | POA: Diagnosis not present

## 2018-02-13 DIAGNOSIS — F4320 Adjustment disorder, unspecified: Secondary | ICD-10-CM | POA: Diagnosis not present

## 2018-02-13 DIAGNOSIS — F332 Major depressive disorder, recurrent severe without psychotic features: Secondary | ICD-10-CM | POA: Diagnosis not present

## 2018-02-16 DIAGNOSIS — F332 Major depressive disorder, recurrent severe without psychotic features: Secondary | ICD-10-CM | POA: Diagnosis not present

## 2018-02-19 DIAGNOSIS — F332 Major depressive disorder, recurrent severe without psychotic features: Secondary | ICD-10-CM | POA: Diagnosis not present

## 2018-02-20 DIAGNOSIS — F332 Major depressive disorder, recurrent severe without psychotic features: Secondary | ICD-10-CM | POA: Diagnosis not present

## 2018-02-21 DIAGNOSIS — F332 Major depressive disorder, recurrent severe without psychotic features: Secondary | ICD-10-CM | POA: Diagnosis not present

## 2018-02-23 DIAGNOSIS — F332 Major depressive disorder, recurrent severe without psychotic features: Secondary | ICD-10-CM | POA: Diagnosis not present

## 2018-02-24 DIAGNOSIS — F332 Major depressive disorder, recurrent severe without psychotic features: Secondary | ICD-10-CM | POA: Diagnosis not present

## 2018-02-26 ENCOUNTER — Ambulatory Visit: Payer: BLUE CROSS/BLUE SHIELD

## 2018-02-26 ENCOUNTER — Other Ambulatory Visit: Payer: Self-pay | Admitting: Family Medicine

## 2018-02-26 DIAGNOSIS — F332 Major depressive disorder, recurrent severe without psychotic features: Secondary | ICD-10-CM | POA: Diagnosis not present

## 2018-02-26 NOTE — Telephone Encounter (Signed)
Pt requesting refill on Adderall      LOV: 02/06/18  LRF:   01/20/18

## 2018-02-27 ENCOUNTER — Other Ambulatory Visit: Payer: Self-pay | Admitting: Family Medicine

## 2018-02-27 DIAGNOSIS — F332 Major depressive disorder, recurrent severe without psychotic features: Secondary | ICD-10-CM | POA: Diagnosis not present

## 2018-02-27 MED ORDER — AMPHETAMINE-DEXTROAMPHET ER 25 MG PO CP24: 25.0000 mg | ORAL_CAPSULE | Freq: Two times a day (BID) | ORAL | 0 refills | Status: DC

## 2018-02-28 DIAGNOSIS — F332 Major depressive disorder, recurrent severe without psychotic features: Secondary | ICD-10-CM | POA: Diagnosis not present

## 2018-03-02 DIAGNOSIS — F332 Major depressive disorder, recurrent severe without psychotic features: Secondary | ICD-10-CM | POA: Diagnosis not present

## 2018-03-03 ENCOUNTER — Ambulatory Visit: Payer: BLUE CROSS/BLUE SHIELD

## 2018-03-04 DIAGNOSIS — F332 Major depressive disorder, recurrent severe without psychotic features: Secondary | ICD-10-CM | POA: Diagnosis not present

## 2018-03-05 DIAGNOSIS — F332 Major depressive disorder, recurrent severe without psychotic features: Secondary | ICD-10-CM | POA: Diagnosis not present

## 2018-03-06 DIAGNOSIS — F332 Major depressive disorder, recurrent severe without psychotic features: Secondary | ICD-10-CM | POA: Diagnosis not present

## 2018-03-09 DIAGNOSIS — F332 Major depressive disorder, recurrent severe without psychotic features: Secondary | ICD-10-CM | POA: Diagnosis not present

## 2018-03-10 DIAGNOSIS — F332 Major depressive disorder, recurrent severe without psychotic features: Secondary | ICD-10-CM | POA: Diagnosis not present

## 2018-03-11 DIAGNOSIS — F332 Major depressive disorder, recurrent severe without psychotic features: Secondary | ICD-10-CM | POA: Diagnosis not present

## 2018-03-12 DIAGNOSIS — F332 Major depressive disorder, recurrent severe without psychotic features: Secondary | ICD-10-CM | POA: Diagnosis not present

## 2018-03-13 DIAGNOSIS — F332 Major depressive disorder, recurrent severe without psychotic features: Secondary | ICD-10-CM | POA: Diagnosis not present

## 2018-03-16 DIAGNOSIS — F332 Major depressive disorder, recurrent severe without psychotic features: Secondary | ICD-10-CM | POA: Diagnosis not present

## 2018-03-17 DIAGNOSIS — F332 Major depressive disorder, recurrent severe without psychotic features: Secondary | ICD-10-CM | POA: Diagnosis not present

## 2018-03-18 DIAGNOSIS — F332 Major depressive disorder, recurrent severe without psychotic features: Secondary | ICD-10-CM | POA: Diagnosis not present

## 2018-03-19 ENCOUNTER — Ambulatory Visit: Payer: BLUE CROSS/BLUE SHIELD | Admitting: Physical Therapy

## 2018-03-19 DIAGNOSIS — F332 Major depressive disorder, recurrent severe without psychotic features: Secondary | ICD-10-CM | POA: Diagnosis not present

## 2018-03-20 ENCOUNTER — Encounter: Payer: Self-pay | Admitting: Family Medicine

## 2018-03-20 DIAGNOSIS — F332 Major depressive disorder, recurrent severe without psychotic features: Secondary | ICD-10-CM | POA: Diagnosis not present

## 2018-03-23 ENCOUNTER — Other Ambulatory Visit: Payer: Self-pay | Admitting: Family Medicine

## 2018-03-23 DIAGNOSIS — M545 Low back pain, unspecified: Secondary | ICD-10-CM

## 2018-03-23 DIAGNOSIS — F332 Major depressive disorder, recurrent severe without psychotic features: Secondary | ICD-10-CM | POA: Diagnosis not present

## 2018-03-23 DIAGNOSIS — M546 Pain in thoracic spine: Principal | ICD-10-CM

## 2018-03-23 DIAGNOSIS — Z0000 Encounter for general adult medical examination without abnormal findings: Secondary | ICD-10-CM | POA: Diagnosis not present

## 2018-03-23 MED ORDER — MELOXICAM 15 MG PO TABS: 15.0000 mg | ORAL_TABLET | Freq: Every day | ORAL | 3 refills | Status: DC

## 2018-03-24 DIAGNOSIS — F332 Major depressive disorder, recurrent severe without psychotic features: Secondary | ICD-10-CM | POA: Diagnosis not present

## 2018-03-25 DIAGNOSIS — F332 Major depressive disorder, recurrent severe without psychotic features: Secondary | ICD-10-CM | POA: Diagnosis not present

## 2018-03-26 DIAGNOSIS — F332 Major depressive disorder, recurrent severe without psychotic features: Secondary | ICD-10-CM | POA: Diagnosis not present

## 2018-03-27 DIAGNOSIS — F332 Major depressive disorder, recurrent severe without psychotic features: Secondary | ICD-10-CM | POA: Diagnosis not present

## 2018-03-30 DIAGNOSIS — F332 Major depressive disorder, recurrent severe without psychotic features: Secondary | ICD-10-CM | POA: Diagnosis not present

## 2018-03-31 ENCOUNTER — Encounter: Payer: Self-pay | Admitting: Family Medicine

## 2018-04-02 DIAGNOSIS — F332 Major depressive disorder, recurrent severe without psychotic features: Secondary | ICD-10-CM | POA: Diagnosis not present

## 2018-04-06 DIAGNOSIS — F332 Major depressive disorder, recurrent severe without psychotic features: Secondary | ICD-10-CM | POA: Diagnosis not present

## 2018-04-07 DIAGNOSIS — F332 Major depressive disorder, recurrent severe without psychotic features: Secondary | ICD-10-CM | POA: Diagnosis not present

## 2018-04-09 DIAGNOSIS — F332 Major depressive disorder, recurrent severe without psychotic features: Secondary | ICD-10-CM | POA: Diagnosis not present

## 2018-04-13 DIAGNOSIS — F332 Major depressive disorder, recurrent severe without psychotic features: Secondary | ICD-10-CM | POA: Diagnosis not present

## 2018-04-14 DIAGNOSIS — F332 Major depressive disorder, recurrent severe without psychotic features: Secondary | ICD-10-CM | POA: Diagnosis not present

## 2018-04-18 DIAGNOSIS — F419 Anxiety disorder, unspecified: Secondary | ICD-10-CM | POA: Diagnosis not present

## 2018-04-18 DIAGNOSIS — F331 Major depressive disorder, recurrent, moderate: Secondary | ICD-10-CM | POA: Diagnosis not present

## 2018-04-18 DIAGNOSIS — F902 Attention-deficit hyperactivity disorder, combined type: Secondary | ICD-10-CM | POA: Diagnosis not present

## 2018-04-21 DIAGNOSIS — F332 Major depressive disorder, recurrent severe without psychotic features: Secondary | ICD-10-CM | POA: Diagnosis not present

## 2018-04-23 DIAGNOSIS — F332 Major depressive disorder, recurrent severe without psychotic features: Secondary | ICD-10-CM | POA: Diagnosis not present

## 2018-04-28 DIAGNOSIS — F332 Major depressive disorder, recurrent severe without psychotic features: Secondary | ICD-10-CM | POA: Diagnosis not present

## 2018-05-05 DIAGNOSIS — F332 Major depressive disorder, recurrent severe without psychotic features: Secondary | ICD-10-CM | POA: Diagnosis not present

## 2018-05-25 DIAGNOSIS — F332 Major depressive disorder, recurrent severe without psychotic features: Secondary | ICD-10-CM | POA: Diagnosis not present

## 2018-06-02 DIAGNOSIS — F332 Major depressive disorder, recurrent severe without psychotic features: Secondary | ICD-10-CM | POA: Diagnosis not present

## 2018-06-03 DIAGNOSIS — F332 Major depressive disorder, recurrent severe without psychotic features: Secondary | ICD-10-CM | POA: Diagnosis not present

## 2018-06-12 ENCOUNTER — Other Ambulatory Visit: Payer: Self-pay

## 2018-06-12 ENCOUNTER — Ambulatory Visit
Admission: RE | Admit: 2018-06-12 | Discharge: 2018-06-12 | Disposition: A | Discharge status: Home / Self Care | Payer: BLUE CROSS/BLUE SHIELD | Source: Ambulatory Visit | Attending: Family Medicine | Admitting: Family Medicine

## 2018-06-12 ENCOUNTER — Encounter: Payer: Self-pay | Admitting: Family Medicine

## 2018-06-12 ENCOUNTER — Ambulatory Visit (INDEPENDENT_AMBULATORY_CARE_PROVIDER_SITE_OTHER): Payer: BLUE CROSS/BLUE SHIELD | Admitting: Family Medicine

## 2018-06-12 VITALS — BP 100/60 | HR 86 | Temp 98.3°F | Resp 18 | Ht 71.0 in | Wt 207.0 lb

## 2018-06-12 DIAGNOSIS — M79671 Pain in right foot: Secondary | ICD-10-CM | POA: Diagnosis not present

## 2018-06-12 NOTE — Progress Notes (Signed)
Subjective:    Patient ID: Terry [name], male    DOB: [birth year], 33 y.o.   MRN: [id number]  HPI Early this week, the patient was riding a unit wheel which is essentially a skateboard with a large central wheel.  Patient fell and landed awkwardly on his right foot.  Since that time, he has had pain located on the dorsum of his right foot over the lateral tarsal metatarsal joint line.  It hurts with bearing weight.  He was not immediately able to bear weight after he fell.  He walks with pain over the dorsal lateral part of his foot.  There is no significant bruising or swelling.  There is no significant tenderness to palpation.  Specifically the navicular bone is not tender.  The fifth metatarsal is not tender.  I am unable to elicit any pain by wiggling his MTP joints.  Palpation of the metatarsals do not elicit any pain.  There is some pain over the cuneiform bones but there is no significant bruising or swelling.  He has full extension and flexion of his ankle without pain and no tenderness to palpation over the medial or lateral malleolus.  Past Medical History:  Diagnosis Date  . ADHD (attention deficit hyperactivity disorder)   . Allergic rhinitis   . Depression    since 8th grade, hospitalized 40 ([place])  . HSV infection    Past Surgical History:  Procedure Laterality Date  . APPENDECTOMY     2013  . LAPAROSCOPIC APPENDECTOMY N/A 02/08/2013   Procedure: APPENDECTOMY LAPAROSCOPIC;  Surgeon: Velora Heckler, MD;  Location: [facility];  Service: General;  Laterality: N/A;  . WISDOM TOOTH EXTRACTION     Current Outpatient Medications on File Prior to Visit  Medication Sig Dispense Refill  . amphetamine-dextroamphetamine (ADDERALL XR) 25 MG 24 hr capsule Take 1 capsule by mouth 2 (two) times daily. 60 capsule 0  . lurasidone (LATUDA) 40 MG TABS tablet Take 40 mg by mouth daily with breakfast.    . mirtazapine (REMERON) 30 MG tablet Take 1 tablet (30 mg total) by mouth at bedtime. 30  tablet 5  . sildenafil (VIAGRA) 100 MG tablet Take 0.5-1 tablets (50-100 mg total) by mouth daily as needed for erectile dysfunction. 30 tablet 3  . valACYclovir (VALTREX) 500 MG tablet Take 1 tablet (500 mg total) by mouth 2 (two) times daily. 6 tablet 3  . vortioxetine HBr (TRINTELLIX) 20 MG TABS tablet Take 1 tablet (20 mg total) by mouth daily. (Patient taking differently: Take 30 mg by mouth daily. ) 30 tablet 11   No current facility-administered medications on file prior to visit.    No Known Allergies Social History   Socioeconomic History  . Marital status: Single    Spouse name: Not on file  . Number of children: Not on file  . Years of education: Not on file  . Highest education level: Not on file  Occupational History  . Occupation: Archivist  Social Needs  . Financial resource strain: Not on file  . Food insecurity:    Worry: Not on file    Inability: Not on file  . Transportation needs:    Medical: Not on file    Non-medical: Not on file  Tobacco Use  . Smoking status: Former Smoker    Packs/day: 0.50    Years: 3.00    Pack years: 1.50    Types: Cigarettes    Last attempt to quit: 02/26/2008    Years  since quitting: 10.2  . Smokeless tobacco: Never Used  Substance and Sexual Activity  . Alcohol use: Yes    Alcohol/week: 12.0 standard drinks    Types: 12 Cans of beer per week    Comment: week  . Drug use: No  . Sexual activity: Not on file  Lifestyle  . Physical activity:    Days per week: Not on file    Minutes per session: Not on file  . Stress: Not on file  Relationships  . Social connections:    Talks on phone: Not on file    Gets together: Not on file    Attends religious service: Not on file    Active member of club or organization: Not on file    Attends meetings of clubs or organizations: Not on file    Relationship status: Not on file  . Intimate partner violence:    Fear of current or ex partner: Not on file    Emotionally abused:  Not on file    Physically abused: Not on file    Forced sexual activity: Not on file  Other Topics Concern  . Not on file  Social History Narrative  . Not on file      Review of Systems  All other systems reviewed and are negative.      Objective:   Physical Exam Vitals signs reviewed.  Constitutional:      General: He is not in acute distress.    Appearance: Normal appearance.  Cardiovascular:     Rate and Rhythm: Normal rate and regular rhythm.     Heart sounds: Normal heart sounds.  Pulmonary:     Effort: Pulmonary effort is normal.     Breath sounds: Normal breath sounds.  Musculoskeletal:     Right foot: Normal range of motion. Tenderness and bony tenderness present. No swelling, deformity or laceration.       Feet:  Neurological:     Mental Status: He is alert.           Assessment & Plan:  Right foot pain - Plan: DG Foot Complete Right  I believe the patient has a midfoot sprain.  However I will send him for an x-ray to rule out a Lisfranc fracture.  I see no evidence of 1/5 metatarsal fracture.  Ankle examination is completely normal.  I have recommended resting and weightbearing as tolerated and keeping the leg elevated.  He can use ice 2-3 times a day as well as ibuprofen to reduce swelling.  I recommended that he wear hard soled shoes with weightbearing.  If there is evidence of fracture, I would recommend a cam walker and nonweightbearing for 2 weeks and then reassess via x-ray.

## 2018-06-30 DIAGNOSIS — F332 Major depressive disorder, recurrent severe without psychotic features: Secondary | ICD-10-CM | POA: Diagnosis not present

## 2018-07-01 ENCOUNTER — Other Ambulatory Visit: Payer: Self-pay | Admitting: Family Medicine

## 2018-07-01 MED ORDER — VALACYCLOVIR HCL 500 MG PO TABS: 500.0000 mg | ORAL_TABLET | Freq: Two times a day (BID) | ORAL | 3 refills | Status: DC

## 2018-07-14 DIAGNOSIS — F332 Major depressive disorder, recurrent severe without psychotic features: Secondary | ICD-10-CM | POA: Diagnosis not present

## 2018-08-05 DIAGNOSIS — F332 Major depressive disorder, recurrent severe without psychotic features: Secondary | ICD-10-CM | POA: Diagnosis not present

## 2018-08-09 DIAGNOSIS — F331 Major depressive disorder, recurrent, moderate: Secondary | ICD-10-CM | POA: Diagnosis not present

## 2018-08-09 DIAGNOSIS — F902 Attention-deficit hyperactivity disorder, combined type: Secondary | ICD-10-CM | POA: Diagnosis not present

## 2018-08-09 DIAGNOSIS — F419 Anxiety disorder, unspecified: Secondary | ICD-10-CM | POA: Diagnosis not present

## 2018-08-11 DIAGNOSIS — F332 Major depressive disorder, recurrent severe without psychotic features: Secondary | ICD-10-CM | POA: Diagnosis not present

## 2018-08-13 DIAGNOSIS — F332 Major depressive disorder, recurrent severe without psychotic features: Secondary | ICD-10-CM | POA: Diagnosis not present

## 2018-08-17 DIAGNOSIS — F332 Major depressive disorder, recurrent severe without psychotic features: Secondary | ICD-10-CM | POA: Diagnosis not present

## 2018-08-19 DIAGNOSIS — F332 Major depressive disorder, recurrent severe without psychotic features: Secondary | ICD-10-CM | POA: Diagnosis not present

## 2018-08-24 DIAGNOSIS — F332 Major depressive disorder, recurrent severe without psychotic features: Secondary | ICD-10-CM | POA: Diagnosis not present

## 2018-08-25 ENCOUNTER — Encounter: Payer: Self-pay | Admitting: Family Medicine

## 2018-08-26 DIAGNOSIS — F332 Major depressive disorder, recurrent severe without psychotic features: Secondary | ICD-10-CM | POA: Diagnosis not present

## 2018-08-26 MED ORDER — AMPHETAMINE-DEXTROAMPHET ER 25 MG PO CP24: 25.0000 mg | ORAL_CAPSULE | Freq: Two times a day (BID) | ORAL | 0 refills | Status: DC

## 2018-08-26 NOTE — Telephone Encounter (Signed)
Pt requesting refill on Adderall    - Pt needs 3 months supply of written prescriptions if ok to refill. (I will print if refill ok'd)   LOV: 06/12/18  LRF:    05/12/18

## 2018-08-31 DIAGNOSIS — F332 Major depressive disorder, recurrent severe without psychotic features: Secondary | ICD-10-CM | POA: Diagnosis not present

## 2018-09-01 DIAGNOSIS — F332 Major depressive disorder, recurrent severe without psychotic features: Secondary | ICD-10-CM | POA: Diagnosis not present

## 2018-09-02 DIAGNOSIS — F332 Major depressive disorder, recurrent severe without psychotic features: Secondary | ICD-10-CM | POA: Diagnosis not present

## 2018-09-07 DIAGNOSIS — F332 Major depressive disorder, recurrent severe without psychotic features: Secondary | ICD-10-CM | POA: Diagnosis not present

## 2018-09-08 DIAGNOSIS — F419 Anxiety disorder, unspecified: Secondary | ICD-10-CM | POA: Diagnosis not present

## 2018-09-08 DIAGNOSIS — F331 Major depressive disorder, recurrent, moderate: Secondary | ICD-10-CM | POA: Diagnosis not present

## 2018-09-08 DIAGNOSIS — F902 Attention-deficit hyperactivity disorder, combined type: Secondary | ICD-10-CM | POA: Diagnosis not present

## 2018-09-09 DIAGNOSIS — F332 Major depressive disorder, recurrent severe without psychotic features: Secondary | ICD-10-CM | POA: Diagnosis not present

## 2018-09-11 DIAGNOSIS — F332 Major depressive disorder, recurrent severe without psychotic features: Secondary | ICD-10-CM | POA: Diagnosis not present

## 2018-09-14 DIAGNOSIS — F332 Major depressive disorder, recurrent severe without psychotic features: Secondary | ICD-10-CM | POA: Diagnosis not present

## 2018-09-15 ENCOUNTER — Encounter: Payer: Self-pay | Admitting: Family Medicine

## 2018-09-16 DIAGNOSIS — F332 Major depressive disorder, recurrent severe without psychotic features: Secondary | ICD-10-CM | POA: Diagnosis not present

## 2018-09-17 ENCOUNTER — Other Ambulatory Visit: Payer: Self-pay | Admitting: Family Medicine

## 2018-09-17 DIAGNOSIS — R6889 Other general symptoms and signs: Secondary | ICD-10-CM

## 2018-09-18 ENCOUNTER — Other Ambulatory Visit: Payer: Self-pay

## 2018-09-18 DIAGNOSIS — R6889 Other general symptoms and signs: Secondary | ICD-10-CM | POA: Diagnosis not present

## 2018-09-18 DIAGNOSIS — Z20822 Contact with and (suspected) exposure to covid-19: Secondary | ICD-10-CM

## 2018-09-21 DIAGNOSIS — F332 Major depressive disorder, recurrent severe without psychotic features: Secondary | ICD-10-CM | POA: Diagnosis not present

## 2018-09-21 LAB — NOVEL CORONAVIRUS, NAA: SARS-CoV-2, NAA: NOT DETECTED

## 2018-09-23 DIAGNOSIS — F332 Major depressive disorder, recurrent severe without psychotic features: Secondary | ICD-10-CM | POA: Diagnosis not present

## 2018-09-24 ENCOUNTER — Encounter: Payer: Self-pay | Admitting: Family Medicine

## 2018-09-24 ENCOUNTER — Ambulatory Visit
Admission: RE | Admit: 2018-09-24 | Discharge: 2018-09-24 | Disposition: A | Discharge status: Home / Self Care | Payer: BC Managed Care – PPO | Source: Ambulatory Visit | Attending: Family Medicine | Admitting: Family Medicine

## 2018-09-24 ENCOUNTER — Other Ambulatory Visit: Payer: Self-pay

## 2018-09-24 ENCOUNTER — Ambulatory Visit (INDEPENDENT_AMBULATORY_CARE_PROVIDER_SITE_OTHER): Payer: BC Managed Care – PPO | Admitting: Family Medicine

## 2018-09-24 VITALS — BP 128/80 | HR 77 | Temp 98.2°F | Resp 18 | Ht 71.0 in | Wt 207.2 lb

## 2018-09-24 DIAGNOSIS — N522 Drug-induced erectile dysfunction: Secondary | ICD-10-CM | POA: Insufficient documentation

## 2018-09-24 DIAGNOSIS — Z5181 Encounter for therapeutic drug level monitoring: Secondary | ICD-10-CM | POA: Diagnosis not present

## 2018-09-24 DIAGNOSIS — R748 Abnormal levels of other serum enzymes: Secondary | ICD-10-CM | POA: Diagnosis not present

## 2018-09-24 DIAGNOSIS — A6000 Herpesviral infection of urogenital system, unspecified: Secondary | ICD-10-CM

## 2018-09-24 DIAGNOSIS — Z13228 Encounter for screening for other metabolic disorders: Secondary | ICD-10-CM

## 2018-09-24 DIAGNOSIS — F4010 Social phobia, unspecified: Secondary | ICD-10-CM

## 2018-09-24 DIAGNOSIS — G4700 Insomnia, unspecified: Secondary | ICD-10-CM

## 2018-09-24 DIAGNOSIS — F988 Other specified behavioral and emotional disorders with onset usually occurring in childhood and adolescence: Secondary | ICD-10-CM

## 2018-09-24 DIAGNOSIS — Z1329 Encounter for screening for other suspected endocrine disorder: Secondary | ICD-10-CM

## 2018-09-24 DIAGNOSIS — Z0001 Encounter for general adult medical examination with abnormal findings: Secondary | ICD-10-CM

## 2018-09-24 DIAGNOSIS — Z130 Encounter for screening for diseases of the blood and blood-forming organs and certain disorders involving the immune mechanism: Secondary | ICD-10-CM

## 2018-09-24 DIAGNOSIS — Z23 Encounter for immunization: Secondary | ICD-10-CM

## 2018-09-24 DIAGNOSIS — Z13220 Encounter for screening for lipoid disorders: Secondary | ICD-10-CM

## 2018-09-24 DIAGNOSIS — F411 Generalized anxiety disorder: Secondary | ICD-10-CM

## 2018-09-24 DIAGNOSIS — M79671 Pain in right foot: Secondary | ICD-10-CM

## 2018-09-24 DIAGNOSIS — S9031XA Contusion of right foot, initial encounter: Secondary | ICD-10-CM | POA: Diagnosis not present

## 2018-09-24 DIAGNOSIS — E663 Overweight: Secondary | ICD-10-CM

## 2018-09-24 DIAGNOSIS — F3130 Bipolar disorder, current episode depressed, mild or moderate severity, unspecified: Secondary | ICD-10-CM

## 2018-09-24 DIAGNOSIS — F339 Major depressive disorder, recurrent, unspecified: Secondary | ICD-10-CM

## 2018-09-24 DIAGNOSIS — Z008 Encounter for other general examination: Secondary | ICD-10-CM

## 2018-09-24 MED ORDER — VALACYCLOVIR HCL 500 MG PO TABS: 500.0000 mg | ORAL_TABLET | Freq: Two times a day (BID) | ORAL | 3 refills | Status: DC

## 2018-09-24 MED ORDER — AMPHET-DEXTROAMPHET 3-BEAD ER 50 MG PO CP24: 50.0000 mg | ORAL_CAPSULE | ORAL | 0 refills | Status: DC

## 2018-09-24 NOTE — Patient Instructions (Signed)

## 2018-09-24 NOTE — Progress Notes (Addendum)
Patient: Terry [name], Male    DOB: [birth year], 33 y.o.   MRN: [id number] Visit Date: 09/24/2018  Today's Provider: Danelle BerryLeisa Zenobia Kuennen, PA-C   Chief Complaint  Patient presents with   Annual Exam    pt is not fasting   Subjective:    Annual physical exam Terry GustBranson S Scorza is a 33 y.o. male who presents today for health maintenance and complete physical. He feels well. He reports exercising occasionally, but working on diet and trying to decrease and simplify meds to avoid weight gain, also trying to decrease alcohol intake. He reports he is sleeping fairly well.  -----------------------------------------------------------------  Over the past year he was loosing weight until a few months he was gaining weight with covid, has less to do, got laid off.  Some ED but gotten a little better, still requires meds, no recent sexual activity for "a while"  He has psychiatrist who handles all his psychiatric meds, he has recently transitioned off of Trintellix to Cymbalta has been trying to decrease doses overall, he has been able to decrease Latuda.  He did a recent cross taper and was in contact with his psychiatrist weekly while this occurred.  He also intentionally got off Ambien to avoid any long-term side effects or dependence.  Because his anxiety has been worse recently with COVID, and his psychiatrist gave him Klonopin to help with both anxiety symptoms and with sleep.  He is sleeping okay.  He also uses trazodone to sleep.  He has been struggling with his moods since COVID and loss of his job.  He feels like with med adjustments and working closely with his psychiatrist, his depression sx are gradually getting better.  He shares dx of bipolar, but "not manic" he is " more depressed" and always has been. Otherwise he gets his Adderall from his PCP, his last refill was a little earlier this month, for some reason he has been getting to 25 mg extended release Adderall and taking them both in the  morning.  He denies any side effects from those medications.  He reports previously elevated LFTs.  He has been drinking only socially for the past several months, he will have 1 or 2 IPAs, does not drink to get drunk, and he will feel extremely bad after drinking even small amounts of beer, with headaches, fatigue, difficulty sleeping.  He wants to see if his liver is okay.  He is concerned that he is at 1 of his highest weight ever but his BMI today is 28.9 in the overweight category.  He denies any new family medical history.   No other concerns or symptoms of note today. He denies any smoking history He denies any hair changes, skin changes, abdominal symptoms, back pain, bowel changes.   Patient also mentions that he kicked a chair about 1 month ago and had pain to his right foot over his first MTP.  Is been sore and a little bit stiff he cannot flex his great toe down but he had no bruising swelling or deformity.  He would like to check on it with a x-ray because he is concerned that maybe he broke it and he wonders if he will get his full range of motion back.  He still has slight bit of tenderness and stiffness.   Review of Systems  Constitutional: Negative.   HENT: Negative.   Eyes: Negative.   Respiratory: Negative.   Cardiovascular: Negative.   Gastrointestinal: Negative.   Endocrine: Negative.  Genitourinary: Negative.   Musculoskeletal: Negative.   Skin: Negative.   Allergic/Immunologic: Negative.   Neurological: Negative.   Hematological: Negative.   Psychiatric/Behavioral: Negative.   All other systems reviewed and are negative.   Social History      He  reports that he quit smoking about 10 years ago. His smoking use included cigarettes. He has a 1.50 pack-year smoking history. He has never used smokeless tobacco. He reports current alcohol use of about 12.0 standard drinks of alcohol per week. He reports that he does not use drugs.       Social History    Socioeconomic History   Marital status: Single    Spouse name: Not on file   Number of children: Not on file   Years of education: Not on file   Highest education level: Not on file  Occupational History   Occupation: college student  Social Needs   Financial resource strain: Not on file   Food insecurity    Worry: Not on file    Inability: Not on file   Transportation needs    Medical: Not on file    Non-medical: Not on file  Tobacco Use   Smoking status: Former Smoker    Packs/day: 0.50    Years: 3.00    Pack years: 1.50    Types: Cigarettes    Quit date: 02/26/2008    Years since quitting: 10.5   Smokeless tobacco: Never Used  Substance and Sexual Activity   Alcohol use: Yes    Alcohol/week: 12.0 standard drinks    Types: 12 Cans of beer per week    Comment: week   Drug use: No   Sexual activity: Not on file  Lifestyle   Physical activity    Days per week: Not on file    Minutes per session: Not on file   Stress: Not on file  Relationships   Social connections    Talks on phone: Not on file    Gets together: Not on file    Attends religious service: Not on file    Active member of club or organization: Not on file    Attends meetings of clubs or organizations: Not on file    Relationship status: Not on file  Other Topics Concern   Not on file  Social History Narrative   Not on file    Past Medical History:  Diagnosis Date   ADHD (attention deficit hyperactivity disorder)    Allergic rhinitis    Depression    since 8th grade, hospitalized [id number] ([place])   HSV infection      Patient Active Problem List   Diagnosis Date Noted   Appendicitis, acute 02/08/2013   Acute appendicitis 02/08/2013   ATTENTION DEFICIT HYPERACTIVITY DISORDER 03/04/2007   Seasonal and perennial allergic rhinitis 03/04/2007    Past Surgical History:  Procedure Laterality Date   APPENDECTOMY     2013   LAPAROSCOPIC APPENDECTOMY N/A 02/08/2013    Procedure: APPENDECTOMY LAPAROSCOPIC;  Surgeon: Velora Hecklerodd M Gerkin, MD;  Location: [facility];  Service: General;  Laterality: N/A;   WISDOM TOOTH EXTRACTION      Family History        Family Status  Relation Name Status   Mother  Alive       allergic rhinitis   Father  Alive   MGF  Deceased        His family history includes Allergic rhinitis in his mother; Cancer in his maternal grandfather.  No Known Allergies   Current Outpatient Medications:    amphetamine-dextroamphetamine (ADDERALL XR) 25 MG 24 hr capsule, Take 1 capsule by mouth 2 (two) times daily., Disp: 60 capsule, Rfl: 0   clonazePAM (KLONOPIN) 0.5 MG tablet, Take 0.5 mg by mouth 2 (two) times daily as needed for anxiety., Disp: , Rfl:    DULoxetine (CYMBALTA) 20 MG capsule, Take 20 mg by mouth daily., Disp: , Rfl:    lurasidone (LATUDA) 40 MG TABS tablet, Take 20 mg by mouth daily with breakfast., Disp: , Rfl:    sildenafil (VIAGRA) 100 MG tablet, Take 0.5-1 tablets (50-100 mg total) by mouth daily as needed for erectile dysfunction., Disp: 30 tablet, Rfl: 3   traZODone (DESYREL) 150 MG tablet, Take by mouth at bedtime., Disp: , Rfl:    valACYclovir (VALTREX) 500 MG tablet, Take 1 tablet (500 mg total) by mouth 2 (two) times daily., Disp: 6 tablet, Rfl: 3   Patient Care Team: Susy Frizzle, MD as PCP - General (Family Medicine)      Objective:   Vitals: BP 128/80    Pulse 77    Temp 98.2 F (36.8 C) (Oral)    Resp 18    Ht 5\' 11"  (1.803 m)    Wt 207 lb 3.2 oz (94 kg)    SpO2 97%    BMI 28.90 kg/m    Vitals:   09/24/18 1032  BP: 128/80  Pulse: 77  Resp: 18  Temp: 98.2 F (36.8 C)  TempSrc: Oral  SpO2: 97%  Weight: 207 lb 3.2 oz (94 kg)  Height: 5\' 11"  (1.803 m)     Physical Exam Vitals signs and nursing note reviewed.  Constitutional:      General: He is not in acute distress.    Appearance: Normal appearance. He is well-developed. He is not ill-appearing or toxic-appearing.  HENT:      Head: Normocephalic and atraumatic.     Jaw: No trismus.     Right Ear: Tympanic membrane, ear canal and external ear normal.     Left Ear: Tympanic membrane, ear canal and external ear normal.     Nose: No mucosal edema or rhinorrhea.     Right Sinus: No maxillary sinus tenderness or frontal sinus tenderness.     Left Sinus: No maxillary sinus tenderness or frontal sinus tenderness.     Mouth/Throat:     Pharynx: Uvula midline. No oropharyngeal exudate, posterior oropharyngeal erythema or uvula swelling.  Eyes:     General: Lids are normal.     Conjunctiva/sclera: Conjunctivae normal.     Pupils: Pupils are equal, round, and reactive to light.  Neck:     Musculoskeletal: Full passive range of motion without pain, normal range of motion and neck supple.     Thyroid: No thyroid mass, thyromegaly or thyroid tenderness.     Trachea: Trachea and phonation normal. No tracheal deviation.  Cardiovascular:     Rate and Rhythm: Regular rhythm.     Pulses: Normal pulses.          Radial pulses are 2+ on the right side and 2+ on the left side.       Posterior tibial pulses are 2+ on the right side and 2+ on the left side.     Heart sounds: Normal heart sounds. No murmur. No friction rub. No gallop.   Pulmonary:     Effort: Pulmonary effort is normal.     Breath sounds: Normal breath sounds. No wheezing, rhonchi  or rales.  Abdominal:     General: Bowel sounds are normal. There is no distension.     Palpations: Abdomen is soft.     Tenderness: There is no abdominal tenderness. There is no guarding or rebound.  Musculoskeletal:     Right foot: Decreased range of motion. Normal capillary refill. No tenderness, bony tenderness, swelling or deformity.     Comments: Right foot with decreased range of motion with first great toe flexion, normal extension, patient points to the distal 1st metatarsal, no deformity, tenderness, swelling edema redness on exam, no first MTP joint tenderness to palpation or  abnormality appreciated.  Normal gait  Lymphadenopathy:     Cervical: No cervical adenopathy.  Skin:    General: Skin is warm and dry.     Capillary Refill: Capillary refill takes less than 2 seconds.     Findings: No rash.  Neurological:     Mental Status: He is alert and oriented to person, place, and time.     Motor: No weakness.     Gait: Gait normal.  Psychiatric:        Attention and Perception: Attention normal.        Mood and Affect: Mood normal.        Speech: Speech normal.        Behavior: Behavior normal. Behavior is cooperative.        Thought Content: Thought content normal. Thought content is not paranoid or delusional. Thought content does not include homicidal or suicidal ideation. Thought content does not include homicidal or suicidal plan.        Cognition and Memory: Cognition normal.        Judgment: Judgment normal.    Orders placed or performed in visit on 09/24/18   EKG 12-Lead   EKG: normal EKG, normal sinus rhythm, there are no previous tracings available for comparison. HR 69, normal axis, no QTc prolongation   Depression Screen PHQ 2/9 Scores 09/24/2018 02/27/2017 02/20/2017 10/11/2016  PHQ - 2 Score 2 2 3 2   PHQ- 9 Score 5 7 14 8    GAD 7 GAD 7 : Generalized Anxiety Score 09/24/2018  Nervous, Anxious, on Edge 2  Control/stop worrying 1  Worry too much - different things 1  Trouble relaxing 0  Restless 0  Easily annoyed or irritable 1  Afraid - awful might happen 0  Total GAD 7 Score 5  Anxiety Difficulty Somewhat difficult      Office Visit from 09/24/2018 in [facility]  Alcohol Use Disorder Identification Test Final Score (AUDIT)  4     see flowsheet - brief advice given, pt largely avoiding right now due to sx when drinking, may be med interactions, will f/up on LFT's.  Pt notes prior problems with alcohol use.  Assessment & Plan:     Routine Health Maintenance and Physical Exam  Discussed health benefits of physical  activity, and encouraged him to engage in regular exercise appropriate for his age and condition.   There is no immunization history for the selected administration types on file for this patient.  Health Maintenance  Topic Date Due   TETANUS/TDAP  03/20/2005   INFLUENZA VACCINE  09/26/2018   HIV Screening  Completed   Needs tetanus booster - will try to given when pt agrees (nurse visit?)  Psychiatric dx updated today according to pt given hx, psychiatrist [place] of state and manages all medications and is in close contact with patient however does  not prescribe extended release Adderall.  Refill at his current dose was given for him today. Med list updated to reflect his recent med changes including Latuda, Cymbalta, Klonopin, trazodone and Adderall.  He has no side effects other than these concern about weight gain and has some erectile dysfunction.  We discussed serotonin syndrome and symptoms suggestive of diet and encouraged monitoring and follow-up if needed.  Did do a screening EKG today to ensure there was no QTC prolongation.      ICD-10-CM   1. Encounter for other general examination  Z00.8 Lipid panel    CBC with Differential/Platelet    COMPLETE METABOLIC PANEL WITH GFR    Hemoglobin A1c    TSH  2. Encounter for medication monitoring  Z51.81 EKG 12-Lead    Lipid panel    COMPLETE METABOLIC PANEL WITH GFR    Hemoglobin A1c   Screening labs for metabolic disorders with long-term use of SSRIs and SNRIs and mood stabilizers, screening EKG to ensure no QTC prolongation  3. Elevated liver enzymes  R74.8 Lipid panel    COMPLETE METABOLIC PANEL WITH GFR   Per patient report, recheck CMP and fasting lipid panel, do not see in chart history, patient will avoid alcohol and Tylenol the meantime while labs are pending  4. Drug-induced erectile dysfunction  N52.2    added to problem list, still mild ED, not currently sexually active  5. ADD (attention deficit disorder)  without hyperactivity  F98.8 Amphet-Dextroamphet 3-Bead ER 50 MG CP24   No side effects of medication, refill put in for next month  6. Recurrent major depression resistant to treatment (HCC)  F33.9   7. Insomnia, unspecified type  G47.00   8. Social anxiety disorder  F40.10   9. Generalized anxiety disorder  F41.1   10. Bipolar affective disorder, current episode depressed, current episode severity unspecified (HCC)  F31.30   11. Right foot pain  M79.671 DG Foot Complete Right   Remote injury pain to distal right first metatarsal, decreased range of motion of right great toe limited flexion, normal extension, sent for x-ray  12. Overweight (BMI 25.0-29.9)  E66.3 Lipid panel    Hemoglobin A1c    TSH   Discussed healthy lifestyle, patient mindful and working on this  13. Screening for deficiency anemia  Z13.0 CBC with Differential/Platelet  14. Screening for lipoid disorders  Z13.220 Lipid panel    COMPLETE METABOLIC PANEL WITH GFR  15. Screening for endocrine, metabolic and immunity disorder  Z13.29 Lipid panel   Z13.228 CBC with Differential/Platelet   Z13.0 COMPLETE METABOLIC PANEL WITH GFR    Hemoglobin A1c    TSH  16. Need for vaccine for DT (diphtheria-tetanus)  Z23    Not done today  17. Genital herpes simplex, unspecified site  A60.00 valACYclovir (VALTREX) 500 MG tablet   about 6 outbreaks in the past, no current lesions, requests refill          Danelle Berry, PA-C 09/24/18 10:44 AM  [facility]

## 2018-09-25 ENCOUNTER — Telehealth: Payer: Self-pay

## 2018-09-25 ENCOUNTER — Other Ambulatory Visit: Payer: BC Managed Care – PPO

## 2018-09-25 DIAGNOSIS — M79673 Pain in unspecified foot: Secondary | ICD-10-CM

## 2018-09-25 NOTE — Telephone Encounter (Signed)
Pt notified Verbalizes understanding 

## 2018-09-28 ENCOUNTER — Other Ambulatory Visit: Payer: Self-pay

## 2018-09-28 ENCOUNTER — Other Ambulatory Visit: Payer: BC Managed Care – PPO

## 2018-09-28 DIAGNOSIS — R748 Abnormal levels of other serum enzymes: Secondary | ICD-10-CM | POA: Diagnosis not present

## 2018-09-28 DIAGNOSIS — Z13220 Encounter for screening for lipoid disorders: Secondary | ICD-10-CM

## 2018-09-28 DIAGNOSIS — Z008 Encounter for other general examination: Secondary | ICD-10-CM | POA: Diagnosis not present

## 2018-09-28 DIAGNOSIS — Z5181 Encounter for therapeutic drug level monitoring: Secondary | ICD-10-CM

## 2018-09-28 DIAGNOSIS — Z130 Encounter for screening for diseases of the blood and blood-forming organs and certain disorders involving the immune mechanism: Secondary | ICD-10-CM

## 2018-09-28 DIAGNOSIS — E663 Overweight: Secondary | ICD-10-CM

## 2018-09-28 DIAGNOSIS — Z1329 Encounter for screening for other suspected endocrine disorder: Secondary | ICD-10-CM

## 2018-09-29 ENCOUNTER — Encounter: Payer: Self-pay | Admitting: Family Medicine

## 2018-09-29 DIAGNOSIS — F331 Major depressive disorder, recurrent, moderate: Secondary | ICD-10-CM | POA: Diagnosis not present

## 2018-09-29 DIAGNOSIS — F902 Attention-deficit hyperactivity disorder, combined type: Secondary | ICD-10-CM | POA: Diagnosis not present

## 2018-09-29 DIAGNOSIS — F419 Anxiety disorder, unspecified: Secondary | ICD-10-CM | POA: Diagnosis not present

## 2018-09-29 LAB — COMPLETE METABOLIC PANEL WITH GFR
AG Ratio: 2.1 (calc) (ref 1.0–2.5)
ALT: 40 U/L (ref 9–46)
AST: 20 U/L (ref 10–40)
Albumin: 4.5 g/dL (ref 3.6–5.1)
Alkaline phosphatase (APISO): 36 U/L (ref 36–130)
BUN/Creatinine Ratio: 27 (calc) — ABNORMAL HIGH (ref 6–22)
BUN: 28 mg/dL — ABNORMAL HIGH (ref 7–25)
CO2: 23 mmol/L (ref 20–32)
Calcium: 9.6 mg/dL (ref 8.6–10.3)
Chloride: 107 mmol/L (ref 98–110)
Creat: 1.04 mg/dL (ref 0.60–1.35)
GFR, Est African American: 110 mL/min/{1.73_m2} (ref >=60)
GFR, Est Non African American: 95 mL/min/{1.73_m2} (ref >=60)
Globulin: 2.1 g/dL (calc) (ref 1.9–3.7)
Glucose, Bld: 101 mg/dL — ABNORMAL HIGH (ref 65–99)
Potassium: 4.8 mmol/L (ref 3.5–5.3)
Sodium: 140 mmol/L (ref 135–146)
Total Bilirubin: 0.4 mg/dL (ref 0.2–1.2)
Total Protein: 6.6 g/dL (ref 6.1–8.1)

## 2018-09-29 LAB — CBC WITH DIFFERENTIAL/PLATELET
Absolute Monocytes: 553 cells/uL (ref 200–950)
Basophils Absolute: 21 cells/uL (ref 0–200)
Basophils Relative: 0.3 %
Eosinophils Absolute: 77 cells/uL (ref 15–500)
Eosinophils Relative: 1.1 %
HCT: 42.8 % (ref 38.5–50.0)
Hemoglobin: 14.5 g/dL (ref 13.2–17.1)
Lymphs Abs: 2499 cells/uL (ref 850–3900)
MCH: 30.6 pg (ref 27.0–33.0)
MCHC: 33.9 g/dL (ref 32.0–36.0)
MCV: 90.3 fL (ref 80.0–100.0)
MPV: 9.6 fL (ref 7.5–12.5)
Monocytes Relative: 7.9 %
Neutro Abs: 3850 cells/uL (ref 1500–7800)
Neutrophils Relative %: 55 %
Platelets: 223 10*3/uL (ref 140–400)
RBC: 4.74 10*6/uL (ref 4.20–5.80)
RDW: 13 % (ref 11.0–15.0)
Total Lymphocyte: 35.7 %
WBC: 7 10*3/uL (ref 3.8–10.8)

## 2018-09-29 LAB — HEMOGLOBIN A1C
Hgb A1c MFr Bld: 5.5 % of total Hgb (ref <5.7)
Mean Plasma Glucose: 111 (calc)
eAG (mmol/L): 6.2 (calc)

## 2018-09-29 LAB — TSH: TSH: 2.58 mIU/L (ref 0.40–4.50)

## 2018-09-29 LAB — LIPID PANEL
Cholesterol: 136 mg/dL (ref <200)
HDL: 52 mg/dL (ref >=40)
LDL Cholesterol (Calc): 71 mg/dL (calc)
Non-HDL Cholesterol (Calc): 84 mg/dL (calc) (ref <130)
Total CHOL/HDL Ratio: 2.6 (calc) (ref <5.0)
Triglycerides: 47 mg/dL (ref <150)

## 2018-09-30 DIAGNOSIS — F332 Major depressive disorder, recurrent severe without psychotic features: Secondary | ICD-10-CM | POA: Diagnosis not present

## 2018-10-02 ENCOUNTER — Encounter: Payer: Self-pay | Admitting: Family Medicine

## 2018-10-02 ENCOUNTER — Other Ambulatory Visit: Payer: Self-pay

## 2018-10-02 ENCOUNTER — Ambulatory Visit (INDEPENDENT_AMBULATORY_CARE_PROVIDER_SITE_OTHER): Payer: BC Managed Care – PPO | Admitting: Family Medicine

## 2018-10-02 VITALS — BP 140/80 | HR 99 | Temp 98.6°F | Resp 16 | Ht 71.0 in | Wt 208.0 lb

## 2018-10-02 DIAGNOSIS — F988 Other specified behavioral and emotional disorders with onset usually occurring in childhood and adolescence: Secondary | ICD-10-CM | POA: Diagnosis not present

## 2018-10-02 DIAGNOSIS — F3130 Bipolar disorder, current episode depressed, mild or moderate severity, unspecified: Secondary | ICD-10-CM | POA: Diagnosis not present

## 2018-10-02 DIAGNOSIS — F339 Major depressive disorder, recurrent, unspecified: Secondary | ICD-10-CM

## 2018-10-02 MED ORDER — SILDENAFIL CITRATE 100 MG PO TABS: 50.0000 mg | ORAL_TABLET | Freq: Every day | ORAL | 3 refills | Status: DC | PRN

## 2018-10-02 NOTE — Progress Notes (Signed)
Subjective:    Patient ID: Terry [name], male    DOB: 01-10-1986, 33 y.o.   MRN: [id number]  Medication Refill   06/25/16 Patient is a a pleasant 33 year old Caucasian male here today to establish care. He recently took a job in [place] and he will be moving to [place] for the next week. Therefore he will likely not follow up here in the future. However I see both of his parents and he would like a primary care provider in the area in case the job in [place] does not work out. 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 In fact he was hospitalized in the past with suicidal ideation while in [place]. However over the last 2-3 years he's been doing remarkably better. He has been having telephone conferences with the psychiatrist in [place] who is helped him substantially. He is currently on high-dose trintellix 20 mg a day and Abilify 10 mg a day. This combination seems to work better for him than any other combination he is tried in the past. He has no bipolar tendencies. He is only taking the Abilify for severe depression. Past medical history is complicated however by attention deficit disorder. He takes Adderall XR 20 mg twice a day. He has been on this dose for many years. Even though he is graduated, he finds it unable to focus to complete regular task. He finds it easy to become distracted. He reports poor organization and poor listening skills without the medication. He is very concerned about starting his new job in [place] without the medication. At the present time he states that his depression is well controlled. He denies any suicidal ideation. He states he still has his good days and his bad days however he hasn't a severe exacerbation in over 3 years. He denies any trouble getting out of bed in the morning. He denies any problems with appetite or energy level. He does report insomnia for which he takes Ambien. He's been taking Ambien  for the last 3 or 4 months. We had a long discussion today about habituation and dependency. I recommended trying to use this medication sparingly.  At that time, my plan was: At the present time I'll make no changes in his dose of medication. Continue Adderall XR 20 mg by mouth every morning and every afternoon. Patient was given 60 tablets and refills for 2 months but postdated. I will continue Trintellix as well as his Abilify until he is able to establish a primary care provider in [place]. I would like him to come in fasting for a CBC, fasting lipid panel, and a CMP given the fact he is taking Abilify. I recommended he try to use Ambien sparingly to avoid habituation and dependency. Recheck in 6 months if not establish with primary care provider or sooner if symptoms arise  10/11/16 Patient states that the Adderall seems to wear off early in the afternoon. He is currently taking Adderall XR 40 mg in the morning. He is taking both 20 mg tablets first thing in morning. The medication works well until about 4:00 and then he feels a "crash" he is unable to focus the rest of the day. He denies any insomnia. He denies any anxiety. He denies any palpitations or tachycardia. From the standpoint of his depression, he is doing overall very well. He is taking Abilify in the morning which might be contributing some to the crash later in the  day. He denies any suicidal ideation, hallucinations, or delusion. He is a little sad recently as he and his girlfriend have ended their relationship but overall he seems to be doing well.  At that time, my plan was: I have recommended the patient begin Adderall XR 25 mg tablets but I want him to take them twice a day and try to spread it out. I like him to take first with breakfast and the second one around 2:00 in the afternoon. I believe that will give him more sustained benefit throughout the day without drastically increasing his dose. I also recommended that he take the  Abilify at night when trying to go to sleep so that hopefully will not contribute to any fatigue or crash later in the day. Recheck in 6 months or as needed.   02/20/17 Patient was admitted to the hospital in [place] in August due to depression. At that time they discontinued Ambien and replace it with Remeron 15 mg a day. Since that time he has done fairly well. However he states that he is never completely felt that the depression went into remission. Recently his girlfriend ended their relationship. As a result his depression has drastically worsened. He also ran out of his Abilify and Remeron more than a week ago. That coupled with the breakup of his girlfriend coupled with the holidays his left him and severe depression. He denies any suicidal ideation or suicidal plan. He denies any hallucinations or delusions. He is not a threat to himself or to anyone else. However he is extremely sad. He tentatively plan to go back to [place] today. However I do not believe this is a good idea. He would be alone in [place] and I would much rather him be here with his family who can left him and can supervise/monitor him. He is in agreement with this plan. He also requests STD testing. He was recently diagnosed with epididymitis after a sexual relationship of the person that he did not know very well. He has not been checked for gonorrhea or chlamydia or HIV and he is requesting that today.  AT that time, my plan was: Increase Remeron to 30 mg a day and recheck here in one week. Do not go back to [place]. The patient will stay here with his family or he can be monitored more closely. Resume history trintellix and his Abilify which he ran out of.  Screen the patient for HIV, gonorrhea, Chlamydia per his request. Reassess the patient in one week  02/27/17 Patient is doing much better today. The depression is much better now that he is back on his medication. He also feels better now that he is  made to the holidays. His parents have been watching him closely over the last week. He feels safe and supported. He certainly feels like he is coming out of the tunnel. From that standpoint I'm very happy today that he is doing better. He continues to complain of some pain in his left testicle. On exam, the left testicle is not swollen. There is no mass or nodule. He does have some pain and tenderness over the epididymis concerning for epididymitis. There is no lymphadenopathy. He also reports erectile dysfunction due to his medication.  At that time, my plan was: I will treat epididymitis with doxycycline 100 mg by mouth twice a day for 10 days. Obtain ultrasound if pain persists. Continue his current medications for his depression as he is doing better. Patient  denies any suicidal ideation and feels safe to return back home to resume work. I encouraged him to find a psychiatrist in [place] for follow-up. I will give the patient Viagra, 50-100 mg by mouth daily when necessary sexual activity. I believe his erectile dysfunction secondary to medication, particularly his Abilify  07/31/17 Patient has since moved back home from [place] and is living with his parents again in [place].  He never established with a psychiatrist in [place] as we had discussed.  Instead he was continuing to see a psychiatrist in the [facility] via telephone.  That psychiatrist started him on Latuda in addition to Abilify.  Patient states that he has had his ups and downs but overall his depression is much better than it was earlier this year.  I question the combination of Latuda and Abilify.  He is not certain whether his psychiatrist intended to keep him on this combination in addition to his Trintellix.  He continues to report erectile dysfunction and decreased libido.  He denies any gynecomastia.  He continues to do well on his current dose of Adderall.  He denies any suicidal thoughts.  He denies any  mania.  He denies any insomnia.  He denies any increase in goal-directed behavior.  He denies any racing thoughts, tangential thoughts.  He has normal speech.  He has normal behavior.  He denies any hallucinations or delusions.  At that time, my plan was: I recommended discontinuation of Abilify given the fact the patient seems to be doing better on Latuda.  I explained to him that I do not use these medications in combination given the fact they are in the same pharmacologic class due to the risk of side effects and polypharmacy.  Therefore I will continue him on his current dose of Latuda in addition to Trintellix.  I will also make no changes in his dose of Adderall for ADD.  However I have recommended that he establish with a local psychiatrist.  As I explained to the patient, I believe his situation is more complex than what I feel comfortable managing and that he needs a psychiatric expert to help manage his medication.  I do not feel that his current method of intermittent follow-up via telephone is sufficient.  Therefore I provided him with the contact information for local psychiatrist, Dr. Betti Cruz and strongly encouraged him to call and make that appointment.  11/03/17 Patient presents today with a variety of concerns.  #1 he has mild swelling in the olecranon bursa of his right elbow.  There is a trace amount of fluid however the quantity of fluid is so small that I do not feel comfortable attempting aspiration as I do not feel that it would be successful.  There is no erythema.  There is no warmth.  There is very minimal discomfort.  Patient states that it has been improving gradually over the last week on its own.  He denies any injury.  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  He is seeing a therapist but he has yet to arrange follow-up with a local psychiatrist.  He is still seeing a psychiatrist in [place].  I explained to the patient  that I do not feel comfortable given the complexity of his depression managing this on my own.  I truly believe that he would benefit having a local psychiatrist that can manage his medications particular given the fact he is on ketamine  injections.  I have again recommended that he establish care with a local psychiatrist who can manage his depression who may be able to provide more appropriate care given the complexity of his case.  Patient understands and states that he will proceed with contacting a local psychiatrist.  #3 he continues to report erectile dysfunction.  He never filled the prescription for Viagra.  He is certainly on multiple medications that may impact his ability to achieve and maintain an erection such as Latuda as well as Trintellix.  At that time, my plan was: Patient has mild olecranon bursitis.  I have recommended over-the-counter Aleve and an elbow sleeve and tincture of time.  I anticipate gradual improvement over the next week.  I will check his baseline lab work per his request particular given the fact he is on an atypical antipsychotic including a CBC, CMP, and a lipid panel.  Given his erectile dysfunction which I believe is medication induced, I will check a testosterone level as he may have hypogonadism contributing.  Meanwhile he can use Viagra 50 to 100 mg p.o. daily as needed erectile dysfunction.  10/02/18 Patient is currently seeing a psychiatrist.  Since I last saw the patient his psychiatrist is started him on lithium 300 mg p.o. twice daily, trazodone 200 mg p.o. nightly, Klonopin 0.5 mg p.o. nightly, and Ambien extended release 12.5 mg p.o. nightly.  Although the patient battles depression they believe that they are managing bipolar.  He does believe that he is having some manic tendencies.  He is really been battling insomnia.  He is also been battling racing thoughts and tangential speech.  He states at times he can become extremely focused and unable to cease  goal-directed behavior.  However he continues to battle depression.  Psychiatrist has now got him on 20 mg of Cymbalta and he states that the depression is better.  The insomnia is slowly improving.  However he feels extremely tired and is questioning if he can increase the Adderall that we prescribe.  He is already taking extended release 25 mg twice daily  Past Medical History:  Diagnosis Date   Acute appendicitis 02/08/2013   ADHD (attention deficit hyperactivity disorder)    Allergic rhinitis    Appendicitis, acute 02/08/2013   Depression    since 8th grade, hospitalized 2015 ([place] [facility])   HSV infection    Past Surgical History:  Procedure Laterality Date   APPENDECTOMY     2013   LAPAROSCOPIC APPENDECTOMY N/A 02/08/2013   Procedure: APPENDECTOMY LAPAROSCOPIC;  Surgeon: Velora Hecklerodd M Gerkin, MD;  Location: [facility];  Service: General;  Laterality: N/A;   WISDOM TOOTH EXTRACTION     Current Outpatient Medications on File Prior to Visit  Medication Sig Dispense Refill   [START ON 10/14/2018] Amphet-Dextroamphet 3-Bead ER 50 MG CP24 Take 50 mg by mouth every morning. 30 capsule 0   clonazePAM (KLONOPIN) 0.5 MG tablet Take 0.5 mg by mouth 2 (two) times daily as needed for anxiety.     DULoxetine (CYMBALTA) 20 MG capsule Take 20 mg by mouth daily.     lithium carbonate (LITHOBID) 300 MG CR tablet Take 300 mg by mouth 2 (two) times daily.      lurasidone (LATUDA) 40 MG TABS tablet Take 20 mg by mouth daily with breakfast.     sildenafil (VIAGRA) 100 MG tablet Take 0.5-1 tablets (50-100 mg total) by mouth daily as needed for erectile dysfunction. 30 tablet 3   traZODone (DESYREL) 150 MG tablet Take by  mouth at bedtime.     valACYclovir (VALTREX) 500 MG tablet Take 1 tablet (500 mg total) by mouth 2 (two) times daily. 6 tablet 3   zolpidem (AMBIEN CR) 12.5 MG CR tablet Take 12.5 mg by mouth at bedtime as needed for sleep.     No current facility-administered medications on file prior  to visit.    No Known Allergies Social History   Socioeconomic History   Marital status: Single    Spouse name: Not on file   Number of children: Not on file   Years of education: Not on file   Highest education level: Not on file  Occupational History   Occupation: college student  Social Needs   Financial resource strain: Not on file   Food insecurity    Worry: Not on file    Inability: Not on file   Transportation needs    Medical: Not on file    Non-medical: Not on file  Tobacco Use   Smoking status: Former Smoker    Packs/day: 0.50    Years: 3.00    Pack years: 1.50    Types: Cigarettes    Quit date: 02/26/2008    Years since quitting: 10.6   Smokeless tobacco: Never Used  Substance and Sexual Activity   Alcohol use: Yes    Alcohol/week: 12.0 standard drinks    Types: 12 Cans of beer per week    Comment: week   Drug use: No   Sexual activity: Not on file  Lifestyle   Physical activity    Days per week: Not on file    Minutes per session: Not on file   Stress: Not on file  Relationships   Social connections    Talks on phone: Not on file    Gets together: Not on file    Attends religious service: Not on file    Active member of club or organization: Not on file    Attends meetings of clubs or organizations: Not on file    Relationship status: Not on file   Intimate partner violence    Fear of current or ex partner: Not on file    Emotionally abused: Not on file    Physically abused: Not on file    Forced sexual activity: Not on file  Other Topics Concern   Not on file  Social History Narrative   Not on file   Family History  Problem Relation Age of Onset   Allergic rhinitis Mother    Cancer Maternal Grandfather        bone and prostate     Review of Systems  All other systems reviewed and are negative.      Objective:   Physical Exam  Constitutional: He appears well-developed and well-nourished. No distress.    Cardiovascular: Normal rate, regular rhythm, normal heart sounds and intact distal pulses. Exam reveals no gallop and no friction rub.  No murmur heard. Pulmonary/Chest: Effort normal and breath sounds normal. No respiratory distress. He has no wheezes. He has no rales. He exhibits no tenderness.  Skin: He is not diaphoretic.  Psychiatric: He has a normal mood and affect. His behavior is normal. Thought content normal.  Vitals reviewed.         Assessment & Plan:   The primary encounter diagnosis was ADD (attention deficit disorder) without hyperactivity. Diagnoses of Recurrent major depression resistant to treatment (HCC) and Bipolar affective disorder, current episode depressed, current episode severity unspecified (HCC) were also pertinent to  this visit. I defer management to a psychiatrist but I would not increase Adderall at this time.  I explained to the patient that I believe his fatigue is most likely due to polypharmacy.  I recommended that we give his body time to adjust to the new medication.  I believe that all the sedatives he is taking is lingering during the day making him feel tired.  I believe increase in Adderall would only exacerbate his difficulty sleeping and likely worsen his anxiety.  Therefore our politely declined to increase the Adderall at this time.  Patient understands.  We also reviewed his recent fasting lab work that he had at his physical.

## 2018-10-05 ENCOUNTER — Ambulatory Visit (INDEPENDENT_AMBULATORY_CARE_PROVIDER_SITE_OTHER): Payer: BC Managed Care – PPO | Admitting: Podiatry

## 2018-10-05 ENCOUNTER — Other Ambulatory Visit: Payer: Self-pay | Admitting: Podiatry

## 2018-10-05 ENCOUNTER — Other Ambulatory Visit: Payer: Self-pay

## 2018-10-05 ENCOUNTER — Ambulatory Visit (INDEPENDENT_AMBULATORY_CARE_PROVIDER_SITE_OTHER): Payer: BC Managed Care – PPO

## 2018-10-05 ENCOUNTER — Encounter: Payer: Self-pay | Admitting: Family Medicine

## 2018-10-05 ENCOUNTER — Ambulatory Visit: Payer: BC Managed Care – PPO

## 2018-10-05 ENCOUNTER — Telehealth: Payer: Self-pay | Admitting: *Deleted

## 2018-10-05 ENCOUNTER — Encounter: Payer: Self-pay | Admitting: Podiatry

## 2018-10-05 VITALS — BP 116/80 | HR 77 | Temp 97.2°F | Resp 16

## 2018-10-05 DIAGNOSIS — M778 Other enthesopathies, not elsewhere classified: Secondary | ICD-10-CM

## 2018-10-05 DIAGNOSIS — S9031XA Contusion of right foot, initial encounter: Secondary | ICD-10-CM

## 2018-10-05 DIAGNOSIS — M205X1 Other deformities of toe(s) (acquired), right foot: Secondary | ICD-10-CM

## 2018-10-05 DIAGNOSIS — M79671 Pain in right foot: Secondary | ICD-10-CM

## 2018-10-05 DIAGNOSIS — M7751 Other enthesopathy of right foot: Secondary | ICD-10-CM

## 2018-10-05 DIAGNOSIS — M779 Enthesopathy, unspecified: Secondary | ICD-10-CM

## 2018-10-05 MED ORDER — DICLOFENAC SODIUM 75 MG PO TBEC: 75.0000 mg | DELAYED_RELEASE_TABLET | Freq: Two times a day (BID) | ORAL | 2 refills | Status: DC

## 2018-10-05 MED ORDER — MELOXICAM 15 MG PO TABS: 15.0000 mg | ORAL_TABLET | Freq: Every day | ORAL | 0 refills | Status: DC

## 2018-10-05 NOTE — Telephone Encounter (Signed)
Valery:  I asked Dr. Paulla Dolly about this. He said for patient to take Meloxicam 15 mg.  Thank you, Juliann Pulse

## 2018-10-05 NOTE — Telephone Encounter (Signed)
I spoke with the pharmacist at [facility] and he stated the meloxicam would have the same effect.

## 2018-10-05 NOTE — Telephone Encounter (Signed)
[facility] staffer states the diclofenac has an interaction with the Lithium increasing it's concentration, would Dr. Paulla Dolly like to change the medication.

## 2018-10-05 NOTE — Progress Notes (Signed)
   Subjective:    Patient ID: Terry [name], male    DOB: 17-May-1985, 33 y.o.   MRN: [id number]  HPI    Review of Systems  All other systems reviewed and are negative.      Objective:   Physical Exam        Assessment & Plan:

## 2018-10-06 NOTE — Telephone Encounter (Signed)
Left message informing pt of Dr. Mellody Drown orders to hold off on the oral antiinflammatory medications and to use ice therapy 3-4 times a day for 15-20 minutes/session protecting the skin from the ice with a light cloth, and to call with concerns.

## 2018-10-06 NOTE — Telephone Encounter (Signed)
Lets hold off on antiinflammatory

## 2018-10-06 NOTE — Progress Notes (Signed)
Subjective:   Patient ID: Terry [name], male   DOB: 33 y.o.   MRN: [id number]   HPI Patient presents stating he is had problems with the big toe of his right foot starting in April after kicking it and then kicked it again in June.  States it hurts on top and then towards the end of the toe and he feels like there is not as much bend in the toe.  Patient does not smoke currently and likes to be active   Review of Systems  All other systems reviewed and are negative.       Objective:  Physical Exam Vitals signs and nursing note reviewed.  Constitutional:      Appearance: He is well-developed.  Pulmonary:     Effort: Pulmonary effort is normal.  Musculoskeletal: Normal range of motion.  Skin:    General: Skin is warm.  Neurological:     Mental Status: He is alert.     Neurovascular status intact muscle strength found to be adequate range of motion within normal limits.  Patient is found to have quite a bit of discomfort in the dorsum of the right foot around the first metatarsal base extending slightly distal and also has moderate range of motion loss first MPJ right with mild swelling but minimal pain and pain in the inner phalangeal joint of the big toe     Assessment:  Patient does appear to have some form of functional hallux limitus but no current bone changes with reduced motion and has discomfort dorsum foot and into the inner phalangeal joint right foot      Plan:  H&P x-rays reviewed and today I went ahead and I did do a sterile prep and injected the dorsal tendon complex 3 mg Kenalog 5 mg Xylocaine and applied a met pad with cut out to take pressure off the first metatarsal head.  We will see results and decide if anything else would be appropriate and I advised him on rigid bottom shoes  X-rays indicate he does have moderate elevation of the first metatarsal segment right with no indications of osteoarthritic condition at this point were no indication of stress  fracture arthritis signed visit

## 2018-10-08 ENCOUNTER — Encounter: Payer: Self-pay | Admitting: Podiatry

## 2018-10-08 ENCOUNTER — Telehealth: Payer: Self-pay | Admitting: *Deleted

## 2018-10-08 NOTE — Telephone Encounter (Signed)
I called pt and informed that occasionally with steroid injection there will be a flare of the symptoms. I told pt that he needed to rest, ice 3-4 times daily for 15-20 minutes/session protecting the skin from the ice with a light cloth and elevate the foot, this usually last 3-5 days. I told pt to call with concerns.

## 2018-10-08 NOTE — Telephone Encounter (Signed)
I called pt and answered on a Telephone message.

## 2018-10-08 NOTE — Telephone Encounter (Signed)
Pt states he received an injection on 10/05/2018 and the right foot is killing him.

## 2018-10-09 ENCOUNTER — Other Ambulatory Visit: Payer: Self-pay | Admitting: Podiatry

## 2018-10-09 ENCOUNTER — Ambulatory Visit (INDEPENDENT_AMBULATORY_CARE_PROVIDER_SITE_OTHER): Payer: BC Managed Care – PPO

## 2018-10-09 ENCOUNTER — Encounter: Payer: Self-pay | Admitting: Family Medicine

## 2018-10-09 ENCOUNTER — Ambulatory Visit (INDEPENDENT_AMBULATORY_CARE_PROVIDER_SITE_OTHER): Payer: BC Managed Care – PPO | Admitting: Podiatry

## 2018-10-09 ENCOUNTER — Encounter: Payer: Self-pay | Admitting: Podiatry

## 2018-10-09 ENCOUNTER — Other Ambulatory Visit: Payer: Self-pay

## 2018-10-09 VITALS — Temp 97.3°F

## 2018-10-09 DIAGNOSIS — M10072 Idiopathic gout, left ankle and foot: Secondary | ICD-10-CM | POA: Diagnosis not present

## 2018-10-09 DIAGNOSIS — M10079 Idiopathic gout, unspecified ankle and foot: Secondary | ICD-10-CM

## 2018-10-09 DIAGNOSIS — M722 Plantar fascial fibromatosis: Secondary | ICD-10-CM

## 2018-10-09 DIAGNOSIS — M1000 Idiopathic gout, unspecified site: Secondary | ICD-10-CM

## 2018-10-09 DIAGNOSIS — M79672 Pain in left foot: Secondary | ICD-10-CM

## 2018-10-12 ENCOUNTER — Other Ambulatory Visit: Payer: Self-pay | Admitting: Family Medicine

## 2018-10-12 DIAGNOSIS — F331 Major depressive disorder, recurrent, moderate: Secondary | ICD-10-CM | POA: Diagnosis not present

## 2018-10-12 DIAGNOSIS — F419 Anxiety disorder, unspecified: Secondary | ICD-10-CM | POA: Diagnosis not present

## 2018-10-12 DIAGNOSIS — F902 Attention-deficit hyperactivity disorder, combined type: Secondary | ICD-10-CM | POA: Diagnosis not present

## 2018-10-12 LAB — RHEUMATOID FACTOR: Rheumatoid fact SerPl-aCnc: <14 IU/mL (ref <14)

## 2018-10-12 LAB — URIC ACID: Uric Acid, Serum: 6.6 mg/dL (ref 4.0–8.0)

## 2018-10-12 LAB — C-REACTIVE PROTEIN: CRP: 1.1 mg/L (ref <8.0)

## 2018-10-12 LAB — ANA: Anti Nuclear Antibody (ANA): NEGATIVE

## 2018-10-12 LAB — SEDIMENTATION RATE: Sed Rate: 2 mm/h (ref 0–15)

## 2018-10-12 MED ORDER — SILDENAFIL CITRATE 20 MG PO TABS: 60.0000 mg | ORAL_TABLET | Freq: Every day | ORAL | 3 refills | Status: DC | PRN

## 2018-10-13 ENCOUNTER — Telehealth: Payer: Self-pay | Admitting: *Deleted

## 2018-10-13 NOTE — Telephone Encounter (Signed)
Pt called for blood work results. 

## 2018-10-14 ENCOUNTER — Ambulatory Visit (INDEPENDENT_AMBULATORY_CARE_PROVIDER_SITE_OTHER): Payer: BC Managed Care – PPO | Admitting: Family Medicine

## 2018-10-14 ENCOUNTER — Telehealth: Payer: Self-pay | Admitting: Family Medicine

## 2018-10-14 ENCOUNTER — Other Ambulatory Visit: Payer: Self-pay

## 2018-10-14 ENCOUNTER — Encounter: Payer: Self-pay | Admitting: Family Medicine

## 2018-10-14 VITALS — BP 124/86 | HR 56 | Temp 98.3°F | Resp 18 | Ht 71.0 in | Wt 203.0 lb

## 2018-10-14 DIAGNOSIS — T50905A Adverse effect of unspecified drugs, medicaments and biological substances, initial encounter: Secondary | ICD-10-CM | POA: Diagnosis not present

## 2018-10-14 DIAGNOSIS — E860 Dehydration: Secondary | ICD-10-CM | POA: Diagnosis not present

## 2018-10-14 DIAGNOSIS — M791 Myalgia, unspecified site: Secondary | ICD-10-CM

## 2018-10-14 DIAGNOSIS — I4510 Unspecified right bundle-branch block: Secondary | ICD-10-CM | POA: Diagnosis not present

## 2018-10-14 LAB — COMPREHENSIVE METABOLIC PANEL
AG Ratio: 2.3 (calc) (ref 1.0–2.5)
ALT: 32 U/L (ref 9–46)
AST: 13 U/L (ref 10–40)
Albumin: 5 g/dL (ref 3.6–5.1)
Alkaline phosphatase (APISO): 38 U/L (ref 36–130)
BUN: 19 mg/dL (ref 7–25)
CO2: 27 mmol/L (ref 20–32)
Calcium: 9.8 mg/dL (ref 8.6–10.3)
Chloride: 104 mmol/L (ref 98–110)
Creat: 0.98 mg/dL (ref 0.60–1.35)
Globulin: 2.2 g/dL (calc) (ref 1.9–3.7)
Glucose, Bld: 100 mg/dL — ABNORMAL HIGH (ref 65–99)
Potassium: 4.6 mmol/L (ref 3.5–5.3)
Sodium: 138 mmol/L (ref 135–146)
Total Bilirubin: 0.7 mg/dL (ref 0.2–1.2)
Total Protein: 7.2 g/dL (ref 6.1–8.1)

## 2018-10-14 LAB — CBC WITH DIFFERENTIAL/PLATELET
Absolute Monocytes: 707 cells/uL (ref 200–950)
Basophils Absolute: 71 cells/uL (ref 0–200)
Basophils Relative: 0.7 %
Eosinophils Absolute: 111 cells/uL (ref 15–500)
Eosinophils Relative: 1.1 %
HCT: 45.4 % (ref 38.5–50.0)
Hemoglobin: 15.4 g/dL (ref 13.2–17.1)
Lymphs Abs: 2889 cells/uL (ref 850–3900)
MCH: 30.7 pg (ref 27.0–33.0)
MCHC: 33.9 g/dL (ref 32.0–36.0)
MCV: 90.4 fL (ref 80.0–100.0)
MPV: 9.4 fL (ref 7.5–12.5)
Monocytes Relative: 7 %
Neutro Abs: 6323 cells/uL (ref 1500–7800)
Neutrophils Relative %: 62.6 %
Platelets: 251 10*3/uL (ref 140–400)
RBC: 5.02 10*6/uL (ref 4.20–5.80)
RDW: 13.1 % (ref 11.0–15.0)
Total Lymphocyte: 28.6 %
WBC: 10.1 10*3/uL (ref 3.8–10.8)

## 2018-10-14 LAB — LITHIUM LEVEL: Lithium Lvl: 0.4 mmol/L — ABNORMAL LOW (ref 0.6–1.2)

## 2018-10-14 LAB — CK: Total CK: 97 U/L (ref 44–196)

## 2018-10-14 NOTE — Progress Notes (Signed)
Subjective:    Patient ID: Terry [name], male    DOB: 08-21-1985, 33 y.o.   MRN: [id number]  Patient presents for Possible side effects to medications (pt just stopped meds this morning - hot flashes, HA x 5 days, diarrhea, dizziness, indigestion, weakness)  Patient here with a host of different symptoms that a compounded over the past week.  Last Monday seen by podiatry for right foot pain thought to be possibly be plantar fasciitis tendinitis.  He was given a steroid injection in his foot. The next day ,Tuesday psychiatrist gave him Geodon having increased depression symptoms. Then the next day on Wednesday his lithium was was doubled to  1200mg .  Thursday- had excrutiating pain in left heel, so he went back to podiatry , had xrays and given another shot of cortisone also drew labs for uric acid rheumatoid factor CRP ANA which came back fairly normal.  His uric acid was borderline at 6.6 but he was already working on low purine diet knowing that he has gout in his family.  That afternoon however he started with nausea, indigestion,diarrhea and muscle spasms and tremors all over as well as, hot cold flashes, headache.  No fever The symptoms progressed throughout the weekend.  He did call his psychiatrist back they stop the Geodon he had been on this for 4 days.  He had a lithium level drawn Monday as he continued to have ongoing symptoms.  After speaking with his psychiatrist today they recommended he come in to be evaluated here and he is actually stopped all of his medications this includes Cymbalta, trazodone, Ambien, Adderall   He Is tolerating some solids but has decreased appetite he is also trying to drink as much fluid as possible still feels like he is dehydrated he is only pain a few times a day    No cough or congestion  Feels a frog in throat but this is not new.  He denies any sinus pressure or drainage No recent use of tylenol or ibuprofen     Review Of Systems:  GEN- +  fatigue, fever, weight loss,weakness, recent illness HEENT- denies eye drainage, change in vision, nasal discharge, CVS- denies chest pain, palpitations RESP- denies SOB, cough, wheeze ABD- denies N/V, +change in stools, abd pain GU- denies dysuria, hematuria, dribbling, incontinence MSK- + joint pain,+ muscle aches, injury Neuro- +headache,  Denies dizziness, syncope, seizure activity       Objective:    BP 124/86   Pulse (!) 56   Temp 98.3 F (36.8 C) (Oral)   Resp 18   Ht 5\' 11"  (1.803 m)   Wt 203 lb (92.1 kg)   SpO2 99%   BMI 28.31 kg/m  GEN- NAD, alert and oriented x3, non toxic appearing but fatigued appearing  HEENT- PERRL, EOMI, non injected sclera, pink conjunctiva, dry MM oropharynx clear Neck- Supple, no thyromegaly, np LAD  CVS- RRR, no murmur RESP-CTAB ABD-NABS,soft,NT,ND EXT- No edema MSK- FROM upper and lower ext , no swelling of joints  Pulses- Radial, DP- 2+ , cap refill  3 sEC   EKG- Sinus bradycardia, incomplete RBBB, EKG compared to July which was nORMAL      Assessment & Plan:      Problem List Items Addressed This Visit    None    Visit Diagnoses    Dehydration    -  Primary   Turns for potential medication interaction as he was given a new medication in the next day his  lithium was doubled he was already on Cymbalta some of his symptoms fit classically with a mild serotonin syndrome.  While this also could be viral illness the timing of everything with the medications makes me lean more towards that.  He is also not had any fever.  We did go ahead and get stat labs today for CBC metabolic panel.  With his myalgias all over I will check a CK level.  We will also repeat a lithium level he has not heard back from his level from Monday.  He is now off of all of his medications which also poses a threat to his mental status.  He states that his psychiatrist will be calling him daily to check in with him but at this time he prefers not to be on anything  at all.  He is aware to go to the emergency room if he does have any suicidal ideations or worsening mood.  It was also noted that he had new incomplete right bundle branch block this can occur in the setting of multiple psychiatric medications.  This could be a change from his 1 in July.  I recommend once this is cleared his system he come back to her office in 2 weeks and have a repeat EKG to see if these changes are still present.  If so then we will proceed with echo  He was given a liter bolus of fluid for his dehydration here in the office.  He can use acetaminophen for headache and myalgias for now.  Pending his labs we will see if this can continue to be treated as an outpatient or if he needs to be seen in the emergency room.   Relevant Orders   CBC with Differential/Platelet   Comprehensive metabolic panel   Myalgia       Relevant Orders   CBC with Differential/Platelet   Comprehensive metabolic panel   CK   Adverse drug interaction with prescription medication       Relevant Orders   Lithium level   EKG 12-Lead (Completed)   Incomplete RBBB          Note: This dictation was prepared with Dragon dictation along with smaller phrase technology. Any transcriptional errors that result from this process are unintentional.

## 2018-10-14 NOTE — Telephone Encounter (Signed)
Pt called and states that he has had some medication changes and pred shots in feet and he has developed some side effects from them. His psychiatrists had made some changes in his medication but then told him to stop them when he developed se and contact his doctor's office. He is having some chest pain (the pain is in his pecs like they are having spasms) nausea and dizziness. No SOB or arm pain. Apt made for today.

## 2018-10-14 NOTE — Telephone Encounter (Signed)
Left message informing pt of Dr. Mellody Drown review of results an reminded pt of his follow up appt.

## 2018-10-14 NOTE — Telephone Encounter (Signed)
Valery:  Dr. Paulla Dolly looked over patient's lab results.   Please call the patient and let them know their labs are normal.  Thank you, Rolly Pancake, CMA (AAMA)

## 2018-10-14 NOTE — Patient Instructions (Signed)
We will call with lab results Keep hyrated F/U 2 weeks Dr. Dennard Schaumann - EKG will be done

## 2018-10-15 NOTE — Progress Notes (Signed)
Subjective:   Patient ID: Terry [name], male   DOB: 33 y.o.   MRN: [id number]   HPI Patient states his right foot is starting to improve but still moderately tender in the left heel has become acutely tender in the plantar aspect and he does not have any injury except he has been putting a lot of pressure on it.   ROS      Objective:  Physical Exam  Neurovascular status intact with patient noted to have exquisite discomfort plantar aspect left heel at the insertional point of the tendon into the calcaneus with inflammation fluid buildup and is noted to have no pain within the calcaneal body currently but severe plantar pain with mild discomfort in the right foot     Assessment:  Appears to be inflammatory but I cannot rule out stress fracture or other pathological condition or systemic inflammatory condition due to the fact this is the second foot that is doing this     Plan:  Patient be x-ray left reviewed and today I did go ahead did a plantar fascial injection after sterile prep at its insertion 3 mg Dexasone Kenalog 5 mg Xylocaine and applied air fracture walker to immobilize and take all pressure off the foot.  Also sent for blood work to rule out any kind of systemic inflammatory condition  X-ray indicates no indications currently of stress fracture or pathology associated with the pain in the left heel

## 2018-10-16 ENCOUNTER — Ambulatory Visit (INDEPENDENT_AMBULATORY_CARE_PROVIDER_SITE_OTHER): Payer: BC Managed Care – PPO | Admitting: Podiatry

## 2018-10-16 ENCOUNTER — Other Ambulatory Visit: Payer: Self-pay

## 2018-10-16 ENCOUNTER — Ambulatory Visit (INDEPENDENT_AMBULATORY_CARE_PROVIDER_SITE_OTHER): Payer: BC Managed Care – PPO

## 2018-10-16 ENCOUNTER — Other Ambulatory Visit: Payer: Self-pay | Admitting: Podiatry

## 2018-10-16 ENCOUNTER — Encounter: Payer: Self-pay | Admitting: Podiatry

## 2018-10-16 VITALS — Temp 98.0°F

## 2018-10-16 DIAGNOSIS — M79672 Pain in left foot: Secondary | ICD-10-CM | POA: Diagnosis not present

## 2018-10-16 DIAGNOSIS — M205X1 Other deformities of toe(s) (acquired), right foot: Secondary | ICD-10-CM | POA: Diagnosis not present

## 2018-10-16 DIAGNOSIS — M722 Plantar fascial fibromatosis: Secondary | ICD-10-CM

## 2018-10-16 NOTE — Progress Notes (Signed)
Subjective:   Patient ID: Terry [name], male   DOB: 33 y.o.   MRN: [id number]   HPI Patient states his feet are feeling much better but he has not been feeling great systemically as he has stopped all his medications and is not been able to sleep   ROS      Objective:  Physical Exam  Neurovascular status intact with significant diminishment of plantar fascial symptomatology left and foot pain right with patient having a normal heel toe gait pattern currently     Assessment:  Acute fasciitis left that is improving with right foot pain that also seems to be improving     Plan:  H&P reviewed both conditions and shoe gear and physical therapy and then reviewed blood work indicating no current indication of systemic inflammatory condition

## 2018-10-19 ENCOUNTER — Ambulatory Visit: Payer: BC Managed Care – PPO | Admitting: Podiatry

## 2018-10-29 DIAGNOSIS — B078 Other viral warts: Secondary | ICD-10-CM | POA: Diagnosis not present

## 2018-11-03 ENCOUNTER — Other Ambulatory Visit: Payer: Self-pay

## 2018-11-03 ENCOUNTER — Ambulatory Visit (INDEPENDENT_AMBULATORY_CARE_PROVIDER_SITE_OTHER): Payer: BC Managed Care – PPO | Admitting: Family Medicine

## 2018-11-03 ENCOUNTER — Encounter: Payer: Self-pay | Admitting: Family Medicine

## 2018-11-03 VITALS — BP 118/64 | HR 86 | Temp 98.5°F | Resp 14 | Ht 71.0 in | Wt 203.0 lb

## 2018-11-03 DIAGNOSIS — R9431 Abnormal electrocardiogram [ECG] [EKG]: Secondary | ICD-10-CM | POA: Diagnosis not present

## 2018-11-03 DIAGNOSIS — I4510 Unspecified right bundle-branch block: Secondary | ICD-10-CM

## 2018-11-03 DIAGNOSIS — S61012A Laceration without foreign body of left thumb without damage to nail, initial encounter: Secondary | ICD-10-CM | POA: Diagnosis not present

## 2018-11-03 MED ORDER — CEPHALEXIN 500 MG PO CAPS: 500.0000 mg | ORAL_CAPSULE | Freq: Two times a day (BID) | ORAL | 0 refills | Status: DC

## 2018-11-03 NOTE — Progress Notes (Signed)
   Subjective:    Patient ID: Terry [name], male    DOB: Jul 30, 1985, 33 y.o.   MRN: [id number]  Patient presents for Follow-up and L Hand Laceration (last TDAP 09/2018)  Patient here for interim follow-up.  At his last visit he had medication reaction causing his symptoms.  He is work with psychiatry as he is now only on trazodone 100 mg at bedtime Adderall 25 mg extended release Valium 10 mg at bedtime.  He feels much better on this combination.  They are considering adding the lithium back but is holding off at this time.  His labs are unremarkable at his visit 2 weeks ago.  He was feeling well with the exception of cutting his finger with dirty  She[place] when he was out harvesting okra this morning.  Did use peroxide and bandage it up. TDAP UTD  At her last visit he did have EKG which showed incomplete right bundle branch block about that this may have been due to medication reaction which is why it is being repeated today. His EKG was normal in July.  Review Of Systems:  GEN- denies fatigue, fever, weight loss,weakness, recent illness HEENT- denies eye drainage, change in vision, nasal discharge, CVS- denies chest pain, palpitations RESP- denies SOB, cough, wheeze ABD- denies N/V, change in stools, abd pain GU- denies dysuria, hematuria, dribbling, incontinence MSK- denies joint pain, muscle aches, injury Neuro- denies headache, dizziness, syncope, seizure activity       Objective:    BP 118/64   Pulse 86   Temp 98.5 F (36.9 C) (Oral)   Resp 14   Ht 5\' 11"  (1.803 m)   Wt 203 lb (92.1 kg)   SpO2 99%   BMI 28.31 kg/m  GEN- NAD, alert and oriented x3 HEENT- PERRL, EOMI, non injected sclera, pink conjunctiva, MMM, oropharynx clear Neck- Supple, no thyromegaly CVS- RRR, no murmur RESP-CTAB Skin Left hand over fat pad of thumb small laceration  1/2 CM long, minimal oozing, NT MSK- FROM THUMB, hand, able to make fist, tendons int act  EXT- No edema Pulses- Radial, DP-  2+    EKG today still shows incomplete right bundle branch block no ST changes normal rate    Assessment & Plan:      Problem List Items Addressed This Visit    None    Visit Diagnoses    Abnormal EKG    -  Primary   Relevant Orders   EKG 12-Lead (Completed)   Incomplete RBBB       may be normal variant but not seen just a few months ago, will have him evaluated by cardiology, no current CP, SOB, but need clearance for his psych meds   Relevant Medications   sildenafil (REVATIO) 20 MG tablet   Laceration of left thumb without foreign body without damage to nail, initial encounter       cleaned with saline solution, steri strip applied, TDAP UTD, given keflex as dirty puncture/lac from garden      Note: This dictation was prepared with Diplomatic Services operational officer dictation along with smaller Company secretary. Any transcriptional errors that result from this process are unintentional.

## 2018-11-03 NOTE — Patient Instructions (Addendum)
Referral to cardiology for evaluation F/U pending evaluation

## 2018-11-12 ENCOUNTER — Encounter: Payer: Self-pay | Admitting: Cardiology

## 2018-11-12 ENCOUNTER — Ambulatory Visit (INDEPENDENT_AMBULATORY_CARE_PROVIDER_SITE_OTHER): Payer: BC Managed Care – PPO | Admitting: Cardiology

## 2018-11-12 ENCOUNTER — Other Ambulatory Visit: Payer: Self-pay

## 2018-11-12 VITALS — BP 140/90 | HR 75 | Ht 71.0 in | Wt 213.7 lb

## 2018-11-12 DIAGNOSIS — R9431 Abnormal electrocardiogram [ECG] [EKG]: Secondary | ICD-10-CM

## 2018-11-12 DIAGNOSIS — R030 Elevated blood-pressure reading, without diagnosis of hypertension: Secondary | ICD-10-CM

## 2018-11-12 DIAGNOSIS — F909 Attention-deficit hyperactivity disorder, unspecified type: Secondary | ICD-10-CM | POA: Diagnosis not present

## 2018-11-12 NOTE — Progress Notes (Signed)
Primary Physician:  [name], MD   Patient ID: Terry [name], male    DOB: 08-Sep-1985, 33 y.o.   MRN: [id number]  Subjective:    Chief Complaint  Patient presents with  . Abnormal ECG  . New Patient (Initial Visit)    HPI: Terry [name]  is a 33 y.o. male  with depression, anxiety disorder, referred to [place] for evaluation of abnormal EKG by Dr. Jeanice Lim.   Patient is asymptomatic. Denies any chest pain, shortness of breath, leg edema, palpitations, no symptoms of claudication. No history of hypertension, hyperlipidemia, diabetes, or thyroid disorders. No family history of heart disease.  He reports that he recently had reaction to some his psychiatric medications, which prompted the EKG. His reaction has since resolved.   He does walk 30-45 minutes daily without exertional difficulties. He is planning to start exercising at the gym soon.   Past Medical History:  Diagnosis Date  . Acute appendicitis 02/08/2013  . ADHD (attention deficit hyperactivity disorder)   . Allergic rhinitis   . Appendicitis, acute 02/08/2013  . Depression    since 8th grade, hospitalized 52 ([place])  . HSV infection     Past Surgical History:  Procedure Laterality Date  . APPENDECTOMY     2013  . LAPAROSCOPIC APPENDECTOMY N/A 02/08/2013   Procedure: APPENDECTOMY LAPAROSCOPIC;  Surgeon: Velora Heckler, MD;  Location: [facility];  Service: General;  Laterality: N/A;  . WISDOM TOOTH EXTRACTION      Social History   Socioeconomic History  . Marital status: Single    Spouse name: Not on file  . Number of children: 0  . Years of education: Not on file  . Highest education level: Not on file  Occupational History  . Occupation: Archivist  Social Needs  . Financial resource strain: Not on file  . Food insecurity    Worry: Not on file    Inability: Not on file  . Transportation needs    Medical: Not on file    Non-medical: Not on file  Tobacco Use  . Smoking status: Former  Smoker    Packs/day: 0.50    Years: 3.00    Pack years: 1.50    Types: Cigarettes    Quit date: 02/26/2008    Years since quitting: 10.7  . Smokeless tobacco: Never Used  Substance and Sexual Activity  . Alcohol use: Yes    Alcohol/week: 12.0 standard drinks    Types: 12 Cans of beer per week    Comment: week  . Drug use: No  . Sexual activity: Not on file  Lifestyle  . Physical activity    Days per week: Not on file    Minutes per session: Not on file  . Stress: Not on file  Relationships  . Social Musician on phone: Not on file    Gets together: Not on file    Attends religious service: Not on file    Active member of club or organization: Not on file    Attends meetings of clubs or organizations: Not on file    Relationship status: Not on file  . Intimate partner violence    Fear of current or ex partner: Not on file    Emotionally abused: Not on file    Physically abused: Not on file    Forced sexual activity: Not on file  Other Topics Concern  . Not on file  Social History Narrative  . Not on  file    Review of Systems  Constitution: Negative for decreased appetite, malaise/fatigue, weight gain and weight loss.  Eyes: Negative for visual disturbance.  Cardiovascular: Negative for chest pain, claudication, dyspnea on exertion, leg swelling, orthopnea, palpitations and syncope.  Respiratory: Negative for hemoptysis and wheezing.   Endocrine: Negative for cold intolerance and heat intolerance.  Hematologic/Lymphatic: Does not bruise/bleed easily.  Skin: Negative for nail changes.  Musculoskeletal: Negative for muscle weakness and myalgias.  Gastrointestinal: Negative for abdominal pain, change in bowel habit, nausea and vomiting.  Neurological: Negative for difficulty with concentration, dizziness, focal weakness and headaches.  Psychiatric/Behavioral: Negative for altered mental status and suicidal ideas.  All other systems reviewed and are negative.      Objective:  Blood pressure 140/90, pulse 75, height 5\' 11"  (1.803 m), weight 213 lb 11.2 oz (96.9 kg), SpO2 96 %. Body mass index is 29.81 kg/m.    Physical Exam  Constitutional: He is oriented to person, place, and time. Vital signs are normal. He appears well-developed and well-nourished.  HENT:  Head: Normocephalic and atraumatic.  Neck: Normal range of motion.  Cardiovascular: Normal rate, regular rhythm, normal heart sounds and intact distal pulses.  Pulmonary/Chest: Effort normal and breath sounds normal. No accessory muscle usage. No respiratory distress.  Abdominal: Soft. Bowel sounds are normal.  Musculoskeletal: Normal range of motion.  Neurological: He is alert and oriented to person, place, and time.  Skin: Skin is warm and dry.  Vitals reviewed.  Radiology: No results found.  Laboratory examination:    CMP Latest Ref Rng & Units 10/14/2018 09/28/2018 11/03/2017  Glucose 65 - 99 mg/dL 914(N100(H) 829(F101(H) 93  BUN 7 - 25 mg/dL 19 62(Z28(H) 21  Creatinine 0.60 - 1.35 mg/dL 3.080.98 6.571.04 8.461.20  Sodium 135 - 146 mmol/L 138 140 142  Potassium 3.5 - 5.3 mmol/L 4.6 4.8 4.4  Chloride 98 - 110 mmol/L 104 107 106  CO2 20 - 32 mmol/L 27 23 25   Calcium 8.6 - 10.3 mg/dL 9.8 9.6 9.6  Total Protein 6.1 - 8.1 g/dL 7.2 6.6 6.9  Total Bilirubin 0.2 - 1.2 mg/dL 0.7 0.4 0.6  Alkaline Phos 40 - 115 U/L - - -  AST 10 - 40 U/L 13 20 21   ALT 9 - 46 U/L 32 40 45   CBC Latest Ref Rng & Units 10/14/2018 09/28/2018 11/03/2017  WBC 3.8 - 10.8 Thousand/uL 10.1 7.0 7.6  Hemoglobin 13.2 - 17.1 g/dL 96.215.4 95.214.5 84.115.0  Hematocrit 38.5 - 50.0 % 45.4 42.8 43.5  Platelets 140 - 400 Thousand/uL 251 223 210   Lipid Panel     Component Value Date/Time   CHOL 136 09/28/2018 0808   TRIG 47 09/28/2018 0808   HDL 52 09/28/2018 0808   CHOLHDL 2.6 09/28/2018 0808   VLDL 12 06/27/2016 0827   LDLCALC 71 09/28/2018 0808   HEMOGLOBIN A1C Lab Results  Component Value Date   HGBA1C 5.5 09/28/2018   MPG 111 09/28/2018    TSH Recent Labs    09/28/18 0808  TSH 2.58    PRN Meds:. Medications Discontinued During This Encounter  Medication Reason  . cephALEXin (KEFLEX) 500 MG capsule Error   Current Meds  Medication Sig  . ADDERALL XR 25 MG 24 hr capsule Take 25 mg by mouth every morning.   . diazepam (VALIUM) 10 MG tablet Take 10 mg by mouth at bedtime as needed.   . lithium 300 MG tablet Take 300 mg by mouth. 900 mg daily  .  sildenafil (REVATIO) 20 MG tablet Take 60-100 mg by mouth daily as needed.  . traZODone (DESYREL) 100 MG tablet Take 100 mg by mouth at bedtime.   . valACYclovir (VALTREX) 1000 MG tablet Take 1,000 mg by mouth 2 (two) times daily as needed.    Cardiac Studies:   none  Assessment:   Nonspecific abnormal electrocardiogram (ECG) (EKG)  Elevated blood pressure reading without diagnosis of hypertension - Plan: EKG 12-Lead  EKG 11/12/2018: Normal sinus rhythm at 77 bpm, normal axis, RSR pattern in V1; otherwise normal EKG.   Recommendations:   Patient is here for evaluation of incomplete right bundle branch block on EKG. Patient is asymptomatic. No history of hypertension, hyperlipidemia, or diabetes. I have reviewed the EKG findings with the patient, has nonspecific RSR pattern in V1, but do not feel that this quite meets the criteria even for incomplete right bundle and would call EKG essentially normal. Given his lack of symptoms and no risk factors, do not feel that he needs further cardiac testing at this point. I have reassured him. I have recommended that he keep an eye on his blood pressure and follow up with PCP regarding this, as his blood pressure is elevated today, but generally normal. Encouraged that he continue with primary prevention measures including regular exercise. Will see him back on a PRN basis.    *I have discussed this case with Dr. Virgina Jock and he personally examined the patient and participated in formulating the plan.*    Miquel Dunn,  MSN, APRN, FNP-C [facility]. [place] Office: [phone] Fax: [phone]

## 2018-11-18 ENCOUNTER — Encounter: Payer: Self-pay | Admitting: Family Medicine

## 2018-11-18 DIAGNOSIS — F332 Major depressive disorder, recurrent severe without psychotic features: Secondary | ICD-10-CM | POA: Diagnosis not present

## 2018-11-19 DIAGNOSIS — F332 Major depressive disorder, recurrent severe without psychotic features: Secondary | ICD-10-CM | POA: Diagnosis not present

## 2018-11-20 DIAGNOSIS — F332 Major depressive disorder, recurrent severe without psychotic features: Secondary | ICD-10-CM | POA: Diagnosis not present

## 2018-11-23 DIAGNOSIS — F332 Major depressive disorder, recurrent severe without psychotic features: Secondary | ICD-10-CM | POA: Diagnosis not present

## 2018-11-24 DIAGNOSIS — F332 Major depressive disorder, recurrent severe without psychotic features: Secondary | ICD-10-CM | POA: Diagnosis not present

## 2018-11-25 DIAGNOSIS — F332 Major depressive disorder, recurrent severe without psychotic features: Secondary | ICD-10-CM | POA: Diagnosis not present

## 2018-11-26 DIAGNOSIS — F332 Major depressive disorder, recurrent severe without psychotic features: Secondary | ICD-10-CM | POA: Diagnosis not present

## 2018-11-27 DIAGNOSIS — F332 Major depressive disorder, recurrent severe without psychotic features: Secondary | ICD-10-CM | POA: Diagnosis not present

## 2018-12-01 DIAGNOSIS — F332 Major depressive disorder, recurrent severe without psychotic features: Secondary | ICD-10-CM | POA: Diagnosis not present

## 2018-12-01 DIAGNOSIS — F909 Attention-deficit hyperactivity disorder, unspecified type: Secondary | ICD-10-CM | POA: Diagnosis not present

## 2018-12-02 DIAGNOSIS — F332 Major depressive disorder, recurrent severe without psychotic features: Secondary | ICD-10-CM | POA: Diagnosis not present

## 2018-12-03 DIAGNOSIS — F332 Major depressive disorder, recurrent severe without psychotic features: Secondary | ICD-10-CM | POA: Diagnosis not present

## 2018-12-04 DIAGNOSIS — F332 Major depressive disorder, recurrent severe without psychotic features: Secondary | ICD-10-CM | POA: Diagnosis not present

## 2018-12-07 DIAGNOSIS — F332 Major depressive disorder, recurrent severe without psychotic features: Secondary | ICD-10-CM | POA: Diagnosis not present

## 2018-12-08 DIAGNOSIS — F332 Major depressive disorder, recurrent severe without psychotic features: Secondary | ICD-10-CM | POA: Diagnosis not present

## 2018-12-10 DIAGNOSIS — F332 Major depressive disorder, recurrent severe without psychotic features: Secondary | ICD-10-CM | POA: Diagnosis not present

## 2018-12-11 DIAGNOSIS — F332 Major depressive disorder, recurrent severe without psychotic features: Secondary | ICD-10-CM | POA: Diagnosis not present

## 2018-12-14 DIAGNOSIS — F332 Major depressive disorder, recurrent severe without psychotic features: Secondary | ICD-10-CM | POA: Diagnosis not present

## 2018-12-15 DIAGNOSIS — F332 Major depressive disorder, recurrent severe without psychotic features: Secondary | ICD-10-CM | POA: Diagnosis not present

## 2018-12-16 DIAGNOSIS — F332 Major depressive disorder, recurrent severe without psychotic features: Secondary | ICD-10-CM | POA: Diagnosis not present

## 2018-12-17 DIAGNOSIS — F332 Major depressive disorder, recurrent severe without psychotic features: Secondary | ICD-10-CM | POA: Diagnosis not present

## 2018-12-18 DIAGNOSIS — F332 Major depressive disorder, recurrent severe without psychotic features: Secondary | ICD-10-CM | POA: Diagnosis not present

## 2018-12-21 DIAGNOSIS — F332 Major depressive disorder, recurrent severe without psychotic features: Secondary | ICD-10-CM | POA: Diagnosis not present

## 2018-12-22 DIAGNOSIS — F902 Attention-deficit hyperactivity disorder, combined type: Secondary | ICD-10-CM | POA: Diagnosis not present

## 2018-12-22 DIAGNOSIS — F331 Major depressive disorder, recurrent, moderate: Secondary | ICD-10-CM | POA: Diagnosis not present

## 2018-12-22 DIAGNOSIS — F332 Major depressive disorder, recurrent severe without psychotic features: Secondary | ICD-10-CM | POA: Diagnosis not present

## 2018-12-22 DIAGNOSIS — F419 Anxiety disorder, unspecified: Secondary | ICD-10-CM | POA: Diagnosis not present

## 2018-12-23 DIAGNOSIS — F332 Major depressive disorder, recurrent severe without psychotic features: Secondary | ICD-10-CM | POA: Diagnosis not present

## 2018-12-24 DIAGNOSIS — F332 Major depressive disorder, recurrent severe without psychotic features: Secondary | ICD-10-CM | POA: Diagnosis not present

## 2018-12-25 DIAGNOSIS — F332 Major depressive disorder, recurrent severe without psychotic features: Secondary | ICD-10-CM | POA: Diagnosis not present

## 2018-12-28 DIAGNOSIS — F332 Major depressive disorder, recurrent severe without psychotic features: Secondary | ICD-10-CM | POA: Diagnosis not present

## 2018-12-29 DIAGNOSIS — F332 Major depressive disorder, recurrent severe without psychotic features: Secondary | ICD-10-CM | POA: Diagnosis not present

## 2018-12-30 DIAGNOSIS — F332 Major depressive disorder, recurrent severe without psychotic features: Secondary | ICD-10-CM | POA: Diagnosis not present

## 2018-12-31 DIAGNOSIS — F332 Major depressive disorder, recurrent severe without psychotic features: Secondary | ICD-10-CM | POA: Diagnosis not present

## 2019-01-01 DIAGNOSIS — F332 Major depressive disorder, recurrent severe without psychotic features: Secondary | ICD-10-CM | POA: Diagnosis not present

## 2019-01-04 DIAGNOSIS — F332 Major depressive disorder, recurrent severe without psychotic features: Secondary | ICD-10-CM | POA: Diagnosis not present

## 2019-01-05 DIAGNOSIS — F332 Major depressive disorder, recurrent severe without psychotic features: Secondary | ICD-10-CM | POA: Diagnosis not present

## 2019-01-06 DIAGNOSIS — F332 Major depressive disorder, recurrent severe without psychotic features: Secondary | ICD-10-CM | POA: Diagnosis not present

## 2019-01-07 DIAGNOSIS — F332 Major depressive disorder, recurrent severe without psychotic features: Secondary | ICD-10-CM | POA: Diagnosis not present

## 2019-01-08 DIAGNOSIS — F332 Major depressive disorder, recurrent severe without psychotic features: Secondary | ICD-10-CM | POA: Diagnosis not present

## 2019-01-26 DIAGNOSIS — Z20828 Contact with and (suspected) exposure to other viral communicable diseases: Secondary | ICD-10-CM | POA: Diagnosis not present

## 2019-02-03 DIAGNOSIS — F331 Major depressive disorder, recurrent, moderate: Secondary | ICD-10-CM | POA: Diagnosis not present

## 2019-02-03 DIAGNOSIS — F902 Attention-deficit hyperactivity disorder, combined type: Secondary | ICD-10-CM | POA: Diagnosis not present

## 2019-02-03 DIAGNOSIS — F419 Anxiety disorder, unspecified: Secondary | ICD-10-CM | POA: Diagnosis not present

## 2019-02-03 DIAGNOSIS — F1210 Cannabis abuse, uncomplicated: Secondary | ICD-10-CM | POA: Diagnosis not present

## 2019-02-10 DIAGNOSIS — F331 Major depressive disorder, recurrent, moderate: Secondary | ICD-10-CM | POA: Diagnosis not present

## 2019-02-10 DIAGNOSIS — F1210 Cannabis abuse, uncomplicated: Secondary | ICD-10-CM | POA: Diagnosis not present

## 2019-03-03 DIAGNOSIS — F332 Major depressive disorder, recurrent severe without psychotic features: Secondary | ICD-10-CM | POA: Diagnosis not present

## 2019-03-05 DIAGNOSIS — Z79899 Other long term (current) drug therapy: Secondary | ICD-10-CM | POA: Diagnosis not present

## 2019-03-05 DIAGNOSIS — E663 Overweight: Secondary | ICD-10-CM | POA: Diagnosis not present

## 2019-03-05 DIAGNOSIS — F411 Generalized anxiety disorder: Secondary | ICD-10-CM | POA: Diagnosis not present

## 2019-03-05 DIAGNOSIS — Z20822 Contact with and (suspected) exposure to covid-19: Secondary | ICD-10-CM | POA: Diagnosis not present

## 2019-03-05 DIAGNOSIS — F315 Bipolar disorder, current episode depressed, severe, with psychotic features: Secondary | ICD-10-CM | POA: Diagnosis not present

## 2019-03-05 DIAGNOSIS — F331 Major depressive disorder, recurrent, moderate: Secondary | ICD-10-CM | POA: Diagnosis not present

## 2019-03-05 DIAGNOSIS — Z87891 Personal history of nicotine dependence: Secondary | ICD-10-CM | POA: Diagnosis not present

## 2019-03-05 DIAGNOSIS — Z6829 Body mass index (BMI) 29.0-29.9, adult: Secondary | ICD-10-CM | POA: Diagnosis not present

## 2019-03-05 DIAGNOSIS — F909 Attention-deficit hyperactivity disorder, unspecified type: Secondary | ICD-10-CM | POA: Diagnosis not present

## 2019-03-08 DIAGNOSIS — G4700 Insomnia, unspecified: Secondary | ICD-10-CM | POA: Diagnosis not present

## 2019-03-08 DIAGNOSIS — F909 Attention-deficit hyperactivity disorder, unspecified type: Secondary | ICD-10-CM | POA: Diagnosis not present

## 2019-03-08 DIAGNOSIS — F315 Bipolar disorder, current episode depressed, severe, with psychotic features: Secondary | ICD-10-CM | POA: Diagnosis not present

## 2019-03-08 DIAGNOSIS — Z79899 Other long term (current) drug therapy: Secondary | ICD-10-CM | POA: Diagnosis not present

## 2019-03-08 DIAGNOSIS — Z87891 Personal history of nicotine dependence: Secondary | ICD-10-CM | POA: Diagnosis not present

## 2019-03-08 DIAGNOSIS — F314 Bipolar disorder, current episode depressed, severe, without psychotic features: Secondary | ICD-10-CM | POA: Diagnosis not present

## 2019-03-08 DIAGNOSIS — F411 Generalized anxiety disorder: Secondary | ICD-10-CM | POA: Diagnosis not present

## 2019-03-10 DIAGNOSIS — F339 Major depressive disorder, recurrent, unspecified: Secondary | ICD-10-CM | POA: Diagnosis not present

## 2019-03-10 DIAGNOSIS — F315 Bipolar disorder, current episode depressed, severe, with psychotic features: Secondary | ICD-10-CM | POA: Diagnosis not present

## 2019-03-10 DIAGNOSIS — F314 Bipolar disorder, current episode depressed, severe, without psychotic features: Secondary | ICD-10-CM | POA: Diagnosis not present

## 2019-03-10 DIAGNOSIS — F419 Anxiety disorder, unspecified: Secondary | ICD-10-CM | POA: Diagnosis not present

## 2019-03-10 DIAGNOSIS — Z79899 Other long term (current) drug therapy: Secondary | ICD-10-CM | POA: Diagnosis not present

## 2019-03-10 DIAGNOSIS — F909 Attention-deficit hyperactivity disorder, unspecified type: Secondary | ICD-10-CM | POA: Diagnosis not present

## 2019-03-12 DIAGNOSIS — F314 Bipolar disorder, current episode depressed, severe, without psychotic features: Secondary | ICD-10-CM | POA: Diagnosis not present

## 2019-03-12 DIAGNOSIS — E663 Overweight: Secondary | ICD-10-CM | POA: Diagnosis not present

## 2019-03-12 DIAGNOSIS — F315 Bipolar disorder, current episode depressed, severe, with psychotic features: Secondary | ICD-10-CM | POA: Diagnosis not present

## 2019-03-12 DIAGNOSIS — Z79899 Other long term (current) drug therapy: Secondary | ICD-10-CM | POA: Diagnosis not present

## 2019-03-12 DIAGNOSIS — Z87891 Personal history of nicotine dependence: Secondary | ICD-10-CM | POA: Diagnosis not present

## 2019-03-12 DIAGNOSIS — F419 Anxiety disorder, unspecified: Secondary | ICD-10-CM | POA: Diagnosis not present

## 2019-03-12 DIAGNOSIS — F909 Attention-deficit hyperactivity disorder, unspecified type: Secondary | ICD-10-CM | POA: Diagnosis not present

## 2019-03-19 DIAGNOSIS — Z79899 Other long term (current) drug therapy: Secondary | ICD-10-CM | POA: Diagnosis not present

## 2019-03-19 DIAGNOSIS — F315 Bipolar disorder, current episode depressed, severe, with psychotic features: Secondary | ICD-10-CM | POA: Diagnosis not present

## 2019-03-19 DIAGNOSIS — F314 Bipolar disorder, current episode depressed, severe, without psychotic features: Secondary | ICD-10-CM | POA: Diagnosis not present

## 2019-03-19 DIAGNOSIS — F331 Major depressive disorder, recurrent, moderate: Secondary | ICD-10-CM | POA: Diagnosis not present

## 2019-03-19 DIAGNOSIS — F339 Major depressive disorder, recurrent, unspecified: Secondary | ICD-10-CM | POA: Diagnosis not present

## 2019-03-24 DIAGNOSIS — E669 Obesity, unspecified: Secondary | ICD-10-CM | POA: Diagnosis not present

## 2019-03-24 DIAGNOSIS — F909 Attention-deficit hyperactivity disorder, unspecified type: Secondary | ICD-10-CM | POA: Diagnosis not present

## 2019-03-24 DIAGNOSIS — Z79899 Other long term (current) drug therapy: Secondary | ICD-10-CM | POA: Diagnosis not present

## 2019-03-24 DIAGNOSIS — F339 Major depressive disorder, recurrent, unspecified: Secondary | ICD-10-CM | POA: Diagnosis not present

## 2019-03-24 DIAGNOSIS — F315 Bipolar disorder, current episode depressed, severe, with psychotic features: Secondary | ICD-10-CM | POA: Diagnosis not present

## 2019-03-24 DIAGNOSIS — Z6829 Body mass index (BMI) 29.0-29.9, adult: Secondary | ICD-10-CM | POA: Diagnosis not present

## 2019-03-24 DIAGNOSIS — Z87891 Personal history of nicotine dependence: Secondary | ICD-10-CM | POA: Diagnosis not present

## 2019-03-24 DIAGNOSIS — F411 Generalized anxiety disorder: Secondary | ICD-10-CM | POA: Diagnosis not present

## 2019-03-26 DIAGNOSIS — F909 Attention-deficit hyperactivity disorder, unspecified type: Secondary | ICD-10-CM | POA: Diagnosis not present

## 2019-03-26 DIAGNOSIS — F419 Anxiety disorder, unspecified: Secondary | ICD-10-CM | POA: Diagnosis not present

## 2019-03-26 DIAGNOSIS — Z6829 Body mass index (BMI) 29.0-29.9, adult: Secondary | ICD-10-CM | POA: Diagnosis not present

## 2019-03-26 DIAGNOSIS — G4700 Insomnia, unspecified: Secondary | ICD-10-CM | POA: Diagnosis not present

## 2019-03-26 DIAGNOSIS — Z87891 Personal history of nicotine dependence: Secondary | ICD-10-CM | POA: Diagnosis not present

## 2019-03-26 DIAGNOSIS — F315 Bipolar disorder, current episode depressed, severe, with psychotic features: Secondary | ICD-10-CM | POA: Diagnosis not present

## 2019-03-26 DIAGNOSIS — E663 Overweight: Secondary | ICD-10-CM | POA: Diagnosis not present

## 2019-03-26 DIAGNOSIS — Z79899 Other long term (current) drug therapy: Secondary | ICD-10-CM | POA: Diagnosis not present

## 2019-03-31 DIAGNOSIS — F411 Generalized anxiety disorder: Secondary | ICD-10-CM | POA: Diagnosis not present

## 2019-03-31 DIAGNOSIS — F909 Attention-deficit hyperactivity disorder, unspecified type: Secondary | ICD-10-CM | POA: Diagnosis not present

## 2019-03-31 DIAGNOSIS — F315 Bipolar disorder, current episode depressed, severe, with psychotic features: Secondary | ICD-10-CM | POA: Diagnosis not present

## 2019-03-31 DIAGNOSIS — G4700 Insomnia, unspecified: Secondary | ICD-10-CM | POA: Diagnosis not present

## 2019-03-31 DIAGNOSIS — F314 Bipolar disorder, current episode depressed, severe, without psychotic features: Secondary | ICD-10-CM | POA: Diagnosis not present

## 2019-03-31 DIAGNOSIS — E663 Overweight: Secondary | ICD-10-CM | POA: Diagnosis not present

## 2019-03-31 DIAGNOSIS — F1290 Cannabis use, unspecified, uncomplicated: Secondary | ICD-10-CM | POA: Diagnosis not present

## 2019-03-31 DIAGNOSIS — Z87891 Personal history of nicotine dependence: Secondary | ICD-10-CM | POA: Diagnosis not present

## 2019-03-31 DIAGNOSIS — Z79899 Other long term (current) drug therapy: Secondary | ICD-10-CM | POA: Diagnosis not present

## 2019-04-02 DIAGNOSIS — F315 Bipolar disorder, current episode depressed, severe, with psychotic features: Secondary | ICD-10-CM | POA: Diagnosis not present

## 2019-05-04 ENCOUNTER — Other Ambulatory Visit: Payer: Self-pay

## 2019-05-04 ENCOUNTER — Ambulatory Visit (INDEPENDENT_AMBULATORY_CARE_PROVIDER_SITE_OTHER): Payer: BC Managed Care – PPO | Admitting: Family Medicine

## 2019-05-04 VITALS — BP 108/70 | HR 65 | Temp 97.6°F | Resp 18 | Ht 71.0 in | Wt 198.0 lb

## 2019-05-04 DIAGNOSIS — F322 Major depressive disorder, single episode, severe without psychotic features: Secondary | ICD-10-CM

## 2019-05-04 MED ORDER — FLUOXETINE HCL 20 MG PO TABS: 20.0000 mg | ORAL_TABLET | Freq: Every day | ORAL | 3 refills | Status: DC

## 2019-05-04 MED ORDER — OLANZAPINE 5 MG PO TABS: 5.0000 mg | ORAL_TABLET | Freq: Every day | ORAL | 1 refills | Status: DC

## 2019-05-04 NOTE — Progress Notes (Signed)
Subjective:    Patient ID: Terry [name], male    DOB: [birth year], 34 y.o.   MRN: [id number]  Medication Refill  Shoulder Pain    06/25/16 Patient is a a pleasant 34 year old Caucasian male here today to establish care. He recently took a job in [place] and he will be moving to [place] for the next week. Therefore he will likely not follow up here in the future. However I see both of his parents and he would like a primary care provider in the area in case the job in [place] does not work out. 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 In fact he was hospitalized in the past with suicidal ideation while in [place]. However over the last 2-3 years he's been doing remarkably better. He has been having telephone conferences with the psychiatrist in [place] who is helped him substantially. He is currently on high-dose trintellix 20 mg a day and Abilify 10 mg a day. This combination seems to work better for him than any other combination he is tried in the past. He has no bipolar tendencies. He is only taking the Abilify for severe depression. Past medical history is complicated however by attention deficit disorder. He takes Adderall XR 20 mg twice a day. He has been on this dose for many years. Even though he is graduated, he finds it unable to focus to complete regular task. He finds it easy to become distracted. He reports poor organization and poor listening skills without the medication. He is very concerned about starting his new job in [place] without the medication. At the present time he states that his depression is well controlled. He denies any suicidal ideation. He states he still has his good days and his bad days however he hasn't a severe exacerbation in over 3 years. He denies any trouble getting out of bed in the morning. He denies any problems with appetite or energy level. He does report insomnia for which he takes Ambien. He's  been taking Ambien for the last 3 or 4 months. We had a long discussion today about habituation and dependency. I recommended trying to use this medication sparingly.  At that time, my plan was: At the present time I'll make no changes in his dose of medication. Continue Adderall XR 20 mg by mouth every morning and every afternoon. Patient was given 60 tablets and refills for 2 months but postdated. I will continue Trintellix as well as his Abilify until he is able to establish a primary care provider in [place]. I would like him to come in fasting for a CBC, fasting lipid panel, and a CMP given the fact he is taking Abilify. I recommended he try to use Ambien sparingly to avoid habituation and dependency. Recheck in 6 months if not establish with primary care provider or sooner if symptoms arise  10/11/16 Patient states that the Adderall seems to wear off early in the afternoon. He is currently taking Adderall XR 40 mg in the morning. He is taking both 20 mg tablets first thing in morning. The medication works well until about 4:00 and then he feels a "crash" he is unable to focus the rest of the day. He denies any insomnia. He denies any anxiety. He denies any palpitations or tachycardia. From the standpoint of his depression, he is doing overall very well. He is taking Abilify in the morning which might be contributing some to the  crash later in the day. He denies any suicidal ideation, hallucinations, or delusion. He is a little sad recently as he and his girlfriend have ended their relationship but overall he seems to be doing well.  At that time, my plan was: I have recommended the patient begin Adderall XR 25 mg tablets but I want him to take them twice a day and try to spread it out. I like him to take first with breakfast and the second one around 2:00 in the afternoon. I believe that will give him more sustained benefit throughout the day without drastically increasing his dose. I also  recommended that he take the Abilify at night when trying to go to sleep so that hopefully will not contribute to any fatigue or crash later in the day. Recheck in 6 months or as needed.   02/20/17 Patient was admitted to the hospital in [place] in August due to depression. At that time they discontinued Ambien and replace it with Remeron 15 mg a day. Since that time he has done fairly well. However he states that he is never completely felt that the depression went into remission. Recently his girlfriend ended their relationship. As a result his depression has drastically worsened. He also ran out of his Abilify and Remeron more than a week ago. That coupled with the breakup of his girlfriend coupled with the holidays his left him and severe depression. He denies any suicidal ideation or suicidal plan. He denies any hallucinations or delusions. He is not a threat to himself or to anyone else. However he is extremely sad. He tentatively plan to go back to [place] today. However I do not believe this is a good idea. He would be alone in [place] and I would much rather him be here with his family who can left him and can supervise/monitor him. He is in agreement with this plan. He also requests STD testing. He was recently diagnosed with epididymitis after a sexual relationship of the person that he did not know very well. He has not been checked for gonorrhea or chlamydia or HIV and he is requesting that today.  AT that time, my plan was: Increase Remeron to 30 mg a day and recheck here in one week. Do not go back to [place]. The patient will stay here with his family or he can be monitored more closely. Resume history trintellix and his Abilify which he ran out of.  Screen the patient for HIV, gonorrhea, Chlamydia per his request. Reassess the patient in one week  02/27/17 Patient is doing much better today. The depression is much better now that he is back on his medication. He  also feels better now that he is made to the holidays. His parents have been watching him closely over the last week. He feels safe and supported. He certainly feels like he is coming out of the tunnel. From that standpoint I'm very happy today that he is doing better. He continues to complain of some pain in his left testicle. On exam, the left testicle is not swollen. There is no mass or nodule. He does have some pain and tenderness over the epididymis concerning for epididymitis. There is no lymphadenopathy. He also reports erectile dysfunction due to his medication.  At that time, my plan was: I will treat epididymitis with doxycycline 100 mg by mouth twice a day for 10 days. Obtain ultrasound if pain persists. Continue his current medications for his depression as he  is doing better. Patient denies any suicidal ideation and feels safe to return back home to resume work. I encouraged him to find a psychiatrist in [place] for follow-up. I will give the patient Viagra, 50-100 mg by mouth daily when necessary sexual activity. I believe his erectile dysfunction secondary to medication, particularly his Abilify  07/31/17 Patient has since moved back home from [place] and is living with his parents again in [place].  He never established with a psychiatrist in [place] as we had discussed.  Instead he was continuing to see a psychiatrist in the [facility] via telephone.  That psychiatrist started him on Latuda in addition to Abilify.  Patient states that he has had his ups and downs but overall his depression is much better than it was earlier this year.  I question the combination of Latuda and Abilify.  He is not certain whether his psychiatrist intended to keep him on this combination in addition to his Trintellix.  He continues to report erectile dysfunction and decreased libido.  He denies any gynecomastia.  He continues to do well on his current dose of Adderall.  He denies any  suicidal thoughts.  He denies any mania.  He denies any insomnia.  He denies any increase in goal-directed behavior.  He denies any racing thoughts, tangential thoughts.  He has normal speech.  He has normal behavior.  He denies any hallucinations or delusions.  AT that time, my plan was: I recommended discontinuation of Abilify given the fact the patient seems to be doing better on Latuda.  I explained to him that I do not use these medications in combination given the fact they are in the same pharmacologic class due to the risk of side effects and polypharmacy.  Therefore I will continue him on his current dose of Latuda in addition to Trintellix.  I will also make no changes in his dose of Adderall for ADD.  However I have recommended that he establish with a local psychiatrist.  As I explained to the patient, I believe his situation is more complex than what I feel comfortable managing and that he needs a psychiatric expert to help manage his medication.  I do not feel that his current method of intermittent follow-up via telephone is sufficient.  Therefore I provided him with the contact information for local psychiatrist, Dr. Betti Cruz and strongly encouraged him to call and make that appointment.  05/04/19 Patient has been working with a psychiatrist in another state battling his depression now for many years.  He states that he has been on virtually every medication that exist.  He states that he has 2 bags of medication at home and none of them have helped him.  He states that he has been on virtually every SSRI.  He is taken Jordan, Rexulti, Seroquel.  He does not recall Zyprexa.  He does remember being on Abilify as well.  He is also taken lithium.  None of these medications provided him any relief.  He even tried TMS therapy on 2 separate occasions with no benefit.  Most recently his psychiatrist referred him for ECT therapy.  He tried this on 2 separate occasions with no benefit.  He feels that he is at  his wits end.  He fears waking up every day due to the persistent, hopeless feeling of depression.  He sees no sign of improvement.  He has been unemployed for more than a year.  He lives at home with his  parents and spends a lot of time with his mother to avoid being lonely.  The depression keeps him from being able to find gainful employment.  He states that he does not have many friends.  He does not have a current relationship.  All of these reasons make him feel hopeless about the future.  He denies mania.  He denies racing thoughts.  He denies insomnia.  He denies any tangential thoughts.  He denies any hallucinations or delusions.  He denies any homicidal ideation.  He denies any plan for suicide although he admits that sometimes he wishes that it would all stop.  He denies any active plan of suicide or intention to harm himself.  He feels scared and somewhat hopeless that his psychiatrist has not been able to help him and he would like a referral to a psychiatrist in the area or at least some plan moving forward to help manage his depression.     Past Medical History:  Diagnosis Date  . Acute appendicitis 02/08/2013  . ADHD (attention deficit hyperactivity disorder)   . Allergic rhinitis   . Appendicitis, acute 02/08/2013  . Depression    since 8th grade, hospitalized 11 ([place])  . HSV infection    Past Surgical History:  Procedure Laterality Date  . APPENDECTOMY     2013  . LAPAROSCOPIC APPENDECTOMY N/A 02/08/2013   Procedure: APPENDECTOMY LAPAROSCOPIC;  Surgeon: Velora Heckler, MD;  Location: [facility];  Service: General;  Laterality: N/A;  . WISDOM TOOTH EXTRACTION     Current Outpatient Medications on File Prior to Visit  Medication Sig Dispense Refill  . ADDERALL XR 25 MG 24 hr capsule Take 25 mg by mouth every morning.     . sildenafil (REVATIO) 20 MG tablet Take 60-100 mg by mouth daily as needed.    . traZODone (DESYREL) 100 MG tablet Take 100 mg by mouth at bedtime.     .  valACYclovir (VALTREX) 1000 MG tablet Take 1,000 mg by mouth 2 (two) times daily as needed.     No current facility-administered medications on file prior to visit.   No Known Allergies Social History   Socioeconomic History  . Marital status: Single    Spouse name: Not on file  . Number of children: 0  . Years of education: Not on file  . Highest education level: Not on file  Occupational History  . Occupation: Archivist  Tobacco Use  . Smoking status: Former Smoker    Packs/day: 0.50    Years: 3.00    Pack years: 1.50    Types: Cigarettes    Quit date: 02/26/2008    Years since quitting: 11.1  . Smokeless tobacco: Never Used  Substance and Sexual Activity  . Alcohol use: Yes    Alcohol/week: 12.0 standard drinks    Types: 12 Cans of beer per week    Comment: week  . Drug use: No  . Sexual activity: Not on file  Other Topics Concern  . Not on file  Social History Narrative  . Not on file   Social Determinants of Health   Financial Resource Strain:   . Difficulty of Paying Living Expenses: Not on file  Food Insecurity:   . Worried About Programme researcher, broadcasting/film/video in the Last Year: Not on file  . Ran Out of Food in the Last Year: Not on file  Transportation Needs:   . Lack of Transportation (Medical): Not on file  . Lack of Transportation (Non-Medical):  Not on file  Physical Activity:   . Days of Exercise per Week: Not on file  . Minutes of Exercise per Session: Not on file  Stress:   . Feeling of Stress : Not on file  Social Connections:   . Frequency of Communication with Friends and Family: Not on file  . Frequency of Social Gatherings with Friends and Family: Not on file  . Attends Religious Services: Not on file  . Active Member of Clubs or Organizations: Not on file  . Attends Banker Meetings: Not on file  . Marital Status: Not on file  Intimate Partner Violence:   . Fear of Current or Ex-Partner: Not on file  . Emotionally Abused: Not on  file  . Physically Abused: Not on file  . Sexually Abused: Not on file   Family History  Problem Relation Age of Onset  . Allergic rhinitis Mother   . Cancer Maternal Grandfather        bone and prostate     Review of Systems  All other systems reviewed and are negative.      Objective:   Physical Exam  Constitutional: He appears well-developed and well-nourished. No distress.  Cardiovascular: Normal rate, regular rhythm, normal heart sounds and intact distal pulses. Exam reveals no gallop and no friction rub.  No murmur heard. Pulmonary/Chest: Effort normal and breath sounds normal. No respiratory distress. He has no wheezes. He has no rales. He exhibits no tenderness.  Skin: He is not diaphoretic.  Psychiatric: His speech is normal and behavior is normal. Judgment and thought content normal. Thought content is not paranoid and not delusional. Cognition and memory are normal. He exhibits a depressed mood. He expresses no homicidal and no suicidal ideation. He expresses no suicidal plans and no homicidal plans.  Tearful on exam  Vitals reviewed.         Assessment & Plan:   Severe major depression without psychotic features [facility])  My heart goes out to this patient.  His depression seems severe although he is not actively suicidal.  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  I will also try to expedite referral to a psychiatrist.  I plan to see the patient back weekly until psychiatry sees the patient.  We contracted today for safety and I encouraged the patient that he should go to the hospital immediately if he begins to feel suicidal or has thoughts of wanting to hurt himself.

## 2019-05-05 ENCOUNTER — Other Ambulatory Visit: Payer: Self-pay | Admitting: *Deleted

## 2019-05-05 ENCOUNTER — Encounter: Payer: Self-pay | Admitting: Family Medicine

## 2019-05-05 DIAGNOSIS — F419 Anxiety disorder, unspecified: Secondary | ICD-10-CM | POA: Diagnosis not present

## 2019-05-05 DIAGNOSIS — F902 Attention-deficit hyperactivity disorder, combined type: Secondary | ICD-10-CM | POA: Diagnosis not present

## 2019-05-05 DIAGNOSIS — F331 Major depressive disorder, recurrent, moderate: Secondary | ICD-10-CM | POA: Diagnosis not present

## 2019-05-05 DIAGNOSIS — Z1329 Encounter for screening for other suspected endocrine disorder: Secondary | ICD-10-CM | POA: Diagnosis not present

## 2019-05-05 DIAGNOSIS — R946 Abnormal results of thyroid function studies: Secondary | ICD-10-CM | POA: Diagnosis not present

## 2019-05-05 MED ORDER — FLUOXETINE HCL 20 MG PO TABS: 20.0000 mg | ORAL_TABLET | Freq: Every day | ORAL | 3 refills | Status: DC

## 2019-05-06 ENCOUNTER — Other Ambulatory Visit: Payer: BC Managed Care – PPO

## 2019-05-06 ENCOUNTER — Other Ambulatory Visit: Payer: Self-pay | Admitting: *Deleted

## 2019-05-06 ENCOUNTER — Other Ambulatory Visit: Payer: Self-pay

## 2019-05-06 DIAGNOSIS — Z1329 Encounter for screening for other suspected endocrine disorder: Secondary | ICD-10-CM

## 2019-05-06 LAB — TSH: TSH: 4.65 mIU/L — ABNORMAL HIGH (ref 0.40–4.50)

## 2019-05-06 MED ORDER — FLUOXETINE HCL 20 MG PO CAPS: 20.0000 mg | ORAL_CAPSULE | Freq: Every day | ORAL | 3 refills | Status: DC

## 2019-05-07 ENCOUNTER — Encounter: Payer: Self-pay | Admitting: Family Medicine

## 2019-05-10 ENCOUNTER — Other Ambulatory Visit: Payer: Self-pay | Admitting: Family Medicine

## 2019-05-10 MED ORDER — LIOTHYRONINE SODIUM 50 MCG PO TABS: 50.0000 ug | ORAL_TABLET | Freq: Every day | ORAL | 0 refills | Status: DC

## 2019-05-11 ENCOUNTER — Other Ambulatory Visit: Payer: Self-pay

## 2019-05-11 ENCOUNTER — Encounter: Payer: Self-pay | Admitting: Family Medicine

## 2019-05-11 ENCOUNTER — Ambulatory Visit (INDEPENDENT_AMBULATORY_CARE_PROVIDER_SITE_OTHER): Payer: BC Managed Care – PPO | Admitting: Family Medicine

## 2019-05-11 VITALS — BP 140/58 | HR 120 | Temp 97.3°F | Resp 14 | Ht 71.0 in | Wt 200.0 lb

## 2019-05-11 DIAGNOSIS — F322 Major depressive disorder, single episode, severe without psychotic features: Secondary | ICD-10-CM

## 2019-05-11 DIAGNOSIS — E038 Other specified hypothyroidism: Secondary | ICD-10-CM

## 2019-05-11 NOTE — Progress Notes (Signed)
Subjective:    Patient ID: Terry [name], male    DOB: [birth year], 34 y.o.   MRN: [id number]  Shoulder Pain   Medication Refill   06/25/16 Patient is a a pleasant 34 year old Caucasian male here today to establish care. He recently took a job in [place] and he will be moving to [place] for the next week. Therefore he will likely not follow up here in the future. However I see both of his parents and he would like a primary care provider in the area in case the job in [place] does not work out. 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 In fact he was hospitalized in the past with suicidal ideation while in [place]. However over the last 2-3 years he's been doing remarkably better. He has been having telephone conferences with the psychiatrist in [place] who is helped him substantially. He is currently on high-dose trintellix 20 mg a day and Abilify 10 mg a day. This combination seems to work better for him than any other combination he is tried in the past. He has no bipolar tendencies. He is only taking the Abilify for severe depression. Past medical history is complicated however by attention deficit disorder. He takes Adderall XR 20 mg twice a day. He has been on this dose for many years. Even though he is graduated, he finds it unable to focus to complete regular task. He finds it easy to become distracted. He reports poor organization and poor listening skills without the medication. He is very concerned about starting his new job in [place] without the medication. At the present time he states that his depression is well controlled. He denies any suicidal ideation. He states he still has his good days and his bad days however he hasn't a severe exacerbation in over 3 years. He denies any trouble getting out of bed in the morning. He denies any problems with appetite or energy level. He does report insomnia for which he takes Ambien. He's  been taking Ambien for the last 3 or 4 months. We had a long discussion today about habituation and dependency. I recommended trying to use this medication sparingly.  At that time, my plan was: At the present time I'll make no changes in his dose of medication. Continue Adderall XR 20 mg by mouth every morning and every afternoon. Patient was given 60 tablets and refills for 2 months but postdated. I will continue Trintellix as well as his Abilify until he is able to establish a primary care provider in [place]. I would like him to come in fasting for a CBC, fasting lipid panel, and a CMP given the fact he is taking Abilify. I recommended he try to use Ambien sparingly to avoid habituation and dependency. Recheck in 6 months if not establish with primary care provider or sooner if symptoms arise  10/11/16 Patient states that the Adderall seems to wear off early in the afternoon. He is currently taking Adderall XR 40 mg in the morning. He is taking both 20 mg tablets first thing in morning. The medication works well until about 4:00 and then he feels a "crash" he is unable to focus the rest of the day. He denies any insomnia. He denies any anxiety. He denies any palpitations or tachycardia. From the standpoint of his depression, he is doing overall very well. He is taking Abilify in the morning which might be contributing some to the  crash later in the day. He denies any suicidal ideation, hallucinations, or delusion. He is a little sad recently as he and his girlfriend have ended their relationship but overall he seems to be doing well.  At that time, my plan was: I have recommended the patient begin Adderall XR 25 mg tablets but I want him to take them twice a day and try to spread it out. I like him to take first with breakfast and the second one around 2:00 in the afternoon. I believe that will give him more sustained benefit throughout the day without drastically increasing his dose. I also  recommended that he take the Abilify at night when trying to go to sleep so that hopefully will not contribute to any fatigue or crash later in the day. Recheck in 6 months or as needed.   02/20/17 Patient was admitted to the hospital in [place] in August due to depression. At that time they discontinued Ambien and replace it with Remeron 15 mg a day. Since that time he has done fairly well. However he states that he is never completely felt that the depression went into remission. Recently his girlfriend ended their relationship. As a result his depression has drastically worsened. He also ran out of his Abilify and Remeron more than a week ago. That coupled with the breakup of his girlfriend coupled with the holidays his left him and severe depression. He denies any suicidal ideation or suicidal plan. He denies any hallucinations or delusions. He is not a threat to himself or to anyone else. However he is extremely sad. He tentatively plan to go back to [place] today. However I do not believe this is a good idea. He would be alone in [place] and I would much rather him be here with his family who can left him and can supervise/monitor him. He is in agreement with this plan. He also requests STD testing. He was recently diagnosed with epididymitis after a sexual relationship of the person that he did not know very well. He has not been checked for gonorrhea or chlamydia or HIV and he is requesting that today.  AT that time, my plan was: Increase Remeron to 30 mg a day and recheck here in one week. Do not go back to [place]. The patient will stay here with his family or he can be monitored more closely. Resume history trintellix and his Abilify which he ran out of.  Screen the patient for HIV, gonorrhea, Chlamydia per his request. Reassess the patient in one week  02/27/17 Patient is doing much better today. The depression is much better now that he is back on his medication. He  also feels better now that he is made to the holidays. His parents have been watching him closely over the last week. He feels safe and supported. He certainly feels like he is coming out of the tunnel. From that standpoint I'm very happy today that he is doing better. He continues to complain of some pain in his left testicle. On exam, the left testicle is not swollen. There is no mass or nodule. He does have some pain and tenderness over the epididymis concerning for epididymitis. There is no lymphadenopathy. He also reports erectile dysfunction due to his medication.  At that time, my plan was: I will treat epididymitis with doxycycline 100 mg by mouth twice a day for 10 days. Obtain ultrasound if pain persists. Continue his current medications for his depression as he  is doing better. Patient denies any suicidal ideation and feels safe to return back home to resume work. I encouraged him to find a psychiatrist in [place] for follow-up. I will give the patient Viagra, 50-100 mg by mouth daily when necessary sexual activity. I believe his erectile dysfunction secondary to medication, particularly his Abilify  07/31/17 Patient has since moved back home from [place] and is living with his parents again in [place].  He never established with a psychiatrist in [place] as we had discussed.  Instead he was continuing to see a psychiatrist in the [facility] via telephone.  That psychiatrist started him on Latuda in addition to Abilify.  Patient states that he has had his ups and downs but overall his depression is much better than it was earlier this year.  I question the combination of Latuda and Abilify.  He is not certain whether his psychiatrist intended to keep him on this combination in addition to his Trintellix.  He continues to report erectile dysfunction and decreased libido.  He denies any gynecomastia.  He continues to do well on his current dose of Adderall.  He denies any  suicidal thoughts.  He denies any mania.  He denies any insomnia.  He denies any increase in goal-directed behavior.  He denies any racing thoughts, tangential thoughts.  He has normal speech.  He has normal behavior.  He denies any hallucinations or delusions.  AT that time, my plan was: I recommended discontinuation of Abilify given the fact the patient seems to be doing better on Latuda.  I explained to him that I do not use these medications in combination given the fact they are in the same pharmacologic class due to the risk of side effects and polypharmacy.  Therefore I will continue him on his current dose of Latuda in addition to Trintellix.  I will also make no changes in his dose of Adderall for ADD.  However I have recommended that he establish with a local psychiatrist.  As I explained to the patient, I believe his situation is more complex than what I feel comfortable managing and that he needs a psychiatric expert to help manage his medication.  I do not feel that his current method of intermittent follow-up via telephone is sufficient.  Therefore I provided him with the contact information for local psychiatrist, Dr. Betti Cruz and strongly encouraged him to call and make that appointment.  05/04/19 Patient has been working with a psychiatrist in another state battling his depression now for many years.  He states that he has been on virtually every medication that exist.  He states that he has 2 bags of medication at home and none of them have helped him.  He states that he has been on virtually every SSRI.  He is taken Jordan, Rexulti, Seroquel.  He does not recall Zyprexa.  He does remember being on Abilify as well.  He is also taken lithium.  None of these medications provided him any relief.  He even tried TMS therapy on 2 separate occasions with no benefit.  Most recently his psychiatrist referred him for ECT therapy.  He tried this on 2 separate occasions with no benefit.  He feels that he is at  his wits end.  He fears waking up every day due to the persistent, hopeless feeling of depression.  He sees no sign of improvement.  He has been unemployed for more than a year.  He lives at home with his  parents and spends a lot of time with his mother to avoid being lonely.  The depression keeps him from being able to find gainful employment.  He states that he does not have many friends.  He does not have a current relationship.  All of these reasons make him feel hopeless about the future.  He denies mania.  He denies racing thoughts.  He denies insomnia.  He denies any tangential thoughts.  He denies any hallucinations or delusions.  He denies any homicidal ideation.  He denies any plan for suicide although he admits that sometimes he wishes that it would all stop.  He denies any active plan of suicide or intention to harm himself.  He feels scared and somewhat hopeless that his psychiatrist has not been able to help him and he would like a referral to a psychiatrist in the area or at least some plan moving forward to help manage his depression.  At that time, my plan was: My heart goes out to this patient.  His depression seems severe although he is not actively suicidal.  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  I will also try to expedite referral to a psychiatrist.  I plan to see the patient back weekly until psychiatry sees the patient.  We contracted today for safety and I encouraged the patient that he should go to the hospital immediately if he begins to feel suicidal or has thoughts of wanting to hurt himself.  05/11/19  Thankfully, the patient seems to be doing much better this week.  He states that he feels better.  He realizes the medication does not take effect back quickly.  There have been some other social situations that have helped but overall he seems to be much healthier place today.  He feels better.  He is not  contemplating suicide and denies any suicidal plans or thoughts.  Obviously there is still a long way to go but there definitely has been improvement.  His TSH was mildly elevated at 4.65.  The patient was taking Cytomel 80 mcg a day.  I question if his previous psychiatrist was using this off label to help treat depression or to prevent cognitive impairment from ECT.  We had a long discussion today about possibly increasing the dose which will be difficult as he is on the highest tablet strength or potentially switching to Synthroid.    Past Medical History:  Diagnosis Date  . Acute appendicitis 02/08/2013  . ADHD (attention deficit hyperactivity disorder)   . Allergic rhinitis   . Appendicitis, acute 02/08/2013  . Depression    since 8th grade, hospitalized 31 ([place])  . HSV infection    Past Surgical History:  Procedure Laterality Date  . APPENDECTOMY     2013  . LAPAROSCOPIC APPENDECTOMY N/A 02/08/2013   Procedure: APPENDECTOMY LAPAROSCOPIC;  Surgeon: Velora Heckler, MD;  Location: [facility];  Service: General;  Laterality: N/A;  . WISDOM TOOTH EXTRACTION     Current Outpatient Medications on File Prior to Visit  Medication Sig Dispense Refill  . ADDERALL XR 10 MG 24 hr capsule Take 10 mg by mouth every morning.    . ADDERALL XR 25 MG 24 hr capsule Take 25 mg by mouth every morning.     Marland Kitchen FLUoxetine (PROZAC) 20 MG capsule Take 1 capsule (20 mg total) by mouth daily. 30 capsule 3  . liothyronine (CYTOMEL) 50 MCG tablet  Take 1 tablet (50 mcg total) by mouth daily. (Patient taking differently: Take 50 mcg by mouth daily. ) 30 tablet 0  . OLANZapine (ZYPREXA) 5 MG tablet Take 1 tablet (5 mg total) by mouth at bedtime. 30 tablet 1  . sildenafil (REVATIO) 20 MG tablet Take 60-100 mg by mouth daily as needed.    . traZODone (DESYREL) 100 MG tablet Take 100 mg by mouth at bedtime.     . valACYclovir (VALTREX) 1000 MG tablet Take 1,000 mg by mouth 2 (two) times daily as needed.     No  current facility-administered medications on file prior to visit.   No Known Allergies Social History   Socioeconomic History  . Marital status: Single    Spouse name: Not on file  . Number of children: 0  . Years of education: Not on file  . Highest education level: Not on file  Occupational History  . Occupation: Archivist  Tobacco Use  . Smoking status: Former Smoker    Packs/day: 0.50    Years: 3.00    Pack years: 1.50    Types: Cigarettes    Quit date: 02/26/2008    Years since quitting: 11.2  . Smokeless tobacco: Never Used  Substance and Sexual Activity  . Alcohol use: Yes    Alcohol/week: 12.0 standard drinks    Types: 12 Cans of beer per week    Comment: week  . Drug use: No  . Sexual activity: Not on file  Other Topics Concern  . Not on file  Social History Narrative  . Not on file   Social Determinants of Health   Financial Resource Strain:   . Difficulty of Paying Living Expenses:   Food Insecurity:   . Worried About Programme researcher, broadcasting/film/video in the Last Year:   . Barista in the Last Year:   Transportation Needs:   . Freight forwarder (Medical):   Marland Kitchen Lack of Transportation (Non-Medical):   Physical Activity:   . Days of Exercise per Week:   . Minutes of Exercise per Session:   Stress:   . Feeling of Stress :   Social Connections:   . Frequency of Communication with Friends and Family:   . Frequency of Social Gatherings with Friends and Family:   . Attends Religious Services:   . Active Member of Clubs or Organizations:   . Attends Banker Meetings:   Marland Kitchen Marital Status:   Intimate Partner Violence:   . Fear of Current or Ex-Partner:   . Emotionally Abused:   Marland Kitchen Physically Abused:   . Sexually Abused:    Family History  Problem Relation Age of Onset  . Allergic rhinitis Mother   . Cancer Maternal Grandfather        bone and prostate     Review of Systems  All other systems reviewed and are negative.        Objective:   Physical Exam  Constitutional: He appears well-developed and well-nourished. No distress.  Cardiovascular: Normal rate, regular rhythm, normal heart sounds and intact distal pulses. Exam reveals no gallop and no friction rub.  No murmur heard. Pulmonary/Chest: Effort normal and breath sounds normal. No respiratory distress. He has no wheezes. He has no rales. He exhibits no tenderness.  Skin: He is not diaphoretic.  Psychiatric: He has a normal mood and affect. His speech is normal and behavior is normal. Judgment and thought content normal. Thought content is not paranoid and not delusional.  Cognition and memory are normal. He expresses no homicidal and no suicidal ideation. He expresses no suicidal plans and no homicidal plans.  Vitals reviewed.         Assessment & Plan:   Severe major depression without psychotic features ([place])  Other specified hypothyroidism  I am very happy that the patient seems to be doing better today.  We will continue his current dose of Prozac and Zyprexa (20 mg and 5 mg) at the present time.  I would like to allow the patient more time for these to take effect.  Reassess the patient in 1 month or sooner if symptoms develop.  I will also follow-up to ensure that he has been referred to a psychiatrist as he is yet to hear back from scheduling.  Recheck TSH in 4 to 6 weeks.  If still elevated at that time, we can potentially increase the dose of Cytomel to 75 mcg or switch to Synthroid.  Reassess at follow-up in 1 month.

## 2019-05-24 ENCOUNTER — Telehealth: Payer: Self-pay | Admitting: *Deleted

## 2019-05-24 NOTE — Telephone Encounter (Signed)
Received PA determination.  PA denied.   [facility] [employer]  received your recent request for coverage of SILDENAFIL CITRATE 100MG  TAB. Your request has been denied based on the terms of your pharmacy benefit plan. The reason for the denial is because: *Drug Not Covered/Plan Exclusion - Your request for coverage was denied because your prescription benefit plan does not cover the requested medication.

## 2019-05-24 NOTE — Telephone Encounter (Signed)
Received request from pharmacy for PA on Sildenafil.   PA submitted. 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 To check for an updated outcome later, reopen this PA request from your dashboard.  If [employer] has not responded to your request within 24 hours, contact [employer] at [phone]. If you think there may be a problem with your PA request, use our live chat feature at the bottom right.

## 2019-05-27 ENCOUNTER — Other Ambulatory Visit: Payer: Self-pay | Admitting: Family Medicine

## 2019-05-30 ENCOUNTER — Other Ambulatory Visit: Payer: Self-pay | Admitting: Family Medicine

## 2019-07-01 ENCOUNTER — Other Ambulatory Visit: Payer: Self-pay

## 2019-07-01 ENCOUNTER — Ambulatory Visit (INDEPENDENT_AMBULATORY_CARE_PROVIDER_SITE_OTHER): Payer: BC Managed Care – PPO | Admitting: Family Medicine

## 2019-07-01 ENCOUNTER — Encounter: Payer: Self-pay | Admitting: Family Medicine

## 2019-07-01 VITALS — BP 112/68 | HR 68 | Temp 98.1°F | Resp 16 | Ht 71.0 in | Wt 197.0 lb

## 2019-07-01 DIAGNOSIS — F322 Major depressive disorder, single episode, severe without psychotic features: Secondary | ICD-10-CM

## 2019-07-01 DIAGNOSIS — Z13220 Encounter for screening for lipoid disorders: Secondary | ICD-10-CM | POA: Diagnosis not present

## 2019-07-01 MED ORDER — ADDERALL XR 10 MG PO CP24: 10.0000 mg | ORAL_CAPSULE | Freq: Every morning | ORAL | 0 refills | Status: DC

## 2019-07-01 MED ORDER — SILDENAFIL CITRATE 100 MG PO TABS: 50.0000 mg | ORAL_TABLET | Freq: Every day | ORAL | 11 refills | Status: DC | PRN

## 2019-07-01 MED ORDER — ADDERALL XR 25 MG PO CP24: 25.0000 mg | ORAL_CAPSULE | ORAL | 0 refills | Status: DC

## 2019-07-01 NOTE — Progress Notes (Signed)
Subjective:    Patient ID: Terry [name], male    DOB: [birth year], 34 y.o.   MRN: [id number]  Shoulder Pain   Medication Refill   06/25/16 Patient is a a pleasant 34 year old Caucasian male here today to establish care. He recently took a job in [place] and he will be moving to [place] for the next week. Therefore he will likely not follow up here in the future. However I see both of his parents and he would like a primary care provider in the area in case the job in [place] does not work out. 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 In fact he was hospitalized in the past with suicidal ideation while in [place]. However over the last 2-3 years he's been doing remarkably better. He has been having telephone conferences with the psychiatrist in [place] who is helped him substantially. He is currently on high-dose trintellix 20 mg a day and Abilify 10 mg a day. This combination seems to work better for him than any other combination he is tried in the past. He has no bipolar tendencies. He is only taking the Abilify for severe depression. Past medical history is complicated however by attention deficit disorder. He takes Adderall XR 20 mg twice a day. He has been on this dose for many years. Even though he is graduated, he finds it unable to focus to complete regular task. He finds it easy to become distracted. He reports poor organization and poor listening skills without the medication. He is very concerned about starting his new job in [place] without the medication. At the present time he states that his depression is well controlled. He denies any suicidal ideation. He states he still has his good days and his bad days however he hasn't a severe exacerbation in over 3 years. He denies any trouble getting out of bed in the morning. He denies any problems with appetite or energy level. He does report insomnia for which he takes Ambien. He's  been taking Ambien for the last 3 or 4 months. We had a long discussion today about habituation and dependency. I recommended trying to use this medication sparingly.  At that time, my plan was: At the present time I'll make no changes in his dose of medication. Continue Adderall XR 20 mg by mouth every morning and every afternoon. Patient was given 60 tablets and refills for 2 months but postdated. I will continue Trintellix as well as his Abilify until he is able to establish a primary care provider in [place]. I would like him to come in fasting for a CBC, fasting lipid panel, and a CMP given the fact he is taking Abilify. I recommended he try to use Ambien sparingly to avoid habituation and dependency. Recheck in 6 months if not establish with primary care provider or sooner if symptoms arise  10/11/16 Patient states that the Adderall seems to wear off early in the afternoon. He is currently taking Adderall XR 40 mg in the morning. He is taking both 20 mg tablets first thing in morning. The medication works well until about 4:00 and then he feels a "crash" he is unable to focus the rest of the day. He denies any insomnia. He denies any anxiety. He denies any palpitations or tachycardia. From the standpoint of his depression, he is doing overall very well. He is taking Abilify in the morning which might be contributing some to the  crash later in the day. He denies any suicidal ideation, hallucinations, or delusion. He is a little sad recently as he and his girlfriend have ended their relationship but overall he seems to be doing well.  At that time, my plan was: I have recommended the patient begin Adderall XR 25 mg tablets but I want him to take them twice a day and try to spread it out. I like him to take first with breakfast and the second one around 2:00 in the afternoon. I believe that will give him more sustained benefit throughout the day without drastically increasing his dose. I also  recommended that he take the Abilify at night when trying to go to sleep so that hopefully will not contribute to any fatigue or crash later in the day. Recheck in 6 months or as needed.   02/20/17 Patient was admitted to the hospital in [place] in August due to depression. At that time they discontinued Ambien and replace it with Remeron 15 mg a day. Since that time he has done fairly well. However he states that he is never completely felt that the depression went into remission. Recently his girlfriend ended their relationship. As a result his depression has drastically worsened. He also ran out of his Abilify and Remeron more than a week ago. That coupled with the breakup of his girlfriend coupled with the holidays his left him and severe depression. He denies any suicidal ideation or suicidal plan. He denies any hallucinations or delusions. He is not a threat to himself or to anyone else. However he is extremely sad. He tentatively plan to go back to [place] today. However I do not believe this is a good idea. He would be alone in [place] and I would much rather him be here with his family who can left him and can supervise/monitor him. He is in agreement with this plan. He also requests STD testing. He was recently diagnosed with epididymitis after a sexual relationship of the person that he did not know very well. He has not been checked for gonorrhea or chlamydia or HIV and he is requesting that today.  AT that time, my plan was: Increase Remeron to 30 mg a day and recheck here in one week. Do not go back to [place]. The patient will stay here with his family or he can be monitored more closely. Resume history trintellix and his Abilify which he ran out of.  Screen the patient for HIV, gonorrhea, Chlamydia per his request. Reassess the patient in one week  02/27/17 Patient is doing much better today. The depression is much better now that he is back on his medication. He  also feels better now that he is made to the holidays. His parents have been watching him closely over the last week. He feels safe and supported. He certainly feels like he is coming out of the tunnel. From that standpoint I'm very happy today that he is doing better. He continues to complain of some pain in his left testicle. On exam, the left testicle is not swollen. There is no mass or nodule. He does have some pain and tenderness over the epididymis concerning for epididymitis. There is no lymphadenopathy. He also reports erectile dysfunction due to his medication.  At that time, my plan was: I will treat epididymitis with doxycycline 100 mg by mouth twice a day for 10 days. Obtain ultrasound if pain persists. Continue his current medications for his depression as he  is doing better. Patient denies any suicidal ideation and feels safe to return back home to resume work. I encouraged him to find a psychiatrist in [place] for follow-up. I will give the patient Viagra, 50-100 mg by mouth daily when necessary sexual activity. I believe his erectile dysfunction secondary to medication, particularly his Abilify  07/31/17 Patient has since moved back home from [place] and is living with his parents again in [place].  He never established with a psychiatrist in [place] as we had discussed.  Instead he was continuing to see a psychiatrist in the [facility] via telephone.  That psychiatrist started him on Latuda in addition to Abilify.  Patient states that he has had his ups and downs but overall his depression is much better than it was earlier this year.  I question the combination of Latuda and Abilify.  He is not certain whether his psychiatrist intended to keep him on this combination in addition to his Trintellix.  He continues to report erectile dysfunction and decreased libido.  He denies any gynecomastia.  He continues to do well on his current dose of Adderall.  He denies any  suicidal thoughts.  He denies any mania.  He denies any insomnia.  He denies any increase in goal-directed behavior.  He denies any racing thoughts, tangential thoughts.  He has normal speech.  He has normal behavior.  He denies any hallucinations or delusions.  AT that time, my plan was: I recommended discontinuation of Abilify given the fact the patient seems to be doing better on Latuda.  I explained to him that I do not use these medications in combination given the fact they are in the same pharmacologic class due to the risk of side effects and polypharmacy.  Therefore I will continue him on his current dose of Latuda in addition to Trintellix.  I will also make no changes in his dose of Adderall for ADD.  However I have recommended that he establish with a local psychiatrist.  As I explained to the patient, I believe his situation is more complex than what I feel comfortable managing and that he needs a psychiatric expert to help manage his medication.  I do not feel that his current method of intermittent follow-up via telephone is sufficient.  Therefore I provided him with the contact information for local psychiatrist, Dr. Betti Cruz and strongly encouraged him to call and make that appointment.  05/04/19 Patient has been working with a psychiatrist in another state battling his depression now for many years.  He states that he has been on virtually every medication that exist.  He states that he has 2 bags of medication at home and none of them have helped him.  He states that he has been on virtually every SSRI.  He is taken Jordan, Rexulti, Seroquel.  He does not recall Zyprexa.  He does remember being on Abilify as well.  He is also taken lithium.  None of these medications provided him any relief.  He even tried TMS therapy on 2 separate occasions with no benefit.  Most recently his psychiatrist referred him for ECT therapy.  He tried this on 2 separate occasions with no benefit.  He feels that he is at  his wits end.  He fears waking up every day due to the persistent, hopeless feeling of depression.  He sees no sign of improvement.  He has been unemployed for more than a year.  He lives at home with his  parents and spends a lot of time with his mother to avoid being lonely.  The depression keeps him from being able to find gainful employment.  He states that he does not have many friends.  He does not have a current relationship.  All of these reasons make him feel hopeless about the future.  He denies mania.  He denies racing thoughts.  He denies insomnia.  He denies any tangential thoughts.  He denies any hallucinations or delusions.  He denies any homicidal ideation.  He denies any plan for suicide although he admits that sometimes he wishes that it would all stop.  He denies any active plan of suicide or intention to harm himself.  He feels scared and somewhat hopeless that his psychiatrist has not been able to help him and he would like a referral to a psychiatrist in the area or at least some plan moving forward to help manage his depression.  At that time, my plan was: My heart goes out to this patient.  His depression seems severe although he is not actively suicidal.  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  I will also try to expedite referral to a psychiatrist.  I plan to see the patient back weekly until psychiatry sees the patient.  We contracted today for safety and I encouraged the patient that he should go to the hospital immediately if he begins to feel suicidal or has thoughts of wanting to hurt himself.  05/11/19  Thankfully, the patient seems to be doing much better this week.  He states that he feels better.  He realizes the medication does not take effect back quickly.  There have been some other social situations that have helped but overall he seems to be much healthier place today.  He feels better.  He is not  contemplating suicide and denies any suicidal plans or thoughts.  Obviously there is still a long way to go but there definitely has been improvement.  His TSH was mildly elevated at 4.65.  The patient was taking Cytomel 80 mcg a day.  I question if his previous psychiatrist was using this off label to help treat depression or to prevent cognitive impairment from ECT.  We had a long discussion today about possibly increasing the dose which will be difficult as he is on the highest tablet strength or potentially switching to Synthroid.  At that time, my plan was: I am very happy that the patient seems to be doing better today.  We will continue his current dose of Prozac and Zyprexa (20 mg and 5 mg) at the present time.  I would like to allow the patient more time for these to take effect.  Reassess the patient in 1 month or sooner if symptoms develop.  I will also follow-up to ensure that he has been referred to a psychiatrist as he is yet to hear back from scheduling.  Recheck TSH in 4 to 6 weeks.  If still elevated at that time, we can potentially increase the dose of Cytomel to 75 mcg or switch to Synthroid.  Reassess at follow-up in 1 month.  07/01/19   Great news, the patient continues to be doing well.  He denies any depression.  He states that he has been doing excellent ever since he started the Zyprexa.  His weight is stable.  At the present time he is not interested in increasing his  Cytomel.  He denies any manic symptoms.  He denies any insomnia.  He denies any suicidal ideation.  He is due for refill on his Adderall XR 35 mg a day that he takes for ADD.  Without medication he is unable to focus.  Furthermore medication also seems to help with his severe depression and helps with energy drive and motivation.  Past Medical History:  Diagnosis Date  . Acute appendicitis 02/08/2013  . ADHD (attention deficit hyperactivity disorder)   . Allergic rhinitis   . Appendicitis, acute 02/08/2013  .  Depression    since 8th grade, hospitalized 67 ([place])  . HSV infection    Past Surgical History:  Procedure Laterality Date  . APPENDECTOMY     2013  . LAPAROSCOPIC APPENDECTOMY N/A 02/08/2013   Procedure: APPENDECTOMY LAPAROSCOPIC;  Surgeon: Velora Heckler, MD;  Location: [facility];  Service: General;  Laterality: N/A;  . WISDOM TOOTH EXTRACTION     Current Outpatient Medications on File Prior to Visit  Medication Sig Dispense Refill  . ADDERALL XR 10 MG 24 hr capsule Take 10 mg by mouth every morning. = 30 mg    . ADDERALL XR 25 MG 24 hr capsule Take 25 mg by mouth every morning. = 30 mg    . FLUoxetine (PROZAC) 20 MG capsule TAKE 1 CAPSULE BY MOUTH EVERY DAY 90 capsule 2  . liothyronine (CYTOMEL) 50 MCG tablet Take 1 tablet (50 mcg total) by mouth daily. (Patient taking differently: Take 50 mcg by mouth daily. ) 30 tablet 0  . OLANZapine (ZYPREXA) 5 MG tablet TAKE 1 TABLET BY MOUTH EVERYDAY AT BEDTIME 90 tablet 1  . sildenafil (REVATIO) 20 MG tablet Take 60-100 mg by mouth daily as needed.    . traZODone (DESYREL) 100 MG tablet Take 100 mg by mouth at bedtime.     . valACYclovir (VALTREX) 1000 MG tablet Take 1,000 mg by mouth 2 (two) times daily as needed.     No current facility-administered medications on file prior to visit.   No Known Allergies Social History   Socioeconomic History  . Marital status: Single    Spouse name: Not on file  . Number of children: 0  . Years of education: Not on file  . Highest education level: Not on file  Occupational History  . Occupation: Archivist  Tobacco Use  . Smoking status: Former Smoker    Packs/day: 0.50    Years: 3.00    Pack years: 1.50    Types: Cigarettes    Quit date: 02/26/2008    Years since quitting: 11.3  . Smokeless tobacco: Never Used  Substance and Sexual Activity  . Alcohol use: Yes    Alcohol/week: 12.0 standard drinks    Types: 12 Cans of beer per week    Comment: week  . Drug use: No  . Sexual  activity: Not on file  Other Topics Concern  . Not on file  Social History Narrative  . Not on file   Social Determinants of Health   Financial Resource Strain:   . Difficulty of Paying Living Expenses:   Food Insecurity:   . Worried About Programme researcher, broadcasting/film/video in the Last Year:   . Barista in the Last Year:   Transportation Needs:   . Freight forwarder (Medical):   Marland Kitchen Lack of Transportation (Non-Medical):   Physical Activity:   . Days of Exercise per Week:   . Minutes of Exercise per Session:  Stress:   . Feeling of Stress :   Social Connections:   . Frequency of Communication with Friends and Family:   . Frequency of Social Gatherings with Friends and Family:   . Attends Religious Services:   . Active Member of Clubs or Organizations:   . Attends Banker Meetings:   Marland Kitchen Marital Status:   Intimate Partner Violence:   . Fear of Current or Ex-Partner:   . Emotionally Abused:   Marland Kitchen Physically Abused:   . Sexually Abused:    Family History  Problem Relation Age of Onset  . Allergic rhinitis Mother   . Cancer Maternal Grandfather        bone and prostate     Review of Systems  All other systems reviewed and are negative.      Objective:   Physical Exam  Constitutional: He appears well-developed and well-nourished. No distress.  Cardiovascular: Normal rate, regular rhythm, normal heart sounds and intact distal pulses. Exam reveals no gallop and no friction rub.  No murmur heard. Pulmonary/Chest: Effort normal and breath sounds normal. No respiratory distress. He has no wheezes. He has no rales. He exhibits no tenderness.  Skin: He is not diaphoretic.  Psychiatric: He has a normal mood and affect. His speech is normal and behavior is normal. Judgment and thought content normal. Thought content is not paranoid and not delusional. Cognition and memory are normal. He expresses no homicidal and no suicidal ideation. He expresses no suicidal plans and no  homicidal plans.  Vitals reviewed.         Assessment & Plan:   Thankfully his depression is currently in remission.  Continue Prozac and Zyprexa at their current doses.  I will recheck the patient in 6 months.  If stable at that time, I would likely try to wean the patient down on his antipsychotic medication.  We will like to continue his current dose of Cytomel.  I will refill his Adderall XR 35 mg a day.  I would like patient to come in fasting for CBC, CMP, and fasting lipid panel.

## 2019-08-02 ENCOUNTER — Encounter: Payer: Self-pay | Admitting: Family Medicine

## 2019-08-10 ENCOUNTER — Ambulatory Visit (INDEPENDENT_AMBULATORY_CARE_PROVIDER_SITE_OTHER): Payer: BC Managed Care – PPO | Admitting: Family Medicine

## 2019-08-10 ENCOUNTER — Other Ambulatory Visit: Payer: Self-pay

## 2019-08-10 VITALS — BP 140/42 | HR 69 | Temp 100.1°F | Ht 71.0 in | Wt 196.0 lb

## 2019-08-10 DIAGNOSIS — M79605 Pain in left leg: Secondary | ICD-10-CM

## 2019-08-10 DIAGNOSIS — M79604 Pain in right leg: Secondary | ICD-10-CM | POA: Diagnosis not present

## 2019-08-10 DIAGNOSIS — E038 Other specified hypothyroidism: Secondary | ICD-10-CM | POA: Diagnosis not present

## 2019-08-10 DIAGNOSIS — F322 Major depressive disorder, single episode, severe without psychotic features: Secondary | ICD-10-CM

## 2019-08-10 DIAGNOSIS — G629 Polyneuropathy, unspecified: Secondary | ICD-10-CM

## 2019-08-10 MED ORDER — LISDEXAMFETAMINE DIMESYLATE 40 MG PO CAPS: 40.0000 mg | ORAL_CAPSULE | ORAL | 0 refills | Status: DC

## 2019-08-10 NOTE — Progress Notes (Signed)
Subjective:    Patient ID: Terry [name], male    DOB: [birth year], 34 y.o.   MRN: [id number]  Shoulder Pain   Medication Refill   06/25/16 Patient is a a pleasant 34 year old Caucasian male here today to establish care. He recently took a job in [place] and he will be moving to [place] for the next week. Therefore he will likely not follow up here in the future. However I see both of his parents and he would like a primary care provider in the area in case the job in [place] does not work out. 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 In fact he was hospitalized in the past with suicidal ideation while in [place]. However over the last 2-3 years he's been doing remarkably better. He has been having telephone conferences with the psychiatrist in [place] who is helped him substantially. He is currently on high-dose trintellix 20 mg a day and Abilify 10 mg a day. This combination seems to work better for him than any other combination he is tried in the past. He has no bipolar tendencies. He is only taking the Abilify for severe depression. Past medical history is complicated however by attention deficit disorder. He takes Adderall XR 20 mg twice a day. He has been on this dose for many years. Even though he is graduated, he finds it unable to focus to complete regular task. He finds it easy to become distracted. He reports poor organization and poor listening skills without the medication. He is very concerned about starting his new job in [place] without the medication. At the present time he states that his depression is well controlled. He denies any suicidal ideation. He states he still has his good days and his bad days however he hasn't a severe exacerbation in over 3 years. He denies any trouble getting out of bed in the morning. He denies any problems with appetite or energy level. He does report insomnia for which he takes Ambien. He's  been taking Ambien for the last 3 or 4 months. We had a long discussion today about habituation and dependency. I recommended trying to use this medication sparingly.  At that time, my plan was: At the present time I'll make no changes in his dose of medication. Continue Adderall XR 20 mg by mouth every morning and every afternoon. Patient was given 60 tablets and refills for 2 months but postdated. I will continue Trintellix as well as his Abilify until he is able to establish a primary care provider in [place]. I would like him to come in fasting for a CBC, fasting lipid panel, and a CMP given the fact he is taking Abilify. I recommended he try to use Ambien sparingly to avoid habituation and dependency. Recheck in 6 months if not establish with primary care provider or sooner if symptoms arise  10/11/16 Patient states that the Adderall seems to wear off early in the afternoon. He is currently taking Adderall XR 40 mg in the morning. He is taking both 20 mg tablets first thing in morning. The medication works well until about 4:00 and then he feels a "crash" he is unable to focus the rest of the day. He denies any insomnia. He denies any anxiety. He denies any palpitations or tachycardia. From the standpoint of his depression, he is doing overall very well. He is taking Abilify in the morning which might be contributing some to the  crash later in the day. He denies any suicidal ideation, hallucinations, or delusion. He is a little sad recently as he and his girlfriend have ended their relationship but overall he seems to be doing well.  At that time, my plan was: I have recommended the patient begin Adderall XR 25 mg tablets but I want him to take them twice a day and try to spread it out. I like him to take first with breakfast and the second one around 2:00 in the afternoon. I believe that will give him more sustained benefit throughout the day without drastically increasing his dose. I also  recommended that he take the Abilify at night when trying to go to sleep so that hopefully will not contribute to any fatigue or crash later in the day. Recheck in 6 months or as needed.   02/20/17 Patient was admitted to the hospital in [place] in August due to depression. At that time they discontinued Ambien and replace it with Remeron 15 mg a day. Since that time he has done fairly well. However he states that he is never completely felt that the depression went into remission. Recently his girlfriend ended their relationship. As a result his depression has drastically worsened. He also ran out of his Abilify and Remeron more than a week ago. That coupled with the breakup of his girlfriend coupled with the holidays his left him and severe depression. He denies any suicidal ideation or suicidal plan. He denies any hallucinations or delusions. He is not a threat to himself or to anyone else. However he is extremely sad. He tentatively plan to go back to [place] today. However I do not believe this is a good idea. He would be alone in [place] and I would much rather him be here with his family who can left him and can supervise/monitor him. He is in agreement with this plan. He also requests STD testing. He was recently diagnosed with epididymitis after a sexual relationship of the person that he did not know very well. He has not been checked for gonorrhea or chlamydia or HIV and he is requesting that today.  AT that time, my plan was: Increase Remeron to 30 mg a day and recheck here in one week. Do not go back to [place]. The patient will stay here with his family or he can be monitored more closely. Resume history trintellix and his Abilify which he ran out of.  Screen the patient for HIV, gonorrhea, Chlamydia per his request. Reassess the patient in one week  02/27/17 Patient is doing much better today. The depression is much better now that he is back on his medication. He  also feels better now that he is made to the holidays. His parents have been watching him closely over the last week. He feels safe and supported. He certainly feels like he is coming out of the tunnel. From that standpoint I'm very happy today that he is doing better. He continues to complain of some pain in his left testicle. On exam, the left testicle is not swollen. There is no mass or nodule. He does have some pain and tenderness over the epididymis concerning for epididymitis. There is no lymphadenopathy. He also reports erectile dysfunction due to his medication.  At that time, my plan was: I will treat epididymitis with doxycycline 100 mg by mouth twice a day for 10 days. Obtain ultrasound if pain persists. Continue his current medications for his depression as he  is doing better. Patient denies any suicidal ideation and feels safe to return back home to resume work. I encouraged him to find a psychiatrist in [place] for follow-up. I will give the patient Viagra, 50-100 mg by mouth daily when necessary sexual activity. I believe his erectile dysfunction secondary to medication, particularly his Abilify  07/31/17 Patient has since moved back home from [place] and is living with his parents again in [place].  He never established with a psychiatrist in [place] as we had discussed.  Instead he was continuing to see a psychiatrist in the [facility] via telephone.  That psychiatrist started him on Latuda in addition to Abilify.  Patient states that he has had his ups and downs but overall his depression is much better than it was earlier this year.  I question the combination of Latuda and Abilify.  He is not certain whether his psychiatrist intended to keep him on this combination in addition to his Trintellix.  He continues to report erectile dysfunction and decreased libido.  He denies any gynecomastia.  He continues to do well on his current dose of Adderall.  He denies any  suicidal thoughts.  He denies any mania.  He denies any insomnia.  He denies any increase in goal-directed behavior.  He denies any racing thoughts, tangential thoughts.  He has normal speech.  He has normal behavior.  He denies any hallucinations or delusions.  AT that time, my plan was: I recommended discontinuation of Abilify given the fact the patient seems to be doing better on Latuda.  I explained to him that I do not use these medications in combination given the fact they are in the same pharmacologic class due to the risk of side effects and polypharmacy.  Therefore I will continue him on his current dose of Latuda in addition to Trintellix.  I will also make no changes in his dose of Adderall for ADD.  However I have recommended that he establish with a local psychiatrist.  As I explained to the patient, I believe his situation is more complex than what I feel comfortable managing and that he needs a psychiatric expert to help manage his medication.  I do not feel that his current method of intermittent follow-up via telephone is sufficient.  Therefore I provided him with the contact information for local psychiatrist, Dr. Betti Cruz and strongly encouraged him to call and make that appointment.  05/04/19 Patient has been working with a psychiatrist in another state battling his depression now for many years.  He states that he has been on virtually every medication that exist.  He states that he has 2 bags of medication at home and none of them have helped him.  He states that he has been on virtually every SSRI.  He is taken Jordan, Rexulti, Seroquel.  He does not recall Zyprexa.  He does remember being on Abilify as well.  He is also taken lithium.  None of these medications provided him any relief.  He even tried TMS therapy on 2 separate occasions with no benefit.  Most recently his psychiatrist referred him for ECT therapy.  He tried this on 2 separate occasions with no benefit.  He feels that he is at  his wits end.  He fears waking up every day due to the persistent, hopeless feeling of depression.  He sees no sign of improvement.  He has been unemployed for more than a year.  He lives at home with his  parents and spends a lot of time with his mother to avoid being lonely.  The depression keeps him from being able to find gainful employment.  He states that he does not have many friends.  He does not have a current relationship.  All of these reasons make him feel hopeless about the future.  He denies mania.  He denies racing thoughts.  He denies insomnia.  He denies any tangential thoughts.  He denies any hallucinations or delusions.  He denies any homicidal ideation.  He denies any plan for suicide although he admits that sometimes he wishes that it would all stop.  He denies any active plan of suicide or intention to harm himself.  He feels scared and somewhat hopeless that his psychiatrist has not been able to help him and he would like a referral to a psychiatrist in the area or at least some plan moving forward to help manage his depression.  At that time, my plan was: My heart goes out to this patient.  His depression seems severe although he is not actively suicidal.  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  I will also try to expedite referral to a psychiatrist.  I plan to see the patient back weekly until psychiatry sees the patient.  We contracted today for safety and I encouraged the patient that he should go to the hospital immediately if he begins to feel suicidal or has thoughts of wanting to hurt himself.  05/11/19  Thankfully, the patient seems to be doing much better this week.  He states that he feels better.  He realizes the medication does not take effect back quickly.  There have been some other social situations that have helped but overall he seems to be much healthier place today.  He feels better.  He is not  contemplating suicide and denies any suicidal plans or thoughts.  Obviously there is still a long way to go but there definitely has been improvement.  His TSH was mildly elevated at 4.65.  The patient was taking Cytomel 80 mcg a day.  I question if his previous psychiatrist was using this off label to help treat depression or to prevent cognitive impairment from ECT.  We had a long discussion today about possibly increasing the dose which will be difficult as he is on the highest tablet strength or potentially switching to Synthroid.  At that time, my plan was: I am very happy that the patient seems to be doing better today.  We will continue his current dose of Prozac and Zyprexa (20 mg and 5 mg) at the present time.  I would like to allow the patient more time for these to take effect.  Reassess the patient in 1 month or sooner if symptoms develop.  I will also follow-up to ensure that he has been referred to a psychiatrist as he is yet to hear back from scheduling.  Recheck TSH in 4 to 6 weeks.  If still elevated at that time, we can potentially increase the dose of Cytomel to 75 mcg or switch to Synthroid.  Reassess at follow-up in 1 month.  07/01/19   Great news, the patient continues to be doing well.  He denies any depression.  He states that he has been doing excellent ever since he started the Zyprexa.  His weight is stable.  At the present time he is not interested in increasing his  Cytomel.  He denies any manic symptoms.  He denies any insomnia.  He denies any suicidal ideation.  He is due for refill on his Adderall XR 35 mg a day that he takes for ADD.  Without medication he is unable to focus.  Furthermore medication also seems to help with his severe depression and helps with energy drive and motivation.  At that time, my plan was: Thankfully his depression is currently in remission.  Continue Prozac and Zyprexa at their current doses.  I will recheck the patient in 6 months.  If stable at that  time, I would likely try to wean the patient down on his antipsychotic medication.  We will like to continue his current dose of Cytomel.  I will refill his Adderall XR 35 mg a day.  I would like patient to come in fasting for CBC, CMP, and fasting lipid panel  08/10/19 Unfortunately, over the last 2 to 3 weeks, the patient states that his depression has relapsed.  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  He is sweating excessively.  He believes he may have had heat exhaustion causing him to have to rest for 2 or 3 days.  However this started him feeling fatigue leading him to feel like he was falling back into his depression.  Since that time self-doubt has risen.  He feels decreasing focus, decreasing energy, anhedonia, and sadness.  He is not as bad as he was before but he is very concerned that he is relapsing and the fear of relapse seems to exacerbate the depression.  His biggest issue now appears to be his lack of energy, his lack of drive, and his lack of focus.  He also complains of bilateral leg pain.  He states that he has had degenerative disc disease in his back and at one point required an epidural steroid injection.  At that time he was having left-sided sciatica.  However he is now having burning stinging pain in both legs.  The pain radiates from his anterior pelvis down the anterior thigh to just below the knee.  It is a sharp stinging burning pain.  There is no exacerbating or alleviating factors.  Past Medical History:  Diagnosis Date  . Acute appendicitis 02/08/2013  . ADHD (attention deficit hyperactivity disorder)   . Allergic rhinitis   . Appendicitis, acute 02/08/2013  . Depression    since 8th grade, hospitalized 63 ([place])  . HSV infection    Past Surgical History:  Procedure Laterality Date  . APPENDECTOMY     2013  . LAPAROSCOPIC APPENDECTOMY N/A 02/08/2013   Procedure: APPENDECTOMY LAPAROSCOPIC;  Surgeon: Velora Heckler, MD;  Location: [facility];  Service:  General;  Laterality: N/A;  . WISDOM TOOTH EXTRACTION     Current Outpatient Medications on File Prior to Visit  Medication Sig Dispense Refill  . ADDERALL XR 10 MG 24 hr capsule Take 1 capsule (10 mg total) by mouth every morning. = 30 mg 30 capsule 0  . ADDERALL XR 25 MG 24 hr capsule Take 1 capsule by mouth every morning. = 30 mg 30 capsule 0  . FLUoxetine (PROZAC) 20 MG capsule TAKE 1 CAPSULE BY MOUTH EVERY DAY 90 capsule 2  . liothyronine (CYTOMEL) 50 MCG tablet Take 1 tablet (50 mcg total) by mouth daily. (Patient taking differently: Take 50 mcg by mouth daily. ) 30 tablet 0  . OLANZapine (ZYPREXA) 5 MG tablet TAKE 1 TABLET BY MOUTH EVERYDAY AT BEDTIME 90  tablet 1  . sildenafil (REVATIO) 20 MG tablet Take 60-100 mg by mouth daily as needed.    . sildenafil (VIAGRA) 100 MG tablet Take 0.5-1 tablets (50-100 mg total) by mouth daily as needed for erectile dysfunction. 5 tablet 11  . traZODone (DESYREL) 100 MG tablet Take 100 mg by mouth at bedtime.     . valACYclovir (VALTREX) 1000 MG tablet Take 1,000 mg by mouth 2 (two) times daily as needed.     No current facility-administered medications on file prior to visit.   No Known Allergies Social History   Socioeconomic History  . Marital status: Single    Spouse name: Not on file  . Number of children: 0  . Years of education: Not on file  . Highest education level: Not on file  Occupational History  . Occupation: Electronics engineer  Tobacco Use  . Smoking status: Former Smoker    Packs/day: 0.50    Years: 3.00    Pack years: 1.50    Types: Cigarettes    Quit date: 02/26/2008    Years since quitting: 11.4  . Smokeless tobacco: Never Used  Vaping Use  . Vaping Use: Never used  Substance and Sexual Activity  . Alcohol use: Yes    Alcohol/week: 12.0 standard drinks    Types: 12 Cans of beer per week    Comment: week  . Drug use: No  . Sexual activity: Not on file  Other Topics Concern  . Not on file  Social History Narrative    . Not on file   Social Determinants of Health   Financial Resource Strain:   . Difficulty of Paying Living Expenses:   Food Insecurity:   . Worried About Charity fundraiser in the Last Year:   . Arboriculturist in the Last Year:   Transportation Needs:   . Film/video editor (Medical):   Marland Kitchen Lack of Transportation (Non-Medical):   Physical Activity:   . Days of Exercise per Week:   . Minutes of Exercise per Session:   Stress:   . Feeling of Stress :   Social Connections:   . Frequency of Communication with Friends and Family:   . Frequency of Social Gatherings with Friends and Family:   . Attends Religious Services:   . Active Member of Clubs or Organizations:   . Attends Archivist Meetings:   Marland Kitchen Marital Status:   Intimate Partner Violence:   . Fear of Current or Ex-Partner:   . Emotionally Abused:   Marland Kitchen Physically Abused:   . Sexually Abused:    Family History  Problem Relation Age of Onset  . Allergic rhinitis Mother   . Cancer Maternal Grandfather        bone and prostate     Review of Systems  All other systems reviewed and are negative.      Objective:   Physical Exam Vitals reviewed.  Constitutional:      General: He is not in acute distress.    Appearance: He is well-developed. He is not diaphoretic.  Cardiovascular:     Rate and Rhythm: Normal rate and regular rhythm.     Heart sounds: Normal heart sounds. No murmur heard.  No friction rub. No gallop.   Pulmonary:     Effort: Pulmonary effort is normal. No respiratory distress.     Breath sounds: Normal breath sounds. No wheezing or rales.  Chest:     Chest wall: No tenderness.  Musculoskeletal:  Right upper leg: No deformity, tenderness or bony tenderness.     Left upper leg: No deformity, tenderness or bony tenderness.       Legs:  Neurological:     Motor: No weakness.     Coordination: Coordination normal.     Gait: Gait normal.     Deep Tendon Reflexes: Reflexes normal.   Psychiatric:        Speech: Speech normal.        Behavior: Behavior normal.        Thought Content: Thought content normal. Thought content is not paranoid or delusional. Thought content does not include homicidal or suicidal ideation. Thought content does not include homicidal or suicidal plan.        Judgment: Judgment normal.           Assessment & Plan:   .Severe major depression without psychotic features (HCC) - Plan: TSH, COMPLETE METABOLIC PANEL WITH GFR, T4, free, T3, Free  Other specified hypothyroidism - Plan: TSH, COMPLETE METABOLIC PANEL WITH GFR, T4, free, T3, Free  Neuropathy - Plan: NCV with EMG(electromyography)  Leg pain, bilateral - Plan: NCV with EMG(electromyography)  Depression seems to have worsened and is primarily due to lack of energy, lack of drive, and lack of concentration.  Therefore I recommended that we discontinue Adderall and replaced with Vyvanse 40 mg a day.  I believe that due to its longer half-life and better efficacy it would better control the patient's energy and focus and concentration.  I believe he has been habituated to Adderall and therefore is not as effective for him.  I hope that with improved energy and concentration this will improve his self-confidence and help improve his depression as well.  I will also check his TSH, free T3, and free T4.  We may need to discontinue his Cytomel and replaced with levothyroxine in an effort to improve his energy level and focus and concentration.  The bilateral leg pain appears to be due to peripheral neuropathy.  However he has a very atypical pattern.  Therefore I recommended nerve conduction studies to evaluate further to determine if he has a lumbosacral radiculopathy or peripheral neuropathy.

## 2019-08-11 LAB — COMPLETE METABOLIC PANEL WITH GFR
AG Ratio: 2.2 (calc) (ref 1.0–2.5)
ALT: 42 U/L (ref 9–46)
AST: 19 U/L (ref 10–40)
Albumin: 4.6 g/dL (ref 3.6–5.1)
Alkaline phosphatase (APISO): 46 U/L (ref 36–130)
BUN: 24 mg/dL (ref 7–25)
CO2: 27 mmol/L (ref 20–32)
Calcium: 9.8 mg/dL (ref 8.6–10.3)
Chloride: 106 mmol/L (ref 98–110)
Creat: 0.96 mg/dL (ref 0.60–1.35)
GFR, Est African American: 120 mL/min/{1.73_m2} (ref >=60)
GFR, Est Non African American: 103 mL/min/{1.73_m2} (ref >=60)
Globulin: 2.1 g/dL (calc) (ref 1.9–3.7)
Glucose, Bld: 82 mg/dL (ref 65–99)
Potassium: 4.6 mmol/L (ref 3.5–5.3)
Sodium: 141 mmol/L (ref 135–146)
Total Bilirubin: 0.4 mg/dL (ref 0.2–1.2)
Total Protein: 6.7 g/dL (ref 6.1–8.1)

## 2019-08-11 LAB — TSH: TSH: 0.11 mIU/L — ABNORMAL LOW (ref 0.40–4.50)

## 2019-08-11 LAB — T3, FREE: T3, Free: 5.3 pg/mL — ABNORMAL HIGH (ref 2.3–4.2)

## 2019-08-11 LAB — T4, FREE: Free T4: 0.3 ng/dL — ABNORMAL LOW (ref 0.8–1.8)

## 2019-08-12 ENCOUNTER — Other Ambulatory Visit: Payer: Self-pay | Admitting: Family Medicine

## 2019-08-12 ENCOUNTER — Other Ambulatory Visit: Payer: Self-pay

## 2019-08-12 ENCOUNTER — Ambulatory Visit: Payer: BC Managed Care – PPO | Admitting: Family Medicine

## 2019-08-12 MED ORDER — LEVOTHYROXINE SODIUM 50 MCG PO TABS
50.0000 ug | ORAL_TABLET | Freq: Every day | ORAL | 1 refills | Status: DC
Start: 2019-08-12 — End: 2020-01-11

## 2019-08-19 ENCOUNTER — Encounter: Payer: Self-pay | Admitting: Family Medicine

## 2019-09-01 ENCOUNTER — Telehealth: Payer: Self-pay

## 2019-09-01 DIAGNOSIS — G629 Polyneuropathy, unspecified: Secondary | ICD-10-CM

## 2019-09-01 NOTE — Telephone Encounter (Signed)
-----   Message from Lysbeth Galas sent at 09/01/2019  8:52 AM EDT ----- Trula Ore,  Please add a referral to neurology for NCV WITH EMG (ELECTROMYOGRAPHY). The neurology departments like referrals instead of an order. It looks like Dr. Tanya Nones added an order on 08/10/2019 for this test.  Thank you, Carollee Herter

## 2019-09-08 ENCOUNTER — Encounter: Payer: Self-pay | Admitting: Family Medicine

## 2019-09-11 ENCOUNTER — Encounter: Payer: Self-pay | Admitting: Family Medicine

## 2019-09-13 MED ORDER — LISDEXAMFETAMINE DIMESYLATE 40 MG PO CAPS: 40.0000 mg | ORAL_CAPSULE | ORAL | 0 refills | Status: DC

## 2019-09-13 NOTE — Telephone Encounter (Signed)
Ok to refill??  Last office visit/ refill 08/10/2019. 

## 2019-10-14 ENCOUNTER — Other Ambulatory Visit: Payer: Self-pay

## 2019-10-14 MED ORDER — LISDEXAMFETAMINE DIMESYLATE 40 MG PO CAPS: 40.0000 mg | ORAL_CAPSULE | ORAL | 0 refills | Status: DC

## 2019-10-14 MED ORDER — VALACYCLOVIR HCL 1 G PO TABS: 1000.0000 mg | ORAL_TABLET | Freq: Two times a day (BID) | ORAL | 0 refills | Status: DC | PRN

## 2019-10-22 ENCOUNTER — Other Ambulatory Visit: Payer: Self-pay | Admitting: Family Medicine

## 2019-11-04 ENCOUNTER — Other Ambulatory Visit: Payer: Self-pay

## 2019-11-04 ENCOUNTER — Ambulatory Visit (INDEPENDENT_AMBULATORY_CARE_PROVIDER_SITE_OTHER): Payer: BC Managed Care – PPO | Admitting: Family Medicine

## 2019-11-04 VITALS — BP 108/64 | HR 75 | Temp 98.5°F | Ht 71.0 in | Wt 194.0 lb

## 2019-11-04 DIAGNOSIS — F322 Major depressive disorder, single episode, severe without psychotic features: Secondary | ICD-10-CM

## 2019-11-04 DIAGNOSIS — K529 Noninfective gastroenteritis and colitis, unspecified: Secondary | ICD-10-CM | POA: Diagnosis not present

## 2019-11-04 DIAGNOSIS — E038 Other specified hypothyroidism: Secondary | ICD-10-CM

## 2019-11-04 DIAGNOSIS — F988 Other specified behavioral and emotional disorders with onset usually occurring in childhood and adolescence: Secondary | ICD-10-CM

## 2019-11-04 LAB — TSH: TSH: 2.35 mIU/L (ref 0.40–4.50)

## 2019-11-04 MED ORDER — LISDEXAMFETAMINE DIMESYLATE 50 MG PO CAPS: 50.0000 mg | ORAL_CAPSULE | Freq: Every day | ORAL | 0 refills | Status: DC

## 2019-11-04 NOTE — Progress Notes (Signed)
Subjective:    Patient ID: Terry [name], male    DOB: [birth year], 34 y.o.   MRN: [id number]  Shoulder Pain   Medication Refill   06/25/16 Patient is a a pleasant 34 year old Caucasian male here today to establish care. He recently took a job in [place] and he will be moving to [place] for the next week. Therefore he will likely not follow up here in the future. However I see both of his parents and he would like a primary care provider in the area in case the job in [place] does not work out. 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 In fact he was hospitalized in the past with suicidal ideation while in [place]. However over the last 2-3 years he's been doing remarkably better. He has been having telephone conferences with the psychiatrist in [place] who is helped him substantially. He is currently on high-dose trintellix 20 mg a day and Abilify 10 mg a day. This combination seems to work better for him than any other combination he is tried in the past. He has no bipolar tendencies. He is only taking the Abilify for severe depression. Past medical history is complicated however by attention deficit disorder. He takes Adderall XR 20 mg twice a day. He has been on this dose for many years. Even though he is graduated, he finds it unable to focus to complete regular task. He finds it easy to become distracted. He reports poor organization and poor listening skills without the medication. He is very concerned about starting his new job in [place] without the medication. At the present time he states that his depression is well controlled. He denies any suicidal ideation. He states he still has his good days and his bad days however he hasn't a severe exacerbation in over 3 years. He denies any trouble getting out of bed in the morning. He denies any problems with appetite or energy level. He does report insomnia for which he takes Ambien. He's  been taking Ambien for the last 3 or 4 months. We had a long discussion today about habituation and dependency. I recommended trying to use this medication sparingly.  At that time, my plan was: At the present time I'll make no changes in his dose of medication. Continue Adderall XR 20 mg by mouth every morning and every afternoon. Patient was given 60 tablets and refills for 2 months but postdated. I will continue Trintellix as well as his Abilify until he is able to establish a primary care provider in [place]. I would like him to come in fasting for a CBC, fasting lipid panel, and a CMP given the fact he is taking Abilify. I recommended he try to use Ambien sparingly to avoid habituation and dependency. Recheck in 6 months if not establish with primary care provider or sooner if symptoms arise  10/11/16 Patient states that the Adderall seems to wear off early in the afternoon. He is currently taking Adderall XR 40 mg in the morning. He is taking both 20 mg tablets first thing in morning. The medication works well until about 4:00 and then he feels a "crash" he is unable to focus the rest of the day. He denies any insomnia. He denies any anxiety. He denies any palpitations or tachycardia. From the standpoint of his depression, he is doing overall very well. He is taking Abilify in the morning which might be contributing some to the  crash later in the day. He denies any suicidal ideation, hallucinations, or delusion. He is a little sad recently as he and his girlfriend have ended their relationship but overall he seems to be doing well.  At that time, my plan was: I have recommended the patient begin Adderall XR 25 mg tablets but I want him to take them twice a day and try to spread it out. I like him to take first with breakfast and the second one around 2:00 in the afternoon. I believe that will give him more sustained benefit throughout the day without drastically increasing his dose. I also  recommended that he take the Abilify at night when trying to go to sleep so that hopefully will not contribute to any fatigue or crash later in the day. Recheck in 6 months or as needed.   02/20/17 Patient was admitted to the hospital in [place] in August due to depression. At that time they discontinued Ambien and replace it with Remeron 15 mg a day. Since that time he has done fairly well. However he states that he is never completely felt that the depression went into remission. Recently his girlfriend ended their relationship. As a result his depression has drastically worsened. He also ran out of his Abilify and Remeron more than a week ago. That coupled with the breakup of his girlfriend coupled with the holidays his left him and severe depression. He denies any suicidal ideation or suicidal plan. He denies any hallucinations or delusions. He is not a threat to himself or to anyone else. However he is extremely sad. He tentatively plan to go back to [place] today. However I do not believe this is a good idea. He would be alone in [place] and I would much rather him be here with his family who can left him and can supervise/monitor him. He is in agreement with this plan. He also requests STD testing. He was recently diagnosed with epididymitis after a sexual relationship of the person that he did not know very well. He has not been checked for gonorrhea or chlamydia or HIV and he is requesting that today.  AT that time, my plan was: Increase Remeron to 30 mg a day and recheck here in one week. Do not go back to [place]. The patient will stay here with his family or he can be monitored more closely. Resume history trintellix and his Abilify which he ran out of.  Screen the patient for HIV, gonorrhea, Chlamydia per his request. Reassess the patient in one week  02/27/17 Patient is doing much better today. The depression is much better now that he is back on his medication. He  also feels better now that he is made to the holidays. His parents have been watching him closely over the last week. He feels safe and supported. He certainly feels like he is coming out of the tunnel. From that standpoint I'm very happy today that he is doing better. He continues to complain of some pain in his left testicle. On exam, the left testicle is not swollen. There is no mass or nodule. He does have some pain and tenderness over the epididymis concerning for epididymitis. There is no lymphadenopathy. He also reports erectile dysfunction due to his medication.  At that time, my plan was: I will treat epididymitis with doxycycline 100 mg by mouth twice a day for 10 days. Obtain ultrasound if pain persists. Continue his current medications for his depression as he  is doing better. Patient denies any suicidal ideation and feels safe to return back home to resume work. I encouraged him to find a psychiatrist in [place] for follow-up. I will give the patient Viagra, 50-100 mg by mouth daily when necessary sexual activity. I believe his erectile dysfunction secondary to medication, particularly his Abilify  07/31/17 Patient has since moved back home from [place] and is living with his parents again in [place].  He never established with a psychiatrist in [place] as we had discussed.  Instead he was continuing to see a psychiatrist in the [facility] via telephone.  That psychiatrist started him on Latuda in addition to Abilify.  Patient states that he has had his ups and downs but overall his depression is much better than it was earlier this year.  I question the combination of Latuda and Abilify.  He is not certain whether his psychiatrist intended to keep him on this combination in addition to his Trintellix.  He continues to report erectile dysfunction and decreased libido.  He denies any gynecomastia.  He continues to do well on his current dose of Adderall.  He denies any  suicidal thoughts.  He denies any mania.  He denies any insomnia.  He denies any increase in goal-directed behavior.  He denies any racing thoughts, tangential thoughts.  He has normal speech.  He has normal behavior.  He denies any hallucinations or delusions.  AT that time, my plan was: I recommended discontinuation of Abilify given the fact the patient seems to be doing better on Latuda.  I explained to him that I do not use these medications in combination given the fact they are in the same pharmacologic class due to the risk of side effects and polypharmacy.  Therefore I will continue him on his current dose of Latuda in addition to Trintellix.  I will also make no changes in his dose of Adderall for ADD.  However I have recommended that he establish with a local psychiatrist.  As I explained to the patient, I believe his situation is more complex than what I feel comfortable managing and that he needs a psychiatric expert to help manage his medication.  I do not feel that his current method of intermittent follow-up via telephone is sufficient.  Therefore I provided him with the contact information for local psychiatrist, Dr. Betti Cruz and strongly encouraged him to call and make that appointment.  05/04/19 Patient has been working with a psychiatrist in another state battling his depression now for many years.  He states that he has been on virtually every medication that exist.  He states that he has 2 bags of medication at home and none of them have helped him.  He states that he has been on virtually every SSRI.  He is taken Jordan, Rexulti, Seroquel.  He does not recall Zyprexa.  He does remember being on Abilify as well.  He is also taken lithium.  None of these medications provided him any relief.  He even tried TMS therapy on 2 separate occasions with no benefit.  Most recently his psychiatrist referred him for ECT therapy.  He tried this on 2 separate occasions with no benefit.  He feels that he is at  his wits end.  He fears waking up every day due to the persistent, hopeless feeling of depression.  He sees no sign of improvement.  He has been unemployed for more than a year.  He lives at home with his  parents and spends a lot of time with his mother to avoid being lonely.  The depression keeps him from being able to find gainful employment.  He states that he does not have many friends.  He does not have a current relationship.  All of these reasons make him feel hopeless about the future.  He denies mania.  He denies racing thoughts.  He denies insomnia.  He denies any tangential thoughts.  He denies any hallucinations or delusions.  He denies any homicidal ideation.  He denies any plan for suicide although he admits that sometimes he wishes that it would all stop.  He denies any active plan of suicide or intention to harm himself.  He feels scared and somewhat hopeless that his psychiatrist has not been able to help him and he would like a referral to a psychiatrist in the area or at least some plan moving forward to help manage his depression.  At that time, my plan was: My heart goes out to this patient.  His depression seems severe although he is not actively suicidal.  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  I will also try to expedite referral to a psychiatrist.  I plan to see the patient back weekly until psychiatry sees the patient.  We contracted today for safety and I encouraged the patient that he should go to the hospital immediately if he begins to feel suicidal or has thoughts of wanting to hurt himself.  05/11/19  Thankfully, the patient seems to be doing much better this week.  He states that he feels better.  He realizes the medication does not take effect back quickly.  There have been some other social situations that have helped but overall he seems to be much healthier place today.  He feels better.  He is not  contemplating suicide and denies any suicidal plans or thoughts.  Obviously there is still a long way to go but there definitely has been improvement.  His TSH was mildly elevated at 4.65.  The patient was taking Cytomel 80 mcg a day.  I question if his previous psychiatrist was using this off label to help treat depression or to prevent cognitive impairment from ECT.  We had a long discussion today about possibly increasing the dose which will be difficult as he is on the highest tablet strength or potentially switching to Synthroid.  At that time, my plan was: I am very happy that the patient seems to be doing better today.  We will continue his current dose of Prozac and Zyprexa (20 mg and 5 mg) at the present time.  I would like to allow the patient more time for these to take effect.  Reassess the patient in 1 month or sooner if symptoms develop.  I will also follow-up to ensure that he has been referred to a psychiatrist as he is yet to hear back from scheduling.  Recheck TSH in 4 to 6 weeks.  If still elevated at that time, we can potentially increase the dose of Cytomel to 75 mcg or switch to Synthroid.  Reassess at follow-up in 1 month.  07/01/19   Great news, the patient continues to be doing well.  He denies any depression.  He states that he has been doing excellent ever since he started the Zyprexa.  His weight is stable.  At the present time he is not interested in increasing his  Cytomel.  He denies any manic symptoms.  He denies any insomnia.  He denies any suicidal ideation.  He is due for refill on his Adderall XR 35 mg a day that he takes for ADD.  Without medication he is unable to focus.  Furthermore medication also seems to help with his severe depression and helps with energy drive and motivation.  At that time, my plan was: Thankfully his depression is currently in remission.  Continue Prozac and Zyprexa at their current doses.  I will recheck the patient in 6 months.  If stable at that  time, I would likely try to wean the patient down on his antipsychotic medication.  We will like to continue his current dose of Cytomel.  I will refill his Adderall XR 35 mg a day.  I would like patient to come in fasting for CBC, CMP, and fasting lipid panel  08/10/19 Unfortunately, over the last 2 to 3 weeks, the patient states that his depression has relapsed.  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  He is sweating excessively.  He believes he may have had heat exhaustion causing him to have to rest for 2 or 3 days.  However this started him feeling fatigue leading him to feel like he was falling back into his depression.  Since that time self-doubt has risen.  He feels decreasing focus, decreasing energy, anhedonia, and sadness.  He is not as bad as he was before but he is very concerned that he is relapsing and the fear of relapse seems to exacerbate the depression.  His biggest issue now appears to be his lack of energy, his lack of drive, and his lack of focus.  He also complains of bilateral leg pain.  He states that he has had degenerative disc disease in his back and at one point required an epidural steroid injection.  At that time he was having left-sided sciatica.  However he is now having burning stinging pain in both legs.  The pain radiates from his anterior pelvis down the anterior thigh to just below the knee.  It is a sharp stinging burning pain.  There is no exacerbating or alleviating factors.  At that time, my plan was: Depression seems to have worsened and is primarily due to lack of energy, lack of drive, and lack of concentration.  Therefore I recommended that we discontinue Adderall and replaced with Vyvanse 40 mg a day.  I believe that due to its longer half-life and better efficacy it would better control the patient's energy and focus and concentration.  I believe he has been habituated to Adderall and therefore is not as effective for him.  I hope that with improved  energy and concentration this will improve his self-confidence and help improve his depression as well.  I will also check his TSH, free T3, and free T4.  We may need to discontinue his Cytomel and replaced with levothyroxine in an effort to improve his energy level and focus and concentration.  The bilateral leg pain appears to be due to peripheral neuropathy.  However he has a very atypical pattern.  Therefore I recommended nerve conduction studies to evaluate further to determine if he has a lumbosacral radiculopathy or peripheral neuropathy.  11/04/19 Patient states that he feels like his depression is being well managed.  Overall he denies any anhedonia or suicidal thoughts.  At the present time he has some anxiety over his financial situation.  Overall though however he  is doing much better.  Unfortunately the Vyvanse 40 mg does not seem to help his focus last as long throughout the day.  He finds himself easily distracted earlier each day.  It is hard for him to finish his task at work.  He finds his mind racing at times.  He denies any tangential speech or flight of ideas.  He denies any delusions or hallucinations.  Instead he has a hard time maintaining focus consistent with his underlying diagnosis of ADD.  He is also due to recheck his thyroid test  Past Medical History:  Diagnosis Date  . Acute appendicitis 02/08/2013  . ADHD (attention deficit hyperactivity disorder)   . Allergic rhinitis   . Appendicitis, acute 02/08/2013  . Depression    since 8th grade, hospitalized 76 ([place])  . HSV infection    Past Surgical History:  Procedure Laterality Date  . APPENDECTOMY     2013  . LAPAROSCOPIC APPENDECTOMY N/A 02/08/2013   Procedure: APPENDECTOMY LAPAROSCOPIC;  Surgeon: Velora Heckler, MD;  Location: [facility];  Service: General;  Laterality: N/A;  . WISDOM TOOTH EXTRACTION     Current Outpatient Medications on File Prior to Visit  Medication Sig Dispense Refill  . FLUoxetine (PROZAC)  20 MG capsule TAKE 1 CAPSULE BY MOUTH EVERY DAY 90 capsule 2  . levothyroxine (SYNTHROID) 50 MCG tablet Take 1 tablet (50 mcg total) by mouth daily. 90 tablet 1  . lisdexamfetamine (VYVANSE) 40 MG capsule Take 1 capsule (40 mg total) by mouth every morning. 30 capsule 0  . OLANZapine (ZYPREXA) 5 MG tablet TAKE 1 TABLET BY MOUTH EVERYDAY AT BEDTIME 90 tablet 1  . sildenafil (REVATIO) 20 MG tablet Take 60-100 mg by mouth daily as needed.    . sildenafil (VIAGRA) 100 MG tablet Take 0.5-1 tablets (50-100 mg total) by mouth daily as needed for erectile dysfunction. 5 tablet 11  . traZODone (DESYREL) 100 MG tablet Take 100 mg by mouth at bedtime.     . valACYclovir (VALTREX) 1000 MG tablet TAKE 1 TABLET BY MOUTH TWICE A DAY AS NEEDED 30 tablet 0   No current facility-administered medications on file prior to visit.   No Known Allergies Social History   Socioeconomic History  . Marital status: Single    Spouse name: Not on file  . Number of children: 0  . Years of education: Not on file  . Highest education level: Not on file  Occupational History  . Occupation: Archivist  Tobacco Use  . Smoking status: Former Smoker    Packs/day: 0.50    Years: 3.00    Pack years: 1.50    Types: Cigarettes    Quit date: 02/26/2008    Years since quitting: 11.6  . Smokeless tobacco: Never Used  Vaping Use  . Vaping Use: Never used  Substance and Sexual Activity  . Alcohol use: Yes    Alcohol/week: 12.0 standard drinks    Types: 12 Cans of beer per week    Comment: week  . Drug use: No  . Sexual activity: Not on file  Other Topics Concern  . Not on file  Social History Narrative  . Not on file   Social Determinants of Health   Financial Resource Strain:   . Difficulty of Paying Living Expenses: Not on file  Food Insecurity:   . Worried About Programme researcher, broadcasting/film/video in the Last Year: Not on file  . Ran Out of Food in the Last Year: Not on file  Transportation Needs:   . Automotive engineerLack of  Transportation (Medical): Not on file  . Lack of Transportation (Non-Medical): Not on file  Physical Activity:   . Days of Exercise per Week: Not on file  . Minutes of Exercise per Session: Not on file  Stress:   . Feeling of Stress : Not on file  Social Connections:   . Frequency of Communication with Friends and Family: Not on file  . Frequency of Social Gatherings with Friends and Family: Not on file  . Attends Religious Services: Not on file  . Active Member of Clubs or Organizations: Not on file  . Attends BankerClub or Organization Meetings: Not on file  . Marital Status: Not on file  Intimate Partner Violence:   . Fear of Current or Ex-Partner: Not on file  . Emotionally Abused: Not on file  . Physically Abused: Not on file  . Sexually Abused: Not on file   Family History  Problem Relation Age of Onset  . Allergic rhinitis Mother   . Cancer Maternal Grandfather        bone and prostate     Review of Systems  All other systems reviewed and are negative.      Objective:   Physical Exam Vitals reviewed.  Constitutional:      General: He is not in acute distress.    Appearance: He is well-developed. He is not diaphoretic.  Cardiovascular:     Rate and Rhythm: Normal rate and regular rhythm.     Heart sounds: Normal heart sounds. No murmur heard.  No friction rub. No gallop.   Pulmonary:     Effort: Pulmonary effort is normal. No respiratory distress.     Breath sounds: Normal breath sounds. No wheezing or rales.  Chest:     Chest wall: No tenderness.  Neurological:     Motor: No weakness.     Coordination: Coordination normal.     Gait: Gait normal.     Deep Tendon Reflexes: Reflexes normal.  Psychiatric:        Speech: Speech normal.        Behavior: Behavior normal.        Thought Content: Thought content normal. Thought content is not paranoid or delusional. Thought content does not include homicidal or suicidal ideation. Thought content does not include  homicidal or suicidal plan.        Judgment: Judgment normal.           Assessment & Plan:  Other specified hypothyroidism - Plan: TSH  Severe major depression without psychotic features (HCC)  ADD (attention deficit disorder) without hyperactivity  Check a TSH to ensure adequate thyroid hormone level replacement.  Fortunately the Zyprexa seems to be working well managing his depression.  I will increase his Vyvanse to 50 mg a day.  Strongly recommended the patient receive his Covid vaccine

## 2019-11-08 ENCOUNTER — Other Ambulatory Visit: Payer: Self-pay | Admitting: Family Medicine

## 2019-11-22 ENCOUNTER — Encounter: Payer: Self-pay | Admitting: Family Medicine

## 2019-11-22 MED ORDER — TRAZODONE HCL 100 MG PO TABS: 100.0000 mg | ORAL_TABLET | Freq: Every day | ORAL | 3 refills | Status: DC

## 2019-12-01 ENCOUNTER — Telehealth: Payer: Self-pay | Admitting: Neurology

## 2019-12-01 ENCOUNTER — Encounter: Payer: Self-pay | Admitting: Neurology

## 2019-12-01 ENCOUNTER — Ambulatory Visit (INDEPENDENT_AMBULATORY_CARE_PROVIDER_SITE_OTHER): Payer: BC Managed Care – PPO | Admitting: Neurology

## 2019-12-01 ENCOUNTER — Other Ambulatory Visit: Payer: Self-pay

## 2019-12-01 VITALS — BP 126/69 | HR 61 | Ht 71.0 in | Wt 196.0 lb

## 2019-12-01 DIAGNOSIS — M79605 Pain in left leg: Secondary | ICD-10-CM | POA: Diagnosis not present

## 2019-12-01 DIAGNOSIS — R29898 Other symptoms and signs involving the musculoskeletal system: Secondary | ICD-10-CM | POA: Diagnosis not present

## 2019-12-01 DIAGNOSIS — M5417 Radiculopathy, lumbosacral region: Secondary | ICD-10-CM

## 2019-12-01 DIAGNOSIS — M5442 Lumbago with sciatica, left side: Secondary | ICD-10-CM

## 2019-12-01 DIAGNOSIS — G8929 Other chronic pain: Secondary | ICD-10-CM

## 2019-12-01 NOTE — Patient Instructions (Signed)
MRI lumbar spine and may proceed to CT Myelogram is necessary EMG/NCS only if above is non-revealing (please cancel) Physical Therapy   Myelogram  A myelogram is an imaging test. This test checks for problems in the spinal cord and the places where nerves attach to the spinal cord (nerve roots). A dye (contrast material) is put into your spine before the X-ray. This provides a clearer image for your doctor to see. You may need this test if you have a spinal cord problem that cannot be diagnosed with other imaging tests. You may also have this test to check your spine after surgery. Tell a doctor about:  Any allergies you have, especially to iodine.  All medicines you are taking, including vitamins, herbs, eye drops, creams, and over-the-counter medicines.  Any problems you or family members have had with anesthetic medicines or dye.  Any blood disorders you have.  Any surgeries you have had.  Any medical conditions you have or have had, including asthma.  Whether you are pregnant or may be pregnant. What are the risks? Generally, this is a safe procedure. However, problems may occur, including:  Infection.  Bleeding.  Allergic reaction to medicines or dyes.  Damage to your spinal cord or nerves.  Leaking of spinal fluid. This can cause a headache.  Damage to kidneys.  Seizures. This is rare. What happens before the procedure?  Follow instructions from your doctor about what you cannot eat or drink. You may be asked to drink more fluids.  Ask your doctor about changing or stopping your normal medicines. This is important if you take diabetes medicines or blood thinners.  Plan to have someone take you home from the hospital or clinic.  If you will be going home right after the procedure, plan to have someone with you for 24 hours. What happens during the procedure?  You will lie face down on a table.  Your doctor will find the best injection site on your spine.  This is most often in the lower back.  This area will be washed with soap.  You will be given a medicine to numb the area (local anesthetic).  Your doctor will place a long needle into the space around your spinal cord.  A sample of spinal fluid may be taken. This may be sent to the lab for testing.  The dye will be injected into the space around your spinal cord.  The exam table may be tilted. This helps the dye flow up or down your spine.  The X-ray will take images of your spinal cord.  A bandage (dressing) may be placed over the area where the dye was injected. The procedure may vary among doctors and hospitals. What can I expect after the procedure?  You may be monitored until you leave the hospital or clinic. This includes checking your blood pressure, heart rate, breathing rate, and blood oxygen level.  You may feel sore at the injection site. You may have a mild headache.  You will be told to lie flat with your head raised (elevated). This lowers the risk of a headache.  It is up to you to get the results of your procedure. Ask your doctor, or the department that is doing the procedure, when your results will be ready. Follow these instructions at home:   Rest as told by your doctor. Lie flat with your head slightly elevated.  Do not bend, lift, or do hard work for 24-48 hours, or as told by your  doctor.  Take over-the-counter and prescription medicines only as told by your doctor.  Take care of your bandage as told by your doctor.  Drink enough fluid to keep your pee (urine) pale yellow.  Bathe or shower as told by your doctor. Contact a doctor if:  You have a fever.  You have a headache that lasts longer than 24 hours.  You feel sick to your stomach (nauseous).  You vomit.  Your neck is stiff.  Your legs feel numb.  You cannot pee.  You cannot poop (no bowel movement).  You have a rash.  You are itchy or sneezing. Get help right away if:  You  have new symptoms or your symptoms get worse.  You have a seizure.  You have trouble breathing. Summary  A myelogram is an imaging test that checks for problems in the spinal cord and the places where nerves attach to the spinal cord (nerve roots).  Before the procedure, follow instructions from your doctor. You will be told what not to eat or drink, or what medicines to change or stop.  After the procedure, you will be told to lie flat with your head raised (elevated). This will lower your risk of a headache.  Do not bend, lift, or do any hard work for 24-48 hours, or as told by your doctor.  Contact a doctor if you have a stiff neck or numb legs. Get help right away if your symptoms get worse, or you have a seizure or trouble breathing. This information is not intended to replace advice given to you by your health care provider. Make sure you discuss any questions you have with your health care provider. Document Revised: 04/22/2018 Document Reviewed: 04/23/2018 Elsevier Patient Education  2020 [employer]   Radicular Pain Radicular pain is a type of pain that spreads from your back or neck along a spinal nerve. Spinal nerves are nerves that leave the spinal cord and go to the muscles. Radicular pain is sometimes called radiculopathy, radiculitis, or a pinched nerve. When you have this type of pain, you may also have weakness, numbness, or tingling in the area of your body that is supplied by the nerve. The pain may feel sharp and burning. Depending on which spinal nerve is affected, the pain may occur in the:  Neck area (cervical radicular pain). You may also feel pain, numbness, weakness, or tingling in the arms.  Mid-spine area (thoracic radicular pain). You would feel this pain in the back and chest. This type is rare.  Lower back area (lumbar radicular pain). You would feel this pain as low back pain. You may feel pain, numbness, weakness, or tingling in the buttocks or legs.  Sciatica is a type of lumbar radicular pain that shoots down the back of the leg. Radicular pain occurs when one of the spinal nerves becomes irritated or squeezed (compressed). It is often caused by something pushing on a spinal nerve, such as one of the bones of the spine (vertebrae) or one of the round cushions between vertebrae (intervertebral disks). This can result from:  An injury.  Wear and tear or aging of a disk.  The growth of a bone spur that pushes on the nerve. Radicular pain often goes away when you follow instructions from your health care provider for relieving pain at home. Follow these instructions at home: Managing pain      If directed, put ice on the affected area: ? Put ice in a plastic bag. ?  Place a towel between your skin and the bag. ? Leave the ice on for 20 minutes, 2-3 times a day.  If directed, apply heat to the affected area as often as told by your health care provider. Use the heat source that your health care provider recommends, such as a moist heat pack or a heating pad. ? Place a towel between your skin and the heat source. ? Leave the heat on for 20-30 minutes. ? Remove the heat if your skin turns bright red. This is especially important if you are unable to feel pain, heat, or cold. You may have a greater risk of getting burned. Activity   Do not sit or rest in bed for long periods of time.  Try to stay as active as possible. Ask your health care provider what type of exercise or activity is best for you.  Avoid activities that make your pain worse, such as bending and lifting.  Do not lift anything that is heavier than 10 lb (4.5 kg), or the limit that you are told, until your health care provider says that it is safe.  Practice using proper technique when lifting items. Proper lifting technique involves bending your knees and rising up.  Do strength and range-of-motion exercises only as told by your health care provider or physical  therapist. General instructions  Take over-the-counter and prescription medicines only as told by your health care provider.  Pay attention to any changes in your symptoms.  Keep all follow-up visits as told by your health care provider. This is important. ? Your health care provider may send you to a physical therapist to help with this pain. Contact a health care provider if:  Your pain and other symptoms get worse.  Your pain medicine is not helping.  Your pain has not improved after a few weeks of home care.  You have a fever. Get help right away if:  You have severe pain, weakness, or numbness.  You have difficulty with bladder or bowel control. Summary  Radicular pain is a type of pain that spreads from your back or neck along a spinal nerve.  When you have radicular pain, you may also have weakness, numbness, or tingling in the area of your body that is supplied by the nerve.  The pain may feel sharp or burning.  Radicular pain may be treated with ice, heat, medicines, or physical therapy. This information is not intended to replace advice given to you by your health care provider. Make sure you discuss any questions you have with your health care provider. Document Revised: 08/26/2017 Document Reviewed: 08/26/2017 Elsevier Patient Education  2020 [employer].

## 2019-12-01 NOTE — Progress Notes (Signed)
[facility]    Provider:  Dr Lucia Gaskins Requesting Provider: [name], MD Primary Care Provider:  [name], MD  CC:  Leg pain  HPI:  Terry [name] is a 34 y.o. male here as requested by [name], MD for Leg pain.  Past medical history depression, ADHD, bipolar, anxiety.  Patient is here and he reports back and leg pain, large section of his left lateral thigh is numb and its gotten worse, has been going on for about 10 years but he did not do anything about it, he bent down and then sneezed and felt a pop since then he has had numbness, he has back pain, pressure has to pop his back all day. He felt a pop in his lower back and all of a sudden the left side of the thigh has numb. Continuously numb, he feels tingling and burning and occasionally electricity. Standing still for a long time makes it worse, standing on hard surfaces, no weakness, never been obese, no tight clothing, no numbness or tingling in is feet, he had an MRI of his back when it happened and they said they couldn;t see or do anything and he had a shot of cortizone and he did not like that. It didn't help. He has put up with it over the last 10 years and getting worse. On top of that his back in general has been bothering him more. 5-6 in pain and sometimes it radiates below the knee to the top of the foot. No other issues, no upper extremity problems, no changes in bowel or bladder. No other focal neurologic deficits, associated symptoms, inciting events or modifiable factors.  Reviewed notes, labs and imaging from outside physicians, which showed:  TSH normal, CMP normal   I reviewed Dr. Felisa Bonier notes. States patient with neurolopathy vs radiculopathy. Suggested emg/ncs, ongoing 10 years, emg/ncs may be able to differentiate between the two.   Review of Systems: Patient complains of symptoms per HPI as well as the following symptoms joint pain . Pertinent negatives and positives per  HPI. All others negative.   Social History   Socioeconomic History   Marital status: Single    Spouse name: Not on file   Number of children: 0   Years of education: Not on file   Highest education level: Bachelor's degree (e.g., BA, AB, BS)  Occupational History   Occupation: works in garden department    Comment: [employer]   Tobacco Use   Smoking status: Former Smoker    Packs/day: 0.50    Years: 3.00    Pack years: 1.50    Types: Cigarettes    Quit date: 2009    Years since quitting: 12.7   Smokeless tobacco: Never Used  Building services engineer Use: Never used  Substance and Sexual Activity   Alcohol use: Yes    Alcohol/week: 12.0 standard drinks    Types: 12 Cans of beer per week    Comment: week   Drug use: Yes    Frequency: 7.0 times per week    Types: Marijuana   Sexual activity: Not on file  Other Topics Concern   Not on file  Social History Narrative   Lives at home alone   Right handed   Caffeine: 1 cup each morning    Social Determinants of Health   Financial Resource Strain:    Difficulty of Paying Living Expenses: Not on file  Food Insecurity:    Worried  About Running Out of Food in the Last Year: Not on file   Ran Out of Food in the Last Year: Not on file  Transportation Needs:    Lack of Transportation (Medical): Not on file   Lack of Transportation (Non-Medical): Not on file  Physical Activity:    Days of Exercise per Week: Not on file   Minutes of Exercise per Session: Not on file  Stress:    Feeling of Stress : Not on file  Social Connections:    Frequency of Communication with Friends and Family: Not on file   Frequency of Social Gatherings with Friends and Family: Not on file   Attends Religious Services: Not on file   Active Member of Clubs or Organizations: Not on file   Attends Banker Meetings: Not on file   Marital Status: Not on file  Intimate Partner Violence:    Fear of Current or Ex-Partner:  Not on file   Emotionally Abused: Not on file   Physically Abused: Not on file   Sexually Abused: Not on file    Family History  Problem Relation Age of Onset   Allergic rhinitis Mother    Cancer Maternal Grandfather        bone and prostate    Past Medical History:  Diagnosis Date   Acute appendicitis 02/08/2013   ADHD (attention deficit hyperactivity disorder)    Allergic rhinitis    Appendicitis, acute 02/08/2013   Depression    since 8th grade, hospitalized 2015 ([place])   HSV infection     Patient Active Problem List   Diagnosis Date Noted   Chronic left-sided low back pain with left-sided sciatica 12/01/2019   Genital herpes simplex 09/24/2018   Overweight (BMI 25.0-29.9) 09/24/2018   Bipolar affective disorder, current episode depressed (HCC) 09/24/2018   Generalized anxiety disorder 09/24/2018   Social anxiety disorder 09/24/2018   Insomnia 09/24/2018   Recurrent major depression resistant to treatment (HCC) 09/24/2018   ADD (attention deficit disorder) without hyperactivity 09/24/2018   Elevated liver enzymes 09/24/2018   Drug-induced erectile dysfunction 09/24/2018   ATTENTION DEFICIT HYPERACTIVITY DISORDER 03/04/2007   Seasonal and perennial allergic rhinitis 03/04/2007    Past Surgical History:  Procedure Laterality Date   APPENDECTOMY     2013   LAPAROSCOPIC APPENDECTOMY N/A 02/08/2013   Procedure: APPENDECTOMY LAPAROSCOPIC;  Surgeon: Velora Heckler, MD;  Location: [facility];  Service: General;  Laterality: N/A;   WISDOM TOOTH EXTRACTION      Current Outpatient Medications  Medication Sig Dispense Refill   FLUoxetine (PROZAC) 20 MG capsule TAKE 1 CAPSULE BY MOUTH EVERY DAY 90 capsule 2   levothyroxine (SYNTHROID) 50 MCG tablet Take 1 tablet (50 mcg total) by mouth daily. 90 tablet 1   lisdexamfetamine (VYVANSE) 50 MG capsule Take 1 capsule (50 mg total) by mouth daily. 30 capsule 0   OLANZapine (ZYPREXA) 5 MG tablet TAKE 1  TABLET BY MOUTH EVERYDAY AT BEDTIME 90 tablet 1   sildenafil (VIAGRA) 100 MG tablet Take 0.5-1 tablets (50-100 mg total) by mouth daily as needed for erectile dysfunction. 5 tablet 11   traZODone (DESYREL) 100 MG tablet Take 1 tablet (100 mg total) by mouth at bedtime. 30 tablet 3   valACYclovir (VALTREX) 1000 MG tablet TAKE 1 TABLET BY MOUTH TWICE A DAY AS NEEDED 30 tablet 0   No current facility-administered medications for this visit.    Allergies as of 12/01/2019   (No Known Allergies)    Vitals: BP  126/69 (BP Location: Right Arm, Patient Position: Sitting)    Pulse 61    Ht 5\' 11"  (1.803 m)    Wt 196 lb (88.9 kg)    BMI 27.34 kg/m  Last Weight:  Wt Readings from Last 1 Encounters:  12/01/19 196 lb (88.9 kg)   Last Height:   Ht Readings from Last 1 Encounters:  12/01/19 5\' 11"  (1.803 m)     Physical exam: Exam: Gen: NAD, conversant, well nourised, well groomed                     CV: RRR, no MRG. No Carotid Bruits. No peripheral edema, warm, nontender Eyes: Conjunctivae clear without exudates or hemorrhage  Neuro: Detailed Neurologic Exam  Speech:    Speech is normal; fluent and spontaneous with normal comprehension.  Cognition:    The patient is oriented to person, place, and time;     recent and remote memory intact;     language fluent;     normal attention, concentration,     fund of knowledge Cranial Nerves:    The pupils are equal, round, and reactive to light. The fundi are flat. Visual fields are full to finger confrontation. Extraocular movements are intact. Trigeminal sensation is intact and the muscles of mastication are normal. The face is symmetric. The palate elevates in the midline. Hearing intact. Voice is normal. Shoulder shrug is normal. The tongue has normal motion without fasciculations.   Coordination:    No dysmetria or ataxia  Gait:    Heel-toe and tandem gait are normal.   Motor Observation:    No asymmetry, no atrophy, and no  involuntary movements noted. Tone:    Normal muscle tone.    Posture:    Posture is normal. normal erect    Strength: left leg flexion 4/5. Otherwise strength is V/V in the upper and lower limbs.      Sensation: intact to LT     Reflex Exam:  DTR's:    Deep tendon reflexes in the upper and lower extremities are normal bilaterally.   Toes:    The toes are downgoing bilaterally.   Clonus:    Clonus is absent.    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 Will also send to PT. Consistent with L5 radic - he heard a disk pop, radiation down the side of the leg to the top of the toes, left leg flexion weakness.   MRI lumbar spine as is consistent with L5 radic (as above) - down the side of his left thigh to below the knee and top of foot.If this is negative may consider CT Myelogram. I will order an emg/ncs but cancel it if the above studies give 01/31/20 our answers. Another very unlikely possibility is meralgia paresthetica but that would not be consistent with the associated back pain and leg weakness and radiation to the toes.    Orders Placed This Encounter  Procedures   MR LUMBAR SPINE WO CONTRAST   Ambulatory referral to Physical Therapy   NCV with EMG(electromyography)     Cc: , MD,  [place], MD  [name], MD  [facility] [address] [place], [address] [place]  Phone [phone] Fax [phone]

## 2019-12-01 NOTE — Telephone Encounter (Signed)
[employer] Auth: [id number] (exp. 12/01/19 to 05/28/20) order sent to GI. They will reach out to the patient to schedule.

## 2019-12-02 ENCOUNTER — Other Ambulatory Visit: Payer: Self-pay | Admitting: Family Medicine

## 2019-12-09 ENCOUNTER — Encounter: Payer: Self-pay | Admitting: Family Medicine

## 2019-12-09 MED ORDER — LISDEXAMFETAMINE DIMESYLATE 50 MG PO CAPS
50.0000 mg | ORAL_CAPSULE | Freq: Every day | ORAL | 0 refills | Status: DC
Start: 2019-12-09 — End: 2020-01-12

## 2019-12-09 NOTE — Telephone Encounter (Signed)
Ok to refill??  Last office visit/ refill 11/04/2019.

## 2019-12-10 ENCOUNTER — Ambulatory Visit
Admission: RE | Admit: 2019-12-10 | Discharge: 2019-12-10 | Disposition: A | Discharge status: Home / Self Care | Payer: BC Managed Care – PPO | Source: Ambulatory Visit | Attending: Neurology | Admitting: Neurology

## 2019-12-10 DIAGNOSIS — M5417 Radiculopathy, lumbosacral region: Secondary | ICD-10-CM

## 2019-12-10 DIAGNOSIS — M79605 Pain in left leg: Secondary | ICD-10-CM | POA: Diagnosis not present

## 2019-12-10 DIAGNOSIS — G8929 Other chronic pain: Secondary | ICD-10-CM

## 2019-12-10 DIAGNOSIS — R29898 Other symptoms and signs involving the musculoskeletal system: Secondary | ICD-10-CM

## 2019-12-13 ENCOUNTER — Telehealth: Payer: Self-pay | Admitting: *Deleted

## 2019-12-13 DIAGNOSIS — M48061 Spinal stenosis, lumbar region without neurogenic claudication: Secondary | ICD-10-CM

## 2019-12-13 NOTE — Telephone Encounter (Signed)
I called the pt and LVM (ok per DPR) advising patient I was calling to discuss MRI L-spine results. They have been sent to mychart. I asked for the patient to call us back or send a message so we can discuss the next steps. Left office number in message.

## 2019-12-13 NOTE — Telephone Encounter (Signed)
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 I'd like to send him to neurosurgery to see what they suggest unless he already has a Careers adviser or othopaedist he has  seen in the past then we can send him  there. I think we can cancel the EMG/NCS and see what the surgeon says. Please call patient  thanks

## 2019-12-13 NOTE — Telephone Encounter (Signed)
Pt returned my call and we discussed the results of the MRI lumbar spine. He is willing to proceed with the referral to neurosurgery to discuss treatment options. He does not have an Warden/ranger he sees. He is curious if the EMG/NCS would still provide information and ensure nothing else is going on since he is going to be discuss possible injections or surgery. I let him know I would d/w Dr Lucia Gaskins. He verbalized appreciation.

## 2019-12-14 NOTE — Telephone Encounter (Signed)
I don;t think the EMG/NCS would give [place] any additional information I would follow up with neurosurgery first. Can cel emg/ncs for now.

## 2019-12-15 NOTE — Addendum Note (Signed)
Addended by: Bertram Savin on: 12/15/2019 08:10 AM   Modules accepted: Orders

## 2019-12-15 NOTE — Telephone Encounter (Signed)
Spoke with Dr Lucia Gaskins and placed a vo for amb ref to neurosurgery for moderate lumbar spina stenosis.

## 2019-12-21 ENCOUNTER — Other Ambulatory Visit: Payer: Self-pay | Admitting: Family Medicine

## 2019-12-28 ENCOUNTER — Ambulatory Visit: Payer: BC Managed Care – PPO | Attending: Neurology

## 2019-12-28 ENCOUNTER — Other Ambulatory Visit: Payer: Self-pay

## 2019-12-28 DIAGNOSIS — M6281 Muscle weakness (generalized): Secondary | ICD-10-CM | POA: Diagnosis not present

## 2019-12-28 DIAGNOSIS — R262 Difficulty in walking, not elsewhere classified: Secondary | ICD-10-CM | POA: Diagnosis not present

## 2019-12-28 DIAGNOSIS — M5442 Lumbago with sciatica, left side: Secondary | ICD-10-CM | POA: Insufficient documentation

## 2019-12-28 DIAGNOSIS — G8929 Other chronic pain: Secondary | ICD-10-CM | POA: Diagnosis not present

## 2019-12-28 DIAGNOSIS — M79605 Pain in left leg: Secondary | ICD-10-CM | POA: Diagnosis not present

## 2019-12-28 NOTE — Therapy (Signed)
[facility] [address] [place], [place], [address] Phone: [phone]   Fax:  [phone]  Physical Therapy Evaluation  Patient Details  Name: Terry [name] MRN: [id number] Date of Birth: 1985-08-21 Referring Provider (PT): Anson Fret, MD    Encounter Date: 12/28/2019   PT End of Session - 12/28/19 2018    Visit Number 1    Number of Visits 7    Date for PT Re-Evaluation 02/12/20    Authorization Type Blue Cross Blue Shield - FOTO at visit 6 and visit 10    PT Start Time 1745    PT Stop Time 1825    PT Time Calculation (min) 40 min    Activity Tolerance Patient tolerated treatment well    Behavior During Therapy [facility] for tasks assessed/performed           Past Medical History:  Diagnosis Date  . Acute appendicitis 02/08/2013  . ADHD (attention deficit hyperactivity disorder)   . Allergic rhinitis   . Appendicitis, acute 02/08/2013  . Depression    since 8th grade, hospitalized 19 ([place])  . HSV infection     Past Surgical History:  Procedure Laterality Date  . APPENDECTOMY     2013  . LAPAROSCOPIC APPENDECTOMY N/A 02/08/2013   Procedure: APPENDECTOMY LAPAROSCOPIC;  Surgeon: Velora Heckler, MD;  Location: [facility];  Service: General;  Laterality: N/A;  . WISDOM TOOTH EXTRACTION      There were no vitals filed for this visit.    Subjective Assessment - 12/28/19 1747    Subjective "When I was 34, I bent down and sneezed and felt a pop in my back. Since then, I have had numbness on the side of my left leg. They mentioned surgery after a while but didn't find anything at first. I didn't want surgery or anything then, and now it has gotten a lot more irritating. Sometimes I have tingling down to the side of my foot when it gets really bad."    Pertinent History depression    Limitations Standing    How long can you sit comfortably? "it just depends if I get into certain positions that make it worse, but it's  random"    How long can you stand comfortably? difficult to predict but prolonged standing (like at work) aggravates symptoms    How long can you walk comfortably? "walking usually helps loosen my back up when it's stiff in the morning"    Patient Stated Goals being able to stand for a longer time without pain and do work activities without pain worsening    Currently in Pain? Yes    Pain Score 3     Pain Location Back    Pain Orientation Left;Lower    Pain Descriptors / Indicators Aching;Burning    Pain Type Chronic pain    Pain Radiating Towards L leg occasionally and sometimes down to L foot (numbness)    Pain Onset More than a month ago    Pain Frequency Intermittent    Aggravating Factors  standing for prolonged time period, especially on hard floor; going up stairs    Pain Relieving Factors initially rest then slowly begin motions/stretches to "work it out"    Effect of Pain on Daily Activities "sometimes it just slows me down because I have to be cautious"              [facility] PT Assessment - 12/28/19 0001      Assessment  Medical Diagnosis Left leg weakness R29.898, Lumbosacral radiculopathy at L5 M54.17, Left leg pain M79.605, Chronic left-sided low back pain with left-sided sciatica M54.42, G89.29    Referring Provider (PT) Anson Fret, MD     Onset Date/Surgical Date --   Onset 10 years ago; began worsening over the past 6-8 months   Hand Dominance Right    Next MD Visit Nothing scheduled yet    Prior Therapy No      Precautions   Precautions None      Restrictions   Weight Bearing Restrictions No      Balance Screen   Has the patient fallen in the past 6 months No   fall off electric scooter within last year twice   Has the patient had a decrease in activity level because of a fear of falling?  No    Is the patient reluctant to leave their home because of a fear of falling?  No      Home Nurse, mental health Private residence    Living  Arrangements Alone    Available Help at Discharge Family;Friend(s)    Type of Home House    Home Access Stairs to enter    Entrance Stairs-Number of Steps 3    Entrance Stairs-Rails None    Home Layout One level    Home Equipment None      Prior Function   Level of Independence Independent    Vocation Full time employment    Counsellor - lifts plants, moves merchandise around     Leisure [place], be outside, be on the water, stay active as pain allows      Cognition   Overall Cognitive Status Within Functional Limits for tasks assessed      Observation/Other Assessments   Observations Slouched posture in sitting    Focus on Therapeutic Outcomes (FOTO)  26% limited; predicted 22% limited      ROM / Strength   AROM / PROM / Strength AROM;PROM;Strength      AROM   AROM Assessment Site Lumbar    Lumbar Flexion just to top of toes with tennis shoes on    Lumbar Extension WFL and no pain    Lumbar - Right Side Bend to R knee joint line    Lumbar - Left Side Bend to L knee joint line    Lumbar - Right Rotation WFL    Lumbar - Left Rotation [facility]      Strength   Strength Assessment Site Hip;Knee;Ankle    Right/Left Hip Right;Left    Right Hip Flexion 5/5    Left Hip Flexion 3+/5    Right/Left Knee Right;Left    Right Knee Flexion 5/5    Right Knee Extension 5/5    Left Knee Flexion 4/5    Left Knee Extension 4-/5    Right/Left Ankle Right;Left    Right Ankle Dorsiflexion 5/5    Right Ankle Plantar Flexion 5/5   modified test in sitting   Left Ankle Dorsiflexion 5/5    Left Ankle Plantar Flexion 5/5   modified test in sitting     Flexibility   Soft Tissue Assessment /Muscle Length yes    Hamstrings Tight B HS noted      Special Tests    Special Tests Lumbar    Lumbar Tests Straight Leg Raise      Straight Leg Raise   Findings Negative    Comment Negative SLR - no symptoms  with either LE                      Objective  measurements completed on examination: See above findings.       [facility] Adult PT Treatment/Exercise - 12/28/19 0001      Self-Care   Self-Care Other Self-Care Comments    Other Self-Care Comments  Pt education regarding HEP, FOTO, anatomy of condition, and peripheralization vs centralization of symptoms.      Exercises   Exercises Lumbar;Knee/Hip      Lumbar Exercises: Prone   Other Prone Lumbar Exercises POE x 2 min then 15 prone press ups      Knee/Hip Exercises: Stretches   Piriformis Stretch Left;2 reps;30 seconds      Knee/Hip Exercises: Supine   Straight Leg Raises Strengthening;Left;20 reps                  PT Education - 12/28/19 1805    Education Details Pt education regarding HEP, FOTO, anatomy of condition, and peripheralization vs centralization of symptoms.    Person(s) Educated Patient    Methods Explanation;Demonstration    Comprehension Verbalized understanding;Returned demonstration            PT Short Term Goals - 12/28/19 2011      PT SHORT TERM GOAL #1   Title Pt will be independent with initial HEP.    Baseline Pt given initial HEP during evaluation 12/28/2019.    Time 3    Period Weeks    Status New    Target Date 01/18/20      PT SHORT TERM GOAL #2   Title Pt will have 25% decreased pain upon waking up in the morning.    Baseline Pt with increased pain and stiffness in the morning    Time 3    Period Weeks    Status New    Target Date 01/18/20      PT SHORT TERM GOAL #3   Title Pt's FOTO score will improve from 26% limited to 23% limited or better.    Baseline 26% limited; predicted 22% limited    Time 3    Period Weeks    Status New    Target Date 01/18/20             PT Long Term Goals - 12/28/19 2014      PT LONG TERM GOAL #1   Title Pt will be independent with advanced HEP.    Baseline Pt given intial HEP at evaluation 12/28/2019.    Time 6    Period Weeks    Status New    Target Date 02/08/20      PT LONG TERM  GOAL #2   Title Pt will improve L hip FL MMT to 4/5 or greater.    Baseline 3+/5    Time 6    Period Weeks    Status New    Target Date 02/08/20      PT LONG TERM GOAL #3   Title Pt's FOTO score will improve from 26% limited to 20% limited or better.    Baseline 26% limited; predicted 22% limited    Time 6    Period Weeks    Status New    Target Date 02/08/20                  Plan - 12/28/19 1827    Clinical Impression Statement Patient is a 34 year old male that presents  to [facility] with chronic left-sided low back pain with occasional radicular pain in LLE. Pt's symptoms fluctuate in intensity throughout the day but remain present fairly constantly. Pt demonstrates L hip weakness compared to RLE. Pt's lumbar AROM is WFL with no symptom aggravation in any plane during evaluation, but pt reports his symptoms will get aggravated at random while in different positions. Pt education provided regarding centralizations vs peripheralization with instructed to perform POE and prone press ups as long as symptoms are centralized but to hold off if symptoms begin to peripheralize. Pt will benefit from skilled PT intervention to increase strength in LLE and obtain relief from LBP to improve function and activity tolerance.    Personal Factors and Comorbidities Comorbidity 1;Time since onset of injury/illness/exacerbation    Comorbidities depression    Examination-Activity Limitations Stand;Stairs;Bend;Squat;Lift;Sleep;Other   pain with prolonged standing/activity; stiffness after sleeping   Examination-Participation Restrictions Community Activity    Stability/Clinical Decision Making Evolving/Moderate complexity    Clinical Decision Making Moderate    Rehab Potential Good    PT Frequency 1x / week    PT Duration 6 weeks    PT Treatment/Interventions ADLs/Self Care Home Management;Cryotherapy;Electrical Stimulation;Iontophoresis 4mg /ml Dexamethasone;Moist Heat;Traction;Gait training;Stair  training;Functional mobility training;Therapeutic activities;Therapeutic exercise;Balance training;Neuromuscular re-education;Patient/family education;Manual techniques;Passive range of motion;Taping    PT Next Visit Plan Review HEP and response to extension bias, assess high level balance, progress LE strengthening interventions (hip ABD, step ups, heel taps, squatting and picking up weighted boxes (to mimic work related activities), postural control exercises, core strengthening)    PT Home Exercise Plan [id number] - POE, prone press ups, piriformis stretch, straight leg raise    Consulted and Agree with Plan of Care Patient           Patient will benefit from skilled therapeutic intervention in order to improve the following deficits and impairments:  Decreased activity tolerance, Decreased strength, Decreased endurance, Pain  Visit Diagnosis: Chronic left-sided low back pain with left-sided sciatica - Plan: PT plan of care cert/re-cert  Pain in left leg - Plan: PT plan of care cert/re-cert  Muscle weakness (generalized) - Plan: PT plan of care cert/re-cert  Difficulty in walking, not elsewhere classified - Plan: PT plan of care cert/re-cert     Problem List Patient Active Problem List   Diagnosis Date Noted  . Chronic left-sided low back pain with left-sided sciatica 12/01/2019  . Genital herpes simplex 09/24/2018  . Overweight (BMI 25.0-29.9) 09/24/2018  . Bipolar affective disorder, current episode depressed (HCC) 09/24/2018  . Generalized anxiety disorder 09/24/2018  . Social anxiety disorder 09/24/2018  . Insomnia 09/24/2018  . Recurrent major depression resistant to treatment (HCC) 09/24/2018  . ADD (attention deficit disorder) without hyperactivity 09/24/2018  . Elevated liver enzymes 09/24/2018  . Drug-induced erectile dysfunction 09/24/2018  . ATTENTION DEFICIT HYPERACTIVITY DISORDER 03/04/2007  . Seasonal and perennial allergic rhinitis 03/04/2007     05/02/2007,  PT, DPT 12/28/19 8:28 PM  [facility] [address] [place], [place], [place] Phone: [phone]   Fax:  [phone]  Name: Terry [name] MRN: Beverly Gust Date of Birth: 1985/07/11

## 2020-01-05 DIAGNOSIS — Z6827 Body mass index (BMI) 27.0-27.9, adult: Secondary | ICD-10-CM | POA: Diagnosis not present

## 2020-01-05 DIAGNOSIS — M5441 Lumbago with sciatica, right side: Secondary | ICD-10-CM | POA: Diagnosis not present

## 2020-01-05 DIAGNOSIS — M545 Low back pain, unspecified: Secondary | ICD-10-CM | POA: Diagnosis not present

## 2020-01-05 DIAGNOSIS — G8929 Other chronic pain: Secondary | ICD-10-CM | POA: Diagnosis not present

## 2020-01-07 ENCOUNTER — Other Ambulatory Visit: Payer: Self-pay | Admitting: Family Medicine

## 2020-01-11 ENCOUNTER — Encounter: Payer: Self-pay | Admitting: Family Medicine

## 2020-01-12 ENCOUNTER — Other Ambulatory Visit: Payer: Self-pay

## 2020-01-12 ENCOUNTER — Ambulatory Visit: Payer: BC Managed Care – PPO

## 2020-01-12 DIAGNOSIS — M5442 Lumbago with sciatica, left side: Secondary | ICD-10-CM | POA: Diagnosis not present

## 2020-01-12 DIAGNOSIS — R262 Difficulty in walking, not elsewhere classified: Secondary | ICD-10-CM

## 2020-01-12 DIAGNOSIS — M79605 Pain in left leg: Secondary | ICD-10-CM

## 2020-01-12 DIAGNOSIS — M6281 Muscle weakness (generalized): Secondary | ICD-10-CM | POA: Diagnosis not present

## 2020-01-12 DIAGNOSIS — G8929 Other chronic pain: Secondary | ICD-10-CM | POA: Diagnosis not present

## 2020-01-12 NOTE — Telephone Encounter (Signed)
Ok to refill??  Last office visit 11/04/2019.  Last refill 12/09/2019.

## 2020-01-12 NOTE — Therapy (Addendum)
[facility] [name] [place], [place], [address] Phone: [phone]   Fax:  [phone]  Physical Therapy Treatment  Patient Details  Name: Terry [name] MRN: [id number] Date of Birth: 18-Jul-1985 Referring Provider (PT): Anson Fret, MD    Encounter Date: 01/12/2020   PT End of Session - 01/12/20 1618    Visit Number 2    Number of Visits 7    Date for PT Re-Evaluation 02/12/20    Authorization Type Blue Cross Blue Shield - FOTO at visit 6 and visit 10    PT Start Time 1619   pt arrived late   PT Stop Time 1658    PT Time Calculation (min) 39 min    Activity Tolerance Patient tolerated treatment well    Behavior During Therapy [facility] for tasks assessed/performed           Past Medical History:  Diagnosis Date  . Acute appendicitis 02/08/2013  . ADHD (attention deficit hyperactivity disorder)   . Allergic rhinitis   . Appendicitis, acute 02/08/2013  . Depression    since 8th grade, hospitalized 69 ([place])  . HSV infection     Past Surgical History:  Procedure Laterality Date  . APPENDECTOMY     2013  . LAPAROSCOPIC APPENDECTOMY N/A 02/08/2013   Procedure: APPENDECTOMY LAPAROSCOPIC;  Surgeon: Velora Heckler, MD;  Location: [facility];  Service: General;  Laterality: N/A;  . WISDOM TOOTH EXTRACTION      There were no vitals filed for this visit.   Subjective Assessment - 01/12/20 1622    Subjective "It's okay. I am feeling it right in here (pointing to L lower back) and glute more lately. If I'm standing for a long time, it starts to feel like an ice pick down my left thigh. If I press on it, it helps with that some."    Pertinent History depression    Limitations Standing    How long can you sit comfortably? "it just depends if I get into certain positions that make it worse, but it's random"    How long can you stand comfortably? difficult to predict but prolonged standing (like at work) aggravates  symptoms    How long can you walk comfortably? "walking usually helps loosen my back up when it's stiff in the morning"    Patient Stated Goals being able to stand for a longer time without pain and do work activities without pain worsening    Currently in Pain? Yes    Pain Score 4     Pain Location Back    Pain Orientation Left;Lower    Pain Descriptors / Indicators Throbbing;Sharp    Pain Type Chronic pain    Pain Onset More than a month ago              [facility] PT Assessment - 01/12/20 0001      Assessment   Medical Diagnosis Left leg weakness R29.898, Lumbosacral radiculopathy at L5 M54.17, Left leg pain M79.605, Chronic left-sided low back pain with left-sided sciatica M54.42, G89.29    Referring Provider (PT) Anson Fret, MD                          [facility] Adult PT Treatment/Exercise - 01/12/20 0001      Self-Care   Self-Care Other Self-Care Comments    Other Self-Care Comments  Reviewed and updated HEP; discussed using rolling pin or similar item available at  home to perform STM along ITB and hip FL.      Lumbar Exercises: Aerobic   Nustep L6 x 5 min      Lumbar Exercises: Standing   Other Standing Lumbar Exercises Pallof press with rotation using 1 blue theraband x 10 in each direction      Lumbar Exercises: Supine   Pelvic Tilt Limitations PPT with alternating marches x 30 (15x each LE)    Bridge with [employer] 20 reps    Other Supine Lumbar Exercises Dead bugs x 20 (10x each UE/LE)      Lumbar Exercises: Sidelying   Hip Abduction Left    Hip Abduction Limitations 25x in R sidelying position      Lumbar Exercises: Quadruped   [place]/Old Horse 20 reps    Other Quadruped Lumbar Exercises Fire hydrant (quadruped with hip horizontal ABD) x 15 each LE    Other Quadruped Lumbar Exercises Thread the needle 10x each UE      Knee/Hip Exercises: Stretches   ITB Stretch Left;2 reps;30 seconds    Piriformis Stretch Left;2 reps;30 seconds    Other  Knee/Hip Stretches Modified Thomas stretch 2 x 30 sec LLE      Knee/Hip Exercises: Standing   Forward Step Up Left;20 reps;Step Height: 6"      Manual Therapy   Manual Therapy Soft tissue mobilization;Myofascial release    Soft tissue mobilization STM, myofascial release, and IASTM along L piriformis, ITB, and vastus lateralis                  PT Education - 01/12/20 1915    Education Details Reviewed and updated HEP; discussed using rolling pin or similar item available at home to perform STM along ITB and hip FL.    Person(s) Educated Patient    Methods Demonstration;Explanation    Comprehension Verbalized understanding;Returned demonstration            PT Short Term Goals - 12/28/19 2011      PT SHORT TERM GOAL #1   Title Pt will be independent with initial HEP.    Baseline Pt given initial HEP during evaluation 12/28/2019.    Time 3    Period Weeks    Status New    Target Date 01/18/20      PT SHORT TERM GOAL #2   Title Pt will have 25% decreased pain upon waking up in the morning.    Baseline Pt with increased pain and stiffness in the morning    Time 3    Period Weeks    Status New    Target Date 01/18/20      PT SHORT TERM GOAL #3   Title Pt's FOTO score will improve from 26% limited to 23% limited or better.    Baseline 26% limited; predicted 22% limited    Time 3    Period Weeks    Status New    Target Date 01/18/20             PT Long Term Goals - 12/28/19 2014      PT LONG TERM GOAL #1   Title Pt will be independent with advanced HEP.    Baseline Pt given intial HEP at evaluation 12/28/2019.    Time 6    Period Weeks    Status New    Target Date 02/08/20      PT LONG TERM GOAL #2   Title Pt will improve L hip FL MMT to 4/5 or greater.  Baseline 3+/5    Time 6    Period Weeks    Status New    Target Date 02/08/20      PT LONG TERM GOAL #3   Title Pt's FOTO score will improve from 26% limited to 20% limited or better.     Baseline 26% limited; predicted 22% limited    Time 6    Period Weeks    Status New    Target Date 02/08/20                 Plan - 01/12/20 1624    Clinical Impression Statement Patient tolerated treatment session well without complaints of increased pain. Pt experienced significant TTP along L ITB and vastus lateralis that was slightly eased with STM, IASTM, and stretches. Pt instructed to perform stretches and exercises before and during work shifts/breaks as able to see if it allows for improved static standing tolerance.    Personal Factors and Comorbidities Comorbidity 1;Time since onset of injury/illness/exacerbation    Comorbidities depression    Examination-Activity Limitations Stand;Stairs;Bend;Squat;Lift;Sleep;Other   pain with prolonged standing/activity; stiffness after sleeping   Examination-Participation Restrictions Community Activity    Stability/Clinical Decision Making Evolving/Moderate complexity    Rehab Potential Good    PT Frequency 1x / week    PT Duration 6 weeks    PT Treatment/Interventions ADLs/Self Care Home Management;Cryotherapy;Electrical Stimulation;Iontophoresis 4mg /ml Dexamethasone;Moist Heat;Traction;Gait training;Stair training;Functional mobility training;Therapeutic activities;Therapeutic exercise;Balance training;Neuromuscular re-education;Patient/family education;Manual techniques;Passive range of motion;Taping    PT Next Visit Plan Assess high level balance, progress LE strengthening interventions (hip ABD, step ups, heel taps, squatting and picking up weighted boxes (to mimic work related activities), postural control exercises, core strengthening), STM PRN and stretches for ITB and hip flexors    PT Home Exercise Plan [id number] - POE, prone press ups, piriformis stretch, straight leg raise, cat/camel, ITB and Thomas stretches, sidelying hip ABD, PPT with marches, dead bugs    Consulted and Agree with Plan of Care Patient           Patient  will benefit from skilled therapeutic intervention in order to improve the following deficits and impairments:  Decreased activity tolerance, Decreased strength, Decreased endurance, Pain  Visit Diagnosis: Chronic left-sided low back pain with left-sided sciatica  Pain in left leg  Muscle weakness (generalized)  Difficulty in walking, not elsewhere classified     Problem List Patient Active Problem List   Diagnosis Date Noted  . Chronic left-sided low back pain with left-sided sciatica 12/01/2019  . Genital herpes simplex 09/24/2018  . Overweight (BMI 25.0-29.9) 09/24/2018  . Bipolar affective disorder, current episode depressed (HCC) 09/24/2018  . Generalized anxiety disorder 09/24/2018  . Social anxiety disorder 09/24/2018  . Insomnia 09/24/2018  . Recurrent major depression resistant to treatment (HCC) 09/24/2018  . ADD (attention deficit disorder) without hyperactivity 09/24/2018  . Elevated liver enzymes 09/24/2018  . Drug-induced erectile dysfunction 09/24/2018  . ATTENTION DEFICIT HYPERACTIVITY DISORDER 03/04/2007  . Seasonal and perennial allergic rhinitis 03/04/2007    05/02/2007, PT, DPT 01/12/20 8:16 PM  [facility] [address] [place], [place], [place] Phone: [phone]   Fax:  [phone]  Name: Terry [name] MRN: Beverly Gust Date of Birth: 1985-04-13

## 2020-01-13 MED ORDER — LISDEXAMFETAMINE DIMESYLATE 50 MG PO CAPS
50.0000 mg | ORAL_CAPSULE | Freq: Every day | ORAL | 0 refills | Status: DC
Start: 2020-01-13 — End: 2020-02-15

## 2020-01-19 ENCOUNTER — Ambulatory Visit: Payer: BC Managed Care – PPO

## 2020-01-19 ENCOUNTER — Other Ambulatory Visit: Payer: Self-pay

## 2020-01-19 DIAGNOSIS — M6281 Muscle weakness (generalized): Secondary | ICD-10-CM | POA: Diagnosis not present

## 2020-01-19 DIAGNOSIS — M79605 Pain in left leg: Secondary | ICD-10-CM

## 2020-01-19 DIAGNOSIS — M5442 Lumbago with sciatica, left side: Secondary | ICD-10-CM | POA: Diagnosis not present

## 2020-01-19 DIAGNOSIS — G8929 Other chronic pain: Secondary | ICD-10-CM

## 2020-01-19 DIAGNOSIS — R262 Difficulty in walking, not elsewhere classified: Secondary | ICD-10-CM | POA: Diagnosis not present

## 2020-01-19 NOTE — Therapy (Signed)
[facility] [address] [place], [place], [address] Phone: [phone]   Fax:  [phone]  Physical Therapy Treatment  Patient Details  Name: JAELYN CLONINGER MRN: [id number] Date of Birth: 1986/01/27 Referring Provider (PT): Anson Fret, MD    Encounter Date: 01/19/2020   PT End of Session - 01/19/20 1706    Visit Number 3    Number of Visits 7    Date for PT Re-Evaluation 02/12/20    Authorization Type Blue [employer] Blue Shield - FOTO at visit 6 and visit 10    PT Start Time 1700    PT Stop Time 1743    PT Time Calculation (min) 43 min    Activity Tolerance Patient tolerated treatment well    Behavior During Therapy [facility] for tasks assessed/performed           Past Medical History:  Diagnosis Date  . Acute appendicitis 02/08/2013  . ADHD (attention deficit hyperactivity disorder)   . Allergic rhinitis   . Appendicitis, acute 02/08/2013  . Depression    since 8th grade, hospitalized 22 ([place])  . HSV infection     Past Surgical History:  Procedure Laterality Date  . APPENDECTOMY     2013  . LAPAROSCOPIC APPENDECTOMY N/A 02/08/2013   Procedure: APPENDECTOMY LAPAROSCOPIC;  Surgeon: Velora Heckler, MD;  Location: [facility];  Service: General;  Laterality: N/A;  . WISDOM TOOTH EXTRACTION      There were no vitals filed for this visit.   Subjective Assessment - 01/19/20 1703    Subjective Pt reports having low back pain but states the pain in his L leg has decreased in intensity since manual therapy and session previous session. He continues to have pain with prolonged standing during work shifts but does not have LLE numbness like before.    Pertinent History depression    Limitations Standing    How long can you sit comfortably? "it just depends if I get into certain positions that make it worse, but it's random"    How long can you stand comfortably? difficult to predict but prolonged standing (like at work)  aggravates symptoms    How long can you walk comfortably? "walking usually helps loosen my back up when it's stiff in the morning"    Patient Stated Goals being able to stand for a longer time without pain and do work activities without pain worsening    Currently in Pain? Yes    Pain Score 6     Pain Location Back    Pain Orientation Left;Lower    Pain Descriptors / Indicators Throbbing;Sharp    Pain Type Chronic pain    Pain Onset More than a month ago              [facility] PT Assessment - 01/19/20 0001      Assessment   Medical Diagnosis Left leg weakness R29.898, Lumbosacral radiculopathy at L5 M54.17, Left leg pain M79.605, Chronic left-sided low back pain with left-sided sciatica M54.42, G89.29    Referring Provider (PT) Anson Fret, MD                          [facility] Adult PT Treatment/Exercise - 01/19/20 0001      Self-Care   Self-Care Other Self-Care Comments    Other Self-Care Comments  Reviewed HEP, discussed TPDN and expected response      Lumbar Exercises: Aerobic   Nustep L5 x  6 min      Lumbar Exercises: Standing   Other Standing Lumbar Exercises Pallof press with rotation using 1 blue theraband x 15 in each direction      Lumbar Exercises: Supine   Dead Bug 10 reps      Lumbar Exercises: Quadruped   Madcat/Old Horse 20 reps    Other Quadruped Lumbar Exercises Fire hydrant (quadruped with hip horizontal ABD) x 15 each LE    Other Quadruped Lumbar Exercises Bird dogs x 10 each UE/LE      Knee/Hip Exercises: Standing   Forward Step Up Left;20 reps;Step Height: 6"    Forward Step Up Limitations R knee drive      Manual Therapy   Manual Therapy Soft tissue mobilization;Myofascial release    Manual therapy comments Skilled palpation, inspection, and observation of L vastus lateralis by Kristoffer Leamon    Soft tissue mobilization STM, myofascial release, IASTM, and roller along L ITB and vastus lateralis            Trigger Point Dry  Needling - 01/19/20 0001    Consent Given? Yes    Education Handout Provided --   verbal education provided   Muscles Treated Lower Quadrant Vastus lateralis    Other Dry Needling --    Vastus lateralis Response Twitch response elicited                PT Education - 01/19/20 1924    Education Details Reviewed HEP, discussed TPDN and expected response. Pt informed not to take a hot shower immediately but that he can after a few hours. Discussed use of rolling pin and handle of butterknife for self-STM.    Person(s) Educated Patient    Methods Explanation;Demonstration;Tactile cues;Verbal cues    Comprehension Verbalized understanding;Returned demonstration;Verbal cues required;Tactile cues required            PT Short Term Goals - 12/28/19 2011      PT SHORT TERM GOAL #1   Title Pt will be independent with initial HEP.    Baseline Pt given initial HEP during evaluation 12/28/2019.    Time 3    Period Weeks    Status New    Target Date 01/18/20      PT SHORT TERM GOAL #2   Title Pt will have 25% decreased pain upon waking up in the morning.    Baseline Pt with increased pain and stiffness in the morning    Time 3    Period Weeks    Status New    Target Date 01/18/20      PT SHORT TERM GOAL #3   Title Pt's FOTO score will improve from 26% limited to 23% limited or better.    Baseline 26% limited; predicted 22% limited    Time 3    Period Weeks    Status New    Target Date 01/18/20             PT Long Term Goals - 12/28/19 2014      PT LONG TERM GOAL #1   Title Pt will be independent with advanced HEP.    Baseline Pt given intial HEP at evaluation 12/28/2019.    Time 6    Period Weeks    Status New    Target Date 02/08/20      PT LONG TERM GOAL #2   Title Pt will improve L hip FL MMT to 4/5 or greater.    Baseline 3+/5    Time  6    Period Weeks    Status New    Target Date 02/08/20      PT LONG TERM GOAL #3   Title Pt's FOTO score will improve from  26% limited to 20% limited or better.    Baseline 26% limited; predicted 22% limited    Time 6    Period Weeks    Status New    Target Date 02/08/20                 Plan - 01/19/20 1706    Clinical Impression Statement Pt tolerated TPDN to L vastus lateralis well with no adverse effects upon skilled palpation, inspection, and observation. Pt expressed soreness but relief in low back and L leg following STM, IASTM, roller, and TPDN.    Personal Factors and Comorbidities Comorbidity 1;Time since onset of injury/illness/exacerbation    Comorbidities depression    Examination-Activity Limitations Stand;Stairs;Bend;Squat;Lift;Sleep;Other   pain with prolonged standing/activity; stiffness after sleeping   Examination-Participation Restrictions Community Activity    Stability/Clinical Decision Making Evolving/Moderate complexity    Rehab Potential Good    PT Frequency 1x / week    PT Duration 6 weeks    PT Treatment/Interventions ADLs/Self Care Home Management;Cryotherapy;Electrical Stimulation;Iontophoresis 4mg /ml Dexamethasone;Moist Heat;Traction;Gait training;Stair training;Functional mobility training;Therapeutic activities;Therapeutic exercise;Balance training;Neuromuscular re-education;Patient/family education;Manual techniques;Passive range of motion;Taping    PT Next Visit Plan Assess response to TPDN to L vastus lateralis. Progress core/LE strengthening interventions (hip ABD, step ups, heel taps, squatting and picking up weighted boxes (to mimic work related activities), postural control exercises, core strengthening), STM PRN and stretches for ITB, hip flexors, and thoracic/lumbar paraspinals. TPDN of L paraspinals if pt interested?    PT Home Exercise Plan [id number] - POE, prone press ups, piriformis stretch, straight leg raise, cat/camel, ITB and Thomas stretches, sidelying hip ABD, PPT with marches, dead bugs    Consulted and Agree with Plan of Care Patient           Patient  will benefit from skilled therapeutic intervention in order to improve the following deficits and impairments:  Decreased activity tolerance, Decreased strength, Decreased endurance, Pain  Visit Diagnosis: Chronic left-sided low back pain with left-sided sciatica  Pain in left leg  Muscle weakness (generalized)  Difficulty in walking, not elsewhere classified     Problem List Patient Active Problem List   Diagnosis Date Noted  . Chronic left-sided low back pain with left-sided sciatica 12/01/2019  . Genital herpes simplex 09/24/2018  . Overweight (BMI 25.0-29.9) 09/24/2018  . Bipolar affective disorder, current episode depressed (HCC) 09/24/2018  . Generalized anxiety disorder 09/24/2018  . Social anxiety disorder 09/24/2018  . Insomnia 09/24/2018  . Recurrent major depression resistant to treatment (HCC) 09/24/2018  . ADD (attention deficit disorder) without hyperactivity 09/24/2018  . Elevated liver enzymes 09/24/2018  . Drug-induced erectile dysfunction 09/24/2018  . ATTENTION DEFICIT HYPERACTIVITY DISORDER 03/04/2007  . Seasonal and perennial allergic rhinitis 03/04/2007     05/02/2007, PT, DPT 01/19/20 7:27 PM  [facility] [address] [place], [place], [place] Phone: [phone]   Fax:  [phone]  Name: KRYSTLE OBERMAN MRN: Beverly Gust Date of Birth: April 16, 1985

## 2020-01-24 ENCOUNTER — Encounter: Payer: BC Managed Care – PPO | Admitting: Neurology

## 2020-01-26 ENCOUNTER — Ambulatory Visit: Payer: BC Managed Care – PPO | Attending: Neurology

## 2020-01-26 ENCOUNTER — Other Ambulatory Visit: Payer: Self-pay

## 2020-01-26 DIAGNOSIS — R262 Difficulty in walking, not elsewhere classified: Secondary | ICD-10-CM | POA: Diagnosis not present

## 2020-01-26 DIAGNOSIS — M5442 Lumbago with sciatica, left side: Secondary | ICD-10-CM | POA: Diagnosis not present

## 2020-01-26 DIAGNOSIS — M79605 Pain in left leg: Secondary | ICD-10-CM | POA: Insufficient documentation

## 2020-01-26 DIAGNOSIS — G8929 Other chronic pain: Secondary | ICD-10-CM

## 2020-01-26 DIAGNOSIS — M6281 Muscle weakness (generalized): Secondary | ICD-10-CM | POA: Diagnosis not present

## 2020-01-26 NOTE — Therapy (Signed)
[facility] [address] [place], [place], [address] Phone: [phone]   Fax:  [phone]  Physical Therapy Treatment  Patient Details  Name: Terry [name] MRN: [id number] Date of Birth: 1985/04/04 Referring Provider (PT): Anson Fret, MD    Encounter Date: 01/26/2020   PT End of Session - 01/26/20 1744    Visit Number 4    Number of Visits 7    Date for PT Re-Evaluation 02/12/20    Authorization Type Blue Cross Blue Shield - FOTO at visit 6 and visit 10    PT Start Time 1745    PT Stop Time 1829    PT Time Calculation (min) 44 min    Activity Tolerance Patient tolerated treatment well    Behavior During Therapy [facility] for tasks assessed/performed           Past Medical History:  Diagnosis Date  . Acute appendicitis 02/08/2013  . ADHD (attention deficit hyperactivity disorder)   . Allergic rhinitis   . Appendicitis, acute 02/08/2013  . Depression    since 8th grade, hospitalized 25 ([place])  . HSV infection     Past Surgical History:  Procedure Laterality Date  . APPENDECTOMY     2013  . LAPAROSCOPIC APPENDECTOMY N/A 02/08/2013   Procedure: APPENDECTOMY LAPAROSCOPIC;  Surgeon: Velora Heckler, MD;  Location: [facility];  Service: General;  Laterality: N/A;  . WISDOM TOOTH EXTRACTION      There were no vitals filed for this visit.   Subjective Assessment - 01/26/20 1745    Subjective Pt reports having soreness for 1-2 days after TPDN to L vastus lateralis. Pt reports continued pain with prolonged standing. He describes his back pain as aching and throbbing.    Pertinent History depression    Limitations Standing    How long can you sit comfortably? "it just depends if I get into certain positions that make it worse, but it's random"    How long can you stand comfortably? difficult to predict but prolonged standing (like at work) aggravates symptoms    How long can you walk comfortably? "walking usually helps  loosen my back up when it's stiff in the morning"    Patient Stated Goals being able to stand for a longer time without pain and do work activities without pain worsening    Currently in Pain? Yes    Pain Score 4     Pain Location Back    Pain Orientation Left;Lower    Pain Descriptors / Indicators Aching;Throbbing    Pain Type Chronic pain    Pain Onset More than a month ago              [facility] PT Assessment - 01/26/20 0001      Assessment   Medical Diagnosis Left leg weakness R29.898, Lumbosacral radiculopathy at L5 M54.17, Left leg pain M79.605, Chronic left-sided low back pain with left-sided sciatica M54.42, G89.29    Referring Provider (PT) Anson Fret, MD                          [facility] Adult PT Treatment/Exercise - 01/26/20 0001      Self-Care   Self-Care Other Self-Care Comments    Other Self-Care Comments  Reviewed HEP, discussed TPDN and expected response      Lumbar Exercises: Stretches   ITB Stretch Limitations Stretch in Ober position x 1 minute    Other Lumbar Stretch Exercise Lumbar  flexion stretch at counter x 30 sec    Other Lumbar Stretch Exercise Child's pose forward and with lateral bias 5 x 5 sec each position      Lumbar Exercises: Aerobic   Nustep L6 x 5 min      Lumbar Exercises: Standing   Other Standing Lumbar Exercises Pallof press with rotation using 1 blue theraband x 15 in each direction      Knee/Hip Exercises: Stretches   ITB Stretch Left;2 reps;30 seconds    ITB Stretch Limitations with green strap      Knee/Hip Exercises: Standing   Forward Step Up Left;20 reps;Step Height: 8"    Forward Step Up Limitations R knee drive    SLS On BOSU (blue) 6 x 10 sec    Other Standing Knee Exercises Lateral stepping and monster walks with green theraband proximal to B knees 2 x 25 feet leading with each LE (4x total) for lateral step and 2 x 25 feet for monster walks      Manual Therapy   Manual Therapy Soft tissue  mobilization;Myofascial release    Manual therapy comments Skilled palpation, inspection, and observation during TPDN of L TFL by [employer].    Soft tissue mobilization STM, myofascial release, IASTM, and roller along L ITB, vastus lateralis, TFL, and L lumbar paraspinals            Trigger Point Dry Needling - 01/26/20 0001    Consent Given? Yes    Education Handout Provided Previously provided    Muscles Treated Back/Hip Tensor fascia lata   Left   Vastus lateralis Response Twitch response elicited                PT Education - 01/26/20 1955    Education Details Reviewed HEP, discussed TPDN and expected response    Person(s) Educated Patient    Methods Explanation;Demonstration;Tactile cues;Verbal cues    Comprehension Verbalized understanding;Returned demonstration;Verbal cues required;Tactile cues required            PT Short Term Goals - 01/26/20 1946      PT SHORT TERM GOAL #1   Title Pt will be independent with initial HEP.    Baseline Pt given initial HEP during evaluation 12/28/2019.    Time 3    Period Weeks    Status Achieved    Target Date 01/18/20      PT SHORT TERM GOAL #2   Title Pt will have 25% decreased pain upon waking up in the morning.    Baseline Pt with increased pain and stiffness in the morning. Updated 01/26/2020: pt reports persistent but reduced symptoms than during evaluation    Time 3    Period Weeks    Status Achieved    Target Date 01/18/20      PT SHORT TERM GOAL #3   Title Pt's FOTO score will improve from 26% limited to 23% limited or better.    Baseline 26% limited; predicted 22% limited. Will reassess FOTO at visit 6.    Time 3    Period Weeks    Status On-going    Target Date 01/18/20             PT Long Term Goals - 12/28/19 2014      PT LONG TERM GOAL #1   Title Pt will be independent with advanced HEP.    Baseline Pt given intial HEP at evaluation 12/28/2019.    Time 6    Period Weeks  Status New     Target Date 02/08/20      PT LONG TERM GOAL #2   Title Pt will improve L hip FL MMT to 4/5 or greater.    Baseline 3+/5    Time 6    Period Weeks    Status New    Target Date 02/08/20      PT LONG TERM GOAL #3   Title Pt's FOTO score will improve from 26% limited to 20% limited or better.    Baseline 26% limited; predicted 22% limited    Time 6    Period Weeks    Status New    Target Date 02/08/20                 Plan - 01/26/20 1943    Clinical Impression Statement Pt tolerated TPDN to L TFL well with no adverse effects upon skilled palpation, inspection, and observation. Pt experienced soreness but also some relief of symptoms following STM, IASTM, and TPDN. Pt was able to perform interventions following TPDN without complaints of significant increase in pain.    Personal Factors and Comorbidities Comorbidity 1;Time since onset of injury/illness/exacerbation    Comorbidities depression    Examination-Activity Limitations Stand;Stairs;Bend;Squat;Lift;Sleep;Other   pain with prolonged standing/activity; stiffness after sleeping   Examination-Participation Restrictions Community Activity    Stability/Clinical Decision Making Evolving/Moderate complexity    Rehab Potential Good    PT Frequency 1x / week    PT Duration 6 weeks    PT Treatment/Interventions ADLs/Self Care Home Management;Cryotherapy;Electrical Stimulation;Iontophoresis 4mg /ml Dexamethasone;Moist Heat;Traction;Gait training;Stair training;Functional mobility training;Therapeutic activities;Therapeutic exercise;Balance training;Neuromuscular re-education;Patient/family education;Manual techniques;Passive range of motion;Taping    PT Next Visit Plan Assess response to TPDN to L TFL. Progress core/LE strengthening interventions (hip ABD, heel taps, postural control exercises, core strengthening), STM PRN and stretches for ITB, hip flexors, and thoracic/lumbar paraspinals.    PT Home Exercise Plan [id number] - POE, prone  press ups, piriformis stretch, straight leg raise, cat/camel, ITB and Thomas stretches, sidelying hip ABD, PPT with marches, dead bugs, lateral stepping and monster walks    Consulted and Agree with Plan of Care Patient           Patient will benefit from skilled therapeutic intervention in order to improve the following deficits and impairments:  Decreased activity tolerance, Decreased strength, Decreased endurance, Pain  Visit Diagnosis: Chronic left-sided low back pain with left-sided sciatica  Pain in left leg  Muscle weakness (generalized)  Difficulty in walking, not elsewhere classified     Problem List Patient Active Problem List   Diagnosis Date Noted  . Chronic left-sided low back pain with left-sided sciatica 12/01/2019  . Genital herpes simplex 09/24/2018  . Overweight (BMI 25.0-29.9) 09/24/2018  . Bipolar affective disorder, current episode depressed (HCC) 09/24/2018  . Generalized anxiety disorder 09/24/2018  . Social anxiety disorder 09/24/2018  . Insomnia 09/24/2018  . Recurrent major depression resistant to treatment (HCC) 09/24/2018  . ADD (attention deficit disorder) without hyperactivity 09/24/2018  . Elevated liver enzymes 09/24/2018  . Drug-induced erectile dysfunction 09/24/2018  . ATTENTION DEFICIT HYPERACTIVITY DISORDER 03/04/2007  . Seasonal and perennial allergic rhinitis 03/04/2007    05/02/2007, PT, DPT 01/26/20 7:55 PM  [facility] [address] [place], [place], [place] Phone: [phone]   Fax:  [phone]  Name: Terry [name] MRN: Beverly Gust Date of Birth: 22-Mar-1985

## 2020-02-02 ENCOUNTER — Other Ambulatory Visit: Payer: Self-pay

## 2020-02-02 ENCOUNTER — Ambulatory Visit: Payer: BC Managed Care – PPO

## 2020-02-02 DIAGNOSIS — G8929 Other chronic pain: Secondary | ICD-10-CM | POA: Diagnosis not present

## 2020-02-02 DIAGNOSIS — R262 Difficulty in walking, not elsewhere classified: Secondary | ICD-10-CM

## 2020-02-02 DIAGNOSIS — M79605 Pain in left leg: Secondary | ICD-10-CM | POA: Diagnosis not present

## 2020-02-02 DIAGNOSIS — M6281 Muscle weakness (generalized): Secondary | ICD-10-CM | POA: Diagnosis not present

## 2020-02-02 DIAGNOSIS — M5442 Lumbago with sciatica, left side: Secondary | ICD-10-CM | POA: Diagnosis not present

## 2020-02-02 NOTE — Therapy (Signed)
[facility]patient Rehabilitation [facility] [address] [place], [place], [address] Phone: [phone]   Fax:  [phone]  Physical Therapy Treatment  Patient Details  Name: Terry [name] MRN: [id number] Date of Birth: [birth year] Referring Provider (PT): Anson Fret, MD    Encounter Date: 02/02/2020   PT End of Session - 02/02/20 1945    Visit Number 5    Number of Visits 7    Date for PT Re-Evaluation 02/12/20    Authorization Type Blue [employer] Blue Shield - FOTO at visit 6 and visit 10    PT Start Time 1745    PT Stop Time 1826    PT Time Calculation (min) 41 min    Activity Tolerance Patient tolerated treatment well    Behavior During Therapy [facility] for tasks assessed/performed           Past Medical History:  Diagnosis Date  . Acute appendicitis 02/08/2013  . ADHD (attention deficit hyperactivity disorder)   . Allergic rhinitis   . Appendicitis, acute 02/08/2013  . Depression    since 8th grade, hospitalized 23 ([place])  . HSV infection     Past Surgical History:  Procedure Laterality Date  . APPENDECTOMY     2013  . LAPAROSCOPIC APPENDECTOMY N/A 02/08/2013   Procedure: APPENDECTOMY LAPAROSCOPIC;  Surgeon: Velora Heckler, MD;  Location: [facility];  Service: General;  Laterality: N/A;  . WISDOM TOOTH EXTRACTION      There were no vitals filed for this visit.   Subjective Assessment - 02/02/20 1752    Subjective "It was a little sore the next day after needling, but I will say it was fine all through the weekend - then Monday or Tuesday I was having to move heavy stuff at work and it got really tight and seized up. It's better now though. I still have the pain but overall, I feel like the pain is less in my legs and moving more towards the back. Last Friday I was standing on concrete potting plants for at least 5 hours, and it was really aggravated. It was better after 1-2 hours but was really painful during."    Pertinent History depression     Limitations Standing    How long can you sit comfortably? "it just depends if I get into certain positions that make it worse, but it's random"    How long can you stand comfortably? difficult to predict but prolonged standing (like at work) aggravates symptoms    How long can you walk comfortably? "walking usually helps loosen my back up when it's stiff in the morning"    Patient Stated Goals being able to stand for a longer time without pain and do work activities without pain worsening    Currently in Pain? Yes    Pain Score 4     Pain Location Back    Pain Orientation Left;Lower    Pain Descriptors / Indicators Aching;Throbbing    Pain Type Chronic pain    Pain Onset More than a month ago              [facility] PT Assessment - 02/02/20 0001      Assessment   Medical Diagnosis Left leg weakness R29.898, Lumbosacral radiculopathy at L5 M54.17, Left leg pain M79.605, Chronic left-sided low back pain with left-sided sciatica M54.42, G89.29    Referring Provider (PT) Anson Fret, MD       Special Tests    Special Tests Leg LengthTest  Other special tests LLE longer with long sit test with no change from supine to longsitting    Leg length test  True      True   Right 36.5 in.    Left  37 in.    Comments 3 layer heel lift provided for R shoe                         [facility] Adult PT Treatment/Exercise - 02/02/20 0001      Ambulation/Gait   Ambulation/Gait Yes    Ambulation/Gait Assistance 7: Independent    Ambulation Distance (Feet) 200 Feet    Gait Pattern Step-through pattern    Gait Comments Gait following application of 3 layer heel lift in R shoe. Pt reports his upper body feels level but heel lift feels strange initially like his R foot is higher but is unsure if that's because it is "just different"      Self-Care   Self-Care Other Self-Care Comments    Other Self-Care Comments  Reviewed HEP, discussed keeping R 3 layer heel lift in shoe to see if  pt has a change/improvement in symptoms and advised to remove if symptoms worsen.      Lumbar Exercises: Stretches   Other Lumbar Stretch Exercise Doorway rhomboid stretch 3 x 30 sec      Lumbar Exercises: Aerobic   Nustep L6 x 6 min      Lumbar Exercises: Quadruped   Madcat/Old Horse 20 reps    Other Quadruped Lumbar Exercises Fire hydrant (quadruped with hip horizontal ABD) x 15 each LE    Other Quadruped Lumbar Exercises Bird dogs x 15 each UE/LE      Knee/Hip Exercises: Standing   Forward Step Up Left;20 reps;Step Height: 8"    Forward Step Up Limitations R knee drive      Manual Therapy   Manual Therapy Soft tissue mobilization;Myofascial release;Joint mobilization    Joint Mobilization Grades I-IV PA joint mobilizations along thoracic and lumbar SP    Soft tissue mobilization STM and myofascial release along L piriformis and L thoracic and lumbar paraspinals                  PT Education - 02/02/20 1944    Education Details Reviewed HEP, discussed keeping R 3 layer heel lift in shoe to see if pt has a change/improvement in symptoms and advised to remove if symptoms worsen.    Person(s) Educated Patient    Methods Explanation;Demonstration;Tactile cues;Verbal cues    Comprehension Verbalized understanding;Returned demonstration;Verbal cues required;Tactile cues required            PT Short Term Goals - 01/26/20 1946      PT SHORT TERM GOAL #1   Title Pt will be independent with initial HEP.    Baseline Pt given initial HEP during evaluation 12/28/2019.    Time 3    Period Weeks    Status Achieved    Target Date 01/18/20      PT SHORT TERM GOAL #2   Title Pt will have 25% decreased pain upon waking up in the morning.    Baseline Pt with increased pain and stiffness in the morning. Updated 01/26/2020: pt reports persistent but reduced symptoms than during evaluation    Time 3    Period Weeks    Status Achieved    Target Date 01/18/20      PT SHORT TERM  GOAL #3   Title Pt's  FOTO score will improve from 26% limited to 23% limited or better.    Baseline 26% limited; predicted 22% limited. Will reassess FOTO at visit 6.    Time 3    Period Weeks    Status On-going    Target Date 01/18/20             PT Long Term Goals - 12/28/19 2014      PT LONG TERM GOAL #1   Title Pt will be independent with advanced HEP.    Baseline Pt given intial HEP at evaluation 12/28/2019.    Time 6    Period Weeks    Status New    Target Date 02/08/20      PT LONG TERM GOAL #2   Title Pt will improve L hip FL MMT to 4/5 or greater.    Baseline 3+/5    Time 6    Period Weeks    Status New    Target Date 02/08/20      PT LONG TERM GOAL #3   Title Pt's FOTO score will improve from 26% limited to 20% limited or better.    Baseline 26% limited; predicted 22% limited    Time 6    Period Weeks    Status New    Target Date 02/08/20                 Plan - 02/02/20 1947    Clinical Impression Statement Patient tolerated treatment session well with no adverse effects or complaints of significant increase in pain. Pt experienced a minimal burning pain during step ups while ascending with LLE that did not linger afterwards. Measurement from each ASIS to medial malleoli revealed leg length discrepancy with LLE being .5 inches longer than RLE. Pt provided with 3 layer heel lift for R shoe and advised to see if symptoms ease. Pt instructed to remove heel lift if symptoms worsen. He will continue to benefit from skilled PT to improve mobility and increase tolerance to work and functional activities.    Personal Factors and Comorbidities Comorbidity 1;Time since onset of injury/illness/exacerbation    Comorbidities depression    Examination-Activity Limitations Stand;Stairs;Bend;Squat;Lift;Sleep;Other   pain with prolonged standing/activity; stiffness after sleeping   Examination-Participation Restrictions Community Activity    Stability/Clinical Decision  Making Evolving/Moderate complexity    Rehab Potential Good    PT Frequency 1x / week    PT Duration 6 weeks    PT Treatment/Interventions ADLs/Self Care Home Management;Cryotherapy;Electrical Stimulation;Iontophoresis 4mg /ml Dexamethasone;Moist Heat;Traction;Gait training;Stair training;Functional mobility training;Therapeutic activities;Therapeutic exercise;Balance training;Neuromuscular re-education;Patient/family education;Manual techniques;Passive range of motion;Taping    PT Next Visit Plan Re-evaluation. Inquire about 3 layer heel lift in R shoe. Progress core/LE strengthening interventions (hip ABD, heel taps, postural control exercises, core strengthening), STM PRN and stretches for ITB, hip flexors, and thoracic/lumbar paraspinals.    PT Home Exercise Plan [id number] - POE, prone press ups, piriformis stretch, straight leg raise, cat/camel, ITB and Thomas stretches, sidelying hip ABD, PPT with marches, dead bugs, lateral stepping and monster walks    Consulted and Agree with Plan of Care Patient           Patient will benefit from skilled therapeutic intervention in order to improve the following deficits and impairments:  Decreased activity tolerance, Decreased strength, Decreased endurance, Pain  Visit Diagnosis: Chronic left-sided low back pain with left-sided sciatica  Pain in left leg  Muscle weakness (generalized)  Difficulty in walking, not elsewhere classified     Problem List  Patient Active Problem List   Diagnosis Date Noted  . Chronic left-sided low back pain with left-sided sciatica 12/01/2019  . Genital herpes simplex 09/24/2018  . Overweight (BMI 25.0-29.9) 09/24/2018  . Bipolar affective disorder, current episode depressed (HCC) 09/24/2018  . Generalized anxiety disorder 09/24/2018  . Social anxiety disorder 09/24/2018  . Insomnia 09/24/2018  . Recurrent major depression resistant to treatment (HCC) 09/24/2018  . ADD (attention deficit disorder) without  hyperactivity 09/24/2018  . Elevated liver enzymes 09/24/2018  . Drug-induced erectile dysfunction 09/24/2018  . ATTENTION DEFICIT HYPERACTIVITY DISORDER 03/04/2007  . Seasonal and perennial allergic rhinitis 03/04/2007     Rhea Bleacher, PT, DPT 02/02/20 7:58 PM  [facility] Out[facility] Rehabilitation [facility] [address] [place], [place], [address] Phone: [phone]   Fax:  [phone]  Name: BILLIE INTRIAGO MRN: [id number] Date of Birth: [birth year]

## 2020-02-09 ENCOUNTER — Ambulatory Visit: Payer: BC Managed Care – PPO

## 2020-02-09 ENCOUNTER — Other Ambulatory Visit: Payer: Self-pay

## 2020-02-09 DIAGNOSIS — M6281 Muscle weakness (generalized): Secondary | ICD-10-CM | POA: Diagnosis not present

## 2020-02-09 DIAGNOSIS — M79605 Pain in left leg: Secondary | ICD-10-CM | POA: Diagnosis not present

## 2020-02-09 DIAGNOSIS — R262 Difficulty in walking, not elsewhere classified: Secondary | ICD-10-CM

## 2020-02-09 DIAGNOSIS — G8929 Other chronic pain: Secondary | ICD-10-CM | POA: Diagnosis not present

## 2020-02-09 DIAGNOSIS — M5442 Lumbago with sciatica, left side: Secondary | ICD-10-CM | POA: Diagnosis not present

## 2020-02-09 NOTE — Therapy (Signed)
[facility] Outpatient Rehabilitation [facility] [address] [place], [place], [address] Phone: [phone]   Fax:  [phone]  Physical Therapy Re-evaluation  Patient Details  Name: WOODFORD STREGE MRN: [id number] Date of Birth: 1985/03/14 Referring Provider (PT): Anson Fret, MD    Encounter Date: 02/09/2020   PT End of Session - 02/09/20 1756    Visit Number 6    Number of Visits 12    Date for PT Re-Evaluation 03/25/20    Authorization Type Blue Cross Blue Shield - FOTO at visit 10    PT Start Time 1746    PT Stop Time 1825    PT Time Calculation (min) 39 min    Activity Tolerance Patient tolerated treatment well    Behavior During Therapy [facility] for tasks assessed/performed           Past Medical History:  Diagnosis Date  . Acute appendicitis 02/08/2013  . ADHD (attention deficit hyperactivity disorder)   . Allergic rhinitis   . Appendicitis, acute 02/08/2013  . Depression    since 8th grade, hospitalized 46 ([place])  . HSV infection     Past Surgical History:  Procedure Laterality Date  . APPENDECTOMY     2013  . LAPAROSCOPIC APPENDECTOMY N/A 02/08/2013   Procedure: APPENDECTOMY LAPAROSCOPIC;  Surgeon: Velora Heckler, MD;  Location: [facility];  Service: General;  Laterality: N/A;  . WISDOM TOOTH EXTRACTION      There were no vitals filed for this visit.   Subjective Assessment - 02/09/20 1750    Subjective Patient reports having more low back and LLE pain over the past week due to working indoors and standing on the concrete for longer periods of time than usual. Patient kept the hel lift in his shoe for 3 days and removed it due to foot pain and discomfort secondary to it altering the effectiveness of the orthotics in his shoes.    Pertinent History depression    Limitations Standing    How long can you sit comfortably? "it just depends if I get into certain positions that make it worse, but it's random"    How long can you stand  comfortably? difficult to predict but prolonged standing (like at work) aggravates symptoms    How long can you walk comfortably? "walking usually helps loosen my back up when it's stiff in the morning"    Patient Stated Goals being able to stand for a longer time without pain and do work activities without pain worsening    Currently in Pain? Yes    Pain Score 6     Pain Location Back   to LLE   Pain Orientation Left;Lower    Pain Descriptors / Indicators Aching;Throbbing    Pain Type Chronic pain    Pain Onset More than a month ago              [facility] PT Assessment - 02/09/20 0001      Assessment   Medical Diagnosis Left leg weakness R29.898, Lumbosacral radiculopathy at L5 M54.17, Left leg pain M79.605, Chronic left-sided low back pain with left-sided sciatica M54.42, G89.29    Referring Provider (PT) Anson Fret, MD       Observation/Other Assessments   Focus on Therapeutic Outcomes (FOTO)  28% limited; predicted 22% limited      AROM   Lumbar Flexion Can grab toes with tennis shoes on    Lumbar Extension WFL but pain in L low back    Lumbar -  Right Side Bend to level of inferior pole of patella    Lumbar - Left Side Bend to level of inferior pole of patella    Lumbar - Right Rotation WFL with pain in L low back    Lumbar - Left Rotation [facility]      Strength   Right Hip Flexion 5/5    Right Hip ABduction 5/5    Right Hip ADduction 5/5    Left Hip Flexion 4+/5    Left Hip ABduction 4+/5    Left Hip ADduction 4+/5    Right Knee Flexion 5/5    Right Knee Extension 5/5    Left Knee Flexion 5/5    Left Knee Extension 5/5    Right Ankle Dorsiflexion 5/5    Right Ankle Plantar Flexion 5/5    Left Ankle Dorsiflexion 5/5    Left Ankle Plantar Flexion 5/5                         [facility] Adult PT Treatment/Exercise - 02/09/20 0001      Self-Care   Self-Care Other Self-Care Comments    Other Self-Care Comments  Reviewed HEP and expected response to TPDN of  lumbar multifidi.      Lumbar Exercises: Aerobic   Nustep L6 x 5 min      Lumbar Exercises: Quadruped   Madcat/Old Horse 20 reps    Other Quadruped Lumbar Exercises Fire hydrant (quadruped with hip horizontal ABD) x 20 each LE    Other Quadruped Lumbar Exercises Child's pose 15 x 5 sec holds      Manual Therapy   Manual Therapy Soft tissue mobilization;Myofascial release    Manual therapy comments Skilled inspection, palpation, and observation during TPDN of lumbar multifidi by [employer].    Soft tissue mobilization STM and myofascial release along lumbar and thoracic paraspinals            Trigger Point Dry Needling - 02/09/20 0001    Consent Given? Yes    Education Handout Provided Previously provided    Muscles Treated Back/Hip Lumbar multifidi    Lumbar multifidi Response Twitch response elicited                PT Education - 02/09/20 1945    Education Details Reviewed HEP and expected response to TPDN of lumbar multifidi.    Person(s) Educated Patient    Methods Explanation    Comprehension Verbalized understanding            PT Short Term Goals - 02/09/20 1950      PT SHORT TERM GOAL #1   Title Pt will be independent with initial HEP.    Baseline Pt given initial HEP during evaluation 12/28/2019.    Time 3    Period Weeks    Status Achieved    Target Date 01/18/20      PT SHORT TERM GOAL #2   Title Pt will have 25% decreased pain upon waking up in the morning.    Baseline Pt with increased pain and stiffness in the morning. Updated 01/26/2020: pt reports persistent but reduced symptoms than during evaluation    Time 3    Period Weeks    Status Achieved    Target Date 01/18/20      PT SHORT TERM GOAL #3   Title Pt's FOTO score will improve from 26% limited to 23% limited or better.    Baseline 26% limited; predicted 22% limited. 02/09/2020:  28% limited; predicted 22% limited    Time 3    Period Weeks    Status On-going    Target Date  01/18/20             PT Long Term Goals - 02/09/20 1951      PT LONG TERM GOAL #1   Title Pt will be independent with advanced HEP.    Baseline Pt given intial HEP at evaluation 12/28/2019.    Time 6    Period Weeks    Status On-going      PT LONG TERM GOAL #2   Title Pt will improve L hip FL MMT to 4/5 or greater.    Baseline 3+/5. 02/09/2020: 4+/5    Time 6    Period Weeks    Status Achieved      PT LONG TERM GOAL #3   Title Pt's FOTO score will improve from 26% limited to 20% limited or better.    Baseline 26% limited; predicted 22% limited. 02/09/2020: 28% limited; predicted 22% limited    Time 6    Period Weeks    Status On-going                 Plan - 02/09/20 1756    Clinical Impression Statement Patient tolerated treatment session well with decreased TTP in lumbosacral region and ease of symptoms following TPDN to bilateral lumbar multifidi. He was able to perform interventions without significant increase in pain. Pt demonstrates improvement in LLE strength since initial evaluation but continues to have decreased L hip strength compared to RLE. He expressed decrease in LLE numbness overall but continued pain and throbbing in L low back and LLE that has worsened as he has been standing on concrete more throughout workshifts compared to standing on grass outside when the weather was warmer. He should benefit from continued skilled PT to further increase lumbar mobility and LLE/core strength to allow for improved tolerance throughout work shifts.    Personal Factors and Comorbidities Comorbidity 1;Time since onset of injury/illness/exacerbation    Comorbidities depression    Examination-Activity Limitations Stand;Stairs;Bend;Squat;Lift;Sleep;Other   pain with prolonged standing/activity; stiffness after sleeping   Examination-Participation Restrictions Community Activity    Stability/Clinical Decision Making Evolving/Moderate complexity    Rehab Potential Good     PT Frequency 1x / week    PT Duration 6 weeks    PT Treatment/Interventions ADLs/Self Care Home Management;Cryotherapy;Electrical Stimulation;Iontophoresis 4mg /ml Dexamethasone;Moist Heat;Traction;Gait training;Stair training;Functional mobility training;Therapeutic activities;Therapeutic exercise;Balance training;Neuromuscular re-education;Patient/family education;Manual techniques;Passive range of motion;Taping    PT Next Visit Plan Assess response to TPDN of lumbar multifidi. Update HEP. Progress core/LE strengthening interventions, STM PRN and stretches for ITB, hip flexors, and thoracic/lumbar paraspinals.    PT Home Exercise Plan [id number] - POE, prone press ups, piriformis stretch, straight leg raise, cat/camel, ITB and Thomas stretches, sidelying hip ABD, PPT with marches, dead bugs, lateral stepping and monster walks    Consulted and Agree with Plan of Care Patient           Patient will benefit from skilled therapeutic intervention in order to improve the following deficits and impairments:  Decreased activity tolerance,Decreased strength,Decreased endurance,Pain  Visit Diagnosis: Chronic left-sided low back pain with left-sided sciatica  Pain in left leg  Muscle weakness (generalized)  Difficulty in walking, not elsewhere classified     Problem List Patient Active Problem List   Diagnosis Date Noted  . Chronic left-sided low back pain with left-sided sciatica 12/01/2019  . Genital herpes simplex  09/24/2018  . Overweight (BMI 25.0-29.9) 09/24/2018  . Bipolar affective disorder, current episode depressed (HCC) 09/24/2018  . Generalized anxiety disorder 09/24/2018  . Social anxiety disorder 09/24/2018  . Insomnia 09/24/2018  . Recurrent major depression resistant to treatment (HCC) 09/24/2018  . ADD (attention deficit disorder) without hyperactivity 09/24/2018  . Elevated liver enzymes 09/24/2018  . Drug-induced erectile dysfunction 09/24/2018  . ATTENTION DEFICIT  HYPERACTIVITY DISORDER 03/04/2007  . Seasonal and perennial allergic rhinitis 03/04/2007     Rhea BleacherKajal Kaitlynd Phillips, PT, DPT 02/09/20 7:58 PM  [facility] [address] [place], [place], [address] Phone: [phone]   Fax:  [phone]  Name: Beverly GustBranson S Buzzell MRN: [id number] Date of Birth: [birth year]

## 2020-02-15 ENCOUNTER — Encounter: Payer: Self-pay | Admitting: Family Medicine

## 2020-02-15 MED ORDER — LISDEXAMFETAMINE DIMESYLATE 50 MG PO CAPS
50.0000 mg | ORAL_CAPSULE | Freq: Every day | ORAL | 0 refills | Status: DC
Start: 2020-02-15 — End: 2020-03-15

## 2020-02-15 NOTE — Telephone Encounter (Signed)
Ok to refill??  Last office visit 11/04/2019.  Last refill 01/13/2020.

## 2020-02-17 ENCOUNTER — Other Ambulatory Visit: Payer: Self-pay

## 2020-02-17 ENCOUNTER — Ambulatory Visit (INDEPENDENT_AMBULATORY_CARE_PROVIDER_SITE_OTHER): Payer: BC Managed Care – PPO | Admitting: Family Medicine

## 2020-02-17 VITALS — BP 122/64 | HR 71 | Temp 98.8°F | Ht 71.0 in | Wt 199.0 lb

## 2020-02-17 DIAGNOSIS — F988 Other specified behavioral and emotional disorders with onset usually occurring in childhood and adolescence: Secondary | ICD-10-CM

## 2020-02-17 DIAGNOSIS — F322 Major depressive disorder, single episode, severe without psychotic features: Secondary | ICD-10-CM

## 2020-02-17 DIAGNOSIS — E038 Other specified hypothyroidism: Secondary | ICD-10-CM

## 2020-02-17 NOTE — Progress Notes (Signed)
Subjective:    Patient ID: Terry [name], male    DOB: [birth year], 34 y.o.   MRN: [id number]  Shoulder Pain   Medication Refill   06/25/16 Patient is a a pleasant 34 year old Caucasian male here today to establish care. He recently took a job in [place] and he will be moving to [place] for the next week. Therefore he will likely not follow up here in the future. However I see both of his parents and he would like a primary care provider in the area in case the job in [place] does not work out. 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 In fact he was hospitalized in the past with suicidal ideation while in [place]. However over the last 2-3 years he's been doing remarkably better. He has been having telephone conferences with the psychiatrist in [place] who is helped him substantially. He is currently on high-dose trintellix 20 mg a day and Abilify 10 mg a day. This combination seems to work better for him than any other combination he is tried in the past. He has no bipolar tendencies. He is only taking the Abilify for severe depression. Past medical history is complicated however by attention deficit disorder. He takes Adderall XR 20 mg twice a day. He has been on this dose for many years. Even though he is graduated, he finds it unable to focus to complete regular task. He finds it easy to become distracted. He reports poor organization and poor listening skills without the medication. He is very concerned about starting his new job in [place] without the medication. At the present time he states that his depression is well controlled. He denies any suicidal ideation. He states he still has his good days and his bad days however he hasn't a severe exacerbation in over 3 years. He denies any trouble getting out of bed in the morning. He denies any problems with appetite or energy level. He does report insomnia for which he takes Ambien. He's  been taking Ambien for the last 3 or 4 months. We had a long discussion today about habituation and dependency. I recommended trying to use this medication sparingly.  At that time, my plan was: At the present time I'll make no changes in his dose of medication. Continue Adderall XR 20 mg by mouth every morning and every afternoon. Patient was given 60 tablets and refills for 2 months but postdated. I will continue Trintellix as well as his Abilify until he is able to establish a primary care provider in [place]. I would like him to come in fasting for a CBC, fasting lipid panel, and a CMP given the fact he is taking Abilify. I recommended he try to use Ambien sparingly to avoid habituation and dependency. Recheck in 6 months if not establish with primary care provider or sooner if symptoms arise  10/11/16 Patient states that the Adderall seems to wear off early in the afternoon. He is currently taking Adderall XR 40 mg in the morning. He is taking both 20 mg tablets first thing in morning. The medication works well until about 4:00 and then he feels a "crash" he is unable to focus the rest of the day. He denies any insomnia. He denies any anxiety. He denies any palpitations or tachycardia. From the standpoint of his depression, he is doing overall very well. He is taking Abilify in the morning which might be contributing some to the  crash later in the day. He denies any suicidal ideation, hallucinations, or delusion. He is a little sad recently as he and his girlfriend have ended their relationship but overall he seems to be doing well.  At that time, my plan was: I have recommended the patient begin Adderall XR 25 mg tablets but I want him to take them twice a day and try to spread it out. I like him to take first with breakfast and the second one around 2:00 in the afternoon. I believe that will give him more sustained benefit throughout the day without drastically increasing his dose. I also  recommended that he take the Abilify at night when trying to go to sleep so that hopefully will not contribute to any fatigue or crash later in the day. Recheck in 6 months or as needed.   02/20/17 Patient was admitted to the hospital in [place] in August due to depression. At that time they discontinued Ambien and replace it with Remeron 15 mg a day. Since that time he has done fairly well. However he states that he is never completely felt that the depression went into remission. Recently his girlfriend ended their relationship. As a result his depression has drastically worsened. He also ran out of his Abilify and Remeron more than a week ago. That coupled with the breakup of his girlfriend coupled with the holidays his left him and severe depression. He denies any suicidal ideation or suicidal plan. He denies any hallucinations or delusions. He is not a threat to himself or to anyone else. However he is extremely sad. He tentatively plan to go back to [place] today. However I do not believe this is a good idea. He would be alone in [place] and I would much rather him be here with his family who can left him and can supervise/monitor him. He is in agreement with this plan. He also requests STD testing. He was recently diagnosed with epididymitis after a sexual relationship of the person that he did not know very well. He has not been checked for gonorrhea or chlamydia or HIV and he is requesting that today.  AT that time, my plan was: Increase Remeron to 30 mg a day and recheck here in one week. Do not go back to [place]. The patient will stay here with his family or he can be monitored more closely. Resume history trintellix and his Abilify which he ran out of.  Screen the patient for HIV, gonorrhea, Chlamydia per his request. Reassess the patient in one week  02/27/17 Patient is doing much better today. The depression is much better now that he is back on his medication. He  also feels better now that he is made to the holidays. His parents have been watching him closely over the last week. He feels safe and supported. He certainly feels like he is coming out of the tunnel. From that standpoint I'm very happy today that he is doing better. He continues to complain of some pain in his left testicle. On exam, the left testicle is not swollen. There is no mass or nodule. He does have some pain and tenderness over the epididymis concerning for epididymitis. There is no lymphadenopathy. He also reports erectile dysfunction due to his medication.  At that time, my plan was: I will treat epididymitis with doxycycline 100 mg by mouth twice a day for 10 days. Obtain ultrasound if pain persists. Continue his current medications for his depression as he  is doing better. Patient denies any suicidal ideation and feels safe to return back home to resume work. I encouraged him to find a psychiatrist in [place] for follow-up. I will give the patient Viagra, 50-100 mg by mouth daily when necessary sexual activity. I believe his erectile dysfunction secondary to medication, particularly his Abilify  07/31/17 Patient has since moved back home from [place] and is living with his parents again in [place].  He never established with a psychiatrist in [place] as we had discussed.  Instead he was continuing to [facility].  That psychiatrist started him on Latuda in addition to Abilify.  Patient states that he has had his ups and downs but overall his depression is much better than it was earlier this year.  I question the combination of Latuda and Abilify.  He is not certain whether his psychiatrist intended to keep him on this combination in addition to his Trintellix.  He continues to report erectile dysfunction and decreased libido.  He denies any gynecomastia.  He continues to do well on his current dose of Adderall.  He denies any  suicidal thoughts.  He denies any mania.  He denies any insomnia.  He denies any increase in goal-directed behavior.  He denies any racing thoughts, tangential thoughts.  He has normal speech.  He has normal behavior.  He denies any hallucinations or delusions.  AT that time, my plan was: I recommended discontinuation of Abilify given the fact the patient seems to be doing better on Latuda.  I explained to him that I do not use these medications in combination given the fact they are in the same pharmacologic class due to the risk of side effects and polypharmacy.  Therefore I will continue him on his current dose of Latuda in addition to Trintellix.  I will also make no changes in his dose of Adderall for ADD.  However I have recommended that he establish with a local psychiatrist.  As I explained to the patient, I believe his situation is more complex than what I feel comfortable managing and that he needs a psychiatric expert to help manage his medication.  I do not feel that his current method of intermittent follow-up via telephone is sufficient.  Therefore I provided him with the contact information for local psychiatrist, Dr. Betti Cruzeddy and strongly encouraged him to call and make that appointment.  05/04/19 Patient has been working with a psychiatrist in another state battling his depression now for many years.  He states that he has been on virtually every medication that exist.  He states that he has 2 bags of medication at home and none of them have helped him.  He states that he has been on virtually every SSRI.  He has taken Latuda, Rexulti, Seroquel.  He does not recall Zyprexa.  He does remember being on Abilify as well.  He is also taken lithium.  None of these medications provided him any relief.  He even tried TMS therapy on 2 separate occasions with no benefit.  Most recently his psychiatrist referred him for ECT therapy.  He tried this on 2 separate occasions with no benefit.  He feels that he is at  his wits end.  He fears waking up every day due to the persistent, hopeless feeling of depression.  He sees no sign of improvement.  He has been unemployed for more than a year.  He lives at home with his  parents and spends a lot of time with his mother to avoid being lonely.  The depression keeps him from being able to find gainful employment.  He states that he does not have many friends.  He does not have a current relationship.  All of these reasons make him feel hopeless about the future.  He denies mania.  He denies racing thoughts.  He denies insomnia.  He denies any tangential thoughts.  He denies any hallucinations or delusions.  He denies any homicidal ideation.  He denies any plan for suicide although he admits that sometimes he wishes that it would all stop.  He denies any active plan of suicide or intention to harm himself.  He feels scared and somewhat hopeless that his psychiatrist has not been able to help him and he would like a referral to a psychiatrist in the area or at least some plan moving forward to help manage his depression.  At that time, my plan was: My heart goes out to this patient.  His depression seems severe although he is not actively suicidal.  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  I will also try to expedite referral to a psychiatrist.  I plan to see the patient back weekly until psychiatry sees the patient.  We contracted today for safety and I encouraged the patient that he should go to the hospital immediately if he begins to feel suicidal or has thoughts of wanting to hurt himself.  05/11/19  Thankfully, the patient seems to be doing much better this week.  He states that he feels better.  He realizes the medication does not take effect back quickly.  There have been some other social situations that have helped but overall he seems to be much healthier place today.  He feels better.  He is not  contemplating suicide and denies any suicidal plans or thoughts.  Obviously there is still a long way to go but there definitely has been improvement.  His TSH was mildly elevated at 4.65.  The patient was taking Cytomel 80 mcg a day.  I question if his previous psychiatrist was using this off label to help treat depression or to prevent cognitive impairment from ECT.  We had a long discussion today about possibly increasing the dose which will be difficult as he is on the highest tablet strength or potentially switching to Synthroid.  At that time, my plan was: I am very happy that the patient seems to be doing better today.  We will continue his current dose of Prozac and Zyprexa (20 mg and 5 mg) at the present time.  I would like to allow the patient more time for these to take effect.  Reassess the patient in 1 month or sooner if symptoms develop.  I will also follow-up to ensure that he has been referred to a psychiatrist as he is yet to hear back from scheduling.  Recheck TSH in 4 to 6 weeks.  If still elevated at that time, we can potentially increase the dose of Cytomel to 75 mcg or switch to Synthroid.  Reassess at follow-up in 1 month.  07/01/19   Great news, the patient continues to be doing well.  He denies any depression.  He states that he has been doing excellent ever since he started the Zyprexa.  His weight is stable.  At the present time he is not interested in increasing his  Cytomel.  He denies any manic symptoms.  He denies any insomnia.  He denies any suicidal ideation.  He is due for refill on his Adderall XR 35 mg a day that he takes for ADD.  Without medication he is unable to focus.  Furthermore medication also seems to help with his severe depression and helps with energy drive and motivation.  At that time, my plan was: Thankfully his depression is currently in remission.  Continue Prozac and Zyprexa at their current doses.  I will recheck the patient in 6 months.  If stable at that  time, I would likely try to wean the patient down on his antipsychotic medication.  We will like to continue his current dose of Cytomel.  I will refill his Adderall XR 35 mg a day.  I would like patient to come in fasting for CBC, CMP, and fasting lipid panel  08/10/19 Unfortunately, over the last 2 to 3 weeks, the patient states that his depression has relapsed.  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  He is sweating excessively.  He believes he may have had heat exhaustion causing him to have to rest for 2 or 3 days.  However this started him feeling fatigue leading him to feel like he was falling back into his depression.  Since that time self-doubt has risen.  He feels decreasing focus, decreasing energy, anhedonia, and sadness.  He is not as bad as he was before but he is very concerned that he is relapsing and the fear of relapse seems to exacerbate the depression.  His biggest issue now appears to be his lack of energy, his lack of drive, and his lack of focus.  He also complains of bilateral leg pain.  He states that he has had degenerative disc disease in his back and at one point required an epidural steroid injection.  At that time he was having left-sided sciatica.  However he is now having burning stinging pain in both legs.  The pain radiates from his anterior pelvis down the anterior thigh to just below the knee.  It is a sharp stinging burning pain.  There is no exacerbating or alleviating factors.  At that time, my plan was: Depression seems to have worsened and is primarily due to lack of energy, lack of drive, and lack of concentration.  Therefore I recommended that we discontinue Adderall and replaced with Vyvanse 40 mg a day.  I believe that due to its longer half-life and better efficacy it would better control the patient's energy and focus and concentration.  I believe he has been habituated to Adderall and therefore is not as effective for him.  I hope that with improved  energy and concentration this will improve his self-confidence and help improve his depression as well.  I will also check his TSH, free T3, and free T4.  We may need to discontinue his Cytomel and replaced with levothyroxine in an effort to improve his energy level and focus and concentration.  The bilateral leg pain appears to be due to peripheral neuropathy.  However he has a very atypical pattern.  Therefore I recommended nerve conduction studies to evaluate further to determine if he has a lumbosacral radiculopathy or peripheral neuropathy.  11/04/19 Patient states that he feels like his depression is being well managed.  Overall he denies any anhedonia or suicidal thoughts.  At the present time he has some anxiety over his financial situation.  Overall though however he  is doing much better.  Unfortunately the Vyvanse 40 mg does not seem to help his focus last as long throughout the day.  He finds himself easily distracted earlier each day.  It is hard for him to finish his task at work.  He finds his mind racing at times.  He denies any tangential speech or flight of ideas.  He denies any delusions or hallucinations.  Instead he has a hard time maintaining focus consistent with his underlying diagnosis of ADD.  He is also due to recheck his thyroid test.  At that time, my plan was: Check a TSH to ensure adequate thyroid hormone level replacement.  Fortunately the Zyprexa seems to be working well managing his depression.  I will increase his Vyvanse to 50 mg a day.  Strongly recommended the patient receive his Covid vaccine.  02/17/20 Patient presents today with 2 concerns.  First, we increase his Vyvanse to 50 mg at his last visit.  He states that over the last 4 to 6 weeks he has noticed a decline in his ability to pay attention.  He finds himself easily distracted.  At work he will forget assignments that are given to him.  He will make careless mistakes due to lack of focus and becoming easily  distracted.  At this point is not causing a serious problem for him.  He is not being criticized at work or Teacher, music issues at work.  Is not causing him relationship issues but he is certainly noticed a decline in his ability to maintain focus.  None of his other medications have been changed recently.  From a depression standpoint he states that he is actually doing really well.  He states that things could always be better however he denies any ongoing suicidal thoughts or anhedonia.  He is dating.  He is not in a serious relationship but he is casually dating.  He is more active socially than before.  Therefore I do not feel that the depression is the cause of his lack of focus.  His second concern is loss of hearing.  He states that he has noticed what appears to be a bilateral decline in his hearing suddenly over the last few months.  He states that he has to turn the volume on the TV up significantly higher for him to be able to discern conversations.  He is also reporting a difficult time hearing conversations in a crowded environment.  He denies any tinnitus.  He denies any otalgia.  He denies any headache.  We administer during explained today in office with the patient present.  There was no reportable hearing deficit while here.  He had very mild high-frequency hearing loss at 4000 Hz but this was slightly.  Therefore there was no significant hearing loss thankfully.  Past Medical History:  Diagnosis Date  . Acute appendicitis 02/08/2013  . ADHD (attention deficit hyperactivity disorder)   . Allergic rhinitis   . Appendicitis, acute 02/08/2013  . Depression    since 8th grade, hospitalized 60 ([place])  . HSV infection    Past Surgical History:  Procedure Laterality Date  . APPENDECTOMY     2013  . LAPAROSCOPIC APPENDECTOMY N/A 02/08/2013   Procedure: APPENDECTOMY LAPAROSCOPIC;  Surgeon: Velora Heckler, MD;  Location: [facility];  Service: General;  Laterality: N/A;  . WISDOM TOOTH  EXTRACTION     Current Outpatient Medications on File Prior to Visit  Medication Sig Dispense Refill  . FLUoxetine (PROZAC) 20 MG  capsule TAKE 1 CAPSULE BY MOUTH EVERY DAY 90 capsule 2  . levothyroxine (SYNTHROID) 50 MCG tablet TAKE 1 TABLET BY MOUTH EVERY DAY 90 tablet 1  . lisdexamfetamine (VYVANSE) 50 MG capsule Take 1 capsule (50 mg total) by mouth daily. 30 capsule 0  . OLANZapine (ZYPREXA) 5 MG tablet TAKE 1 TABLET BY MOUTH EVERYDAY AT BEDTIME 90 tablet 1  . sildenafil (VIAGRA) 100 MG tablet Take 0.5-1 tablets (50-100 mg total) by mouth daily as needed for erectile dysfunction. 5 tablet 11  . traZODone (DESYREL) 100 MG tablet TAKE 1 TABLET BY MOUTH EVERYDAY AT BEDTIME 90 tablet 0  . valACYclovir (VALTREX) 1000 MG tablet TAKE 1 TABLET BY MOUTH TWICE A DAY AS NEEDED 30 tablet 0   No current facility-administered medications on file prior to visit.   No Known Allergies Social History   Socioeconomic History  . Marital status: Single    Spouse name: Not on file  . Number of children: 0  . Years of education: Not on file  . Highest education level: Bachelor's degree (e.g., BA, AB, BS)  Occupational History  . Occupation: works in garden department    Comment: [employer]   Tobacco Use  . Smoking status: Former Smoker    Packs/day: 0.50    Years: 3.00    Pack years: 1.50    Types: Cigarettes    Quit date: 2009    Years since quitting: 12.9  . Smokeless tobacco: Never Used  Vaping Use  . Vaping Use: Never used  Substance and Sexual Activity  . Alcohol use: Yes    Alcohol/week: 12.0 standard drinks    Types: 12 Cans of beer per week    Comment: week  . Drug use: Yes    Frequency: 7.0 times per week    Types: Marijuana  . Sexual activity: Not on file  Other Topics Concern  . Not on file  Social History Narrative   Lives at home alone   Right handed   Caffeine: 1 cup each morning    Social Determinants of Health   Financial Resource Strain: Not on file  Food  Insecurity: Not on file  Transportation Needs: Not on file  Physical Activity: Not on file  Stress: Not on file  Social Connections: Not on file  Intimate Partner Violence: Not on file   Family History  Problem Relation Age of Onset  . Allergic rhinitis Mother   . Cancer Maternal Grandfather        bone and prostate     Review of Systems  All other systems reviewed and are negative.      Objective:   Physical Exam Vitals reviewed.  Constitutional:      General: He is not in acute distress.    Appearance: He is well-developed. He is not diaphoretic.  Cardiovascular:     Rate and Rhythm: Normal rate and regular rhythm.     Heart sounds: Normal heart sounds. No murmur heard. No friction rub. No gallop.   Pulmonary:     Effort: Pulmonary effort is normal. No respiratory distress.     Breath sounds: Normal breath sounds. No wheezing or rales.  Chest:     Chest wall: No tenderness.  Neurological:     Motor: No weakness.     Coordination: Coordination normal.     Gait: Gait normal.     Deep Tendon Reflexes: Reflexes normal.  Psychiatric:        Speech: Speech normal.  Behavior: Behavior normal.        Thought Content: Thought content normal. Thought content is not paranoid or delusional. Thought content does not include homicidal or suicidal ideation. Thought content does not include homicidal or suicidal plan.        Judgment: Judgment normal.           Assessment & Plan:  ADD (attention deficit disorder) without hyperactivity  Severe major depression without psychotic features (HCC)  Other specified hypothyroidism - Plan: TSH, T4, free, T3, Free  Overall, the patient is doing relatively well.  Therefore I recommended not make any changes in his medication at this time.  Compared to where he was earlier this sprain TMs much improved.  At the present time we have decided not to increase his stimulant further.  We will wait and see what this stabilizes.  We  did discuss adding an adjunctive therapy such as Intuniv however I am concerned about medication interactions and side effects.  Therefore we have decided to leave well enough alone at the present time.  I will repeat his thyroid test while he is here given the fact that they have been slightly off in the past

## 2020-02-18 LAB — T4, FREE: Free T4: 1.1 ng/dL (ref 0.8–1.8)

## 2020-02-18 LAB — T3, FREE: T3, Free: 3.3 pg/mL (ref 2.3–4.2)

## 2020-02-18 LAB — TSH: TSH: 1.22 mIU/L (ref 0.40–4.50)

## 2020-03-08 ENCOUNTER — Other Ambulatory Visit: Payer: Self-pay

## 2020-03-08 ENCOUNTER — Ambulatory Visit: Payer: BC Managed Care – PPO | Attending: Neurology

## 2020-03-08 DIAGNOSIS — M79605 Pain in left leg: Secondary | ICD-10-CM | POA: Diagnosis not present

## 2020-03-08 DIAGNOSIS — M6281 Muscle weakness (generalized): Secondary | ICD-10-CM

## 2020-03-08 DIAGNOSIS — M5442 Lumbago with sciatica, left side: Secondary | ICD-10-CM | POA: Diagnosis not present

## 2020-03-08 DIAGNOSIS — R262 Difficulty in walking, not elsewhere classified: Secondary | ICD-10-CM | POA: Diagnosis not present

## 2020-03-08 DIAGNOSIS — G8929 Other chronic pain: Secondary | ICD-10-CM | POA: Diagnosis not present

## 2020-03-09 ENCOUNTER — Ambulatory Visit: Payer: BC Managed Care – PPO | Admitting: Family Medicine

## 2020-03-09 NOTE — Therapy (Signed)
[place], [place], [address] Phone: [phone]   Fax:  [phone]  Physical Therapy Treatment  Patient Details  Name: Terry [name] MRN: [id number] Date of Birth: Jan 01, 1986 Referring Provider (PT): Melvenia Beam, MD    Encounter Date: 03/08/2020   PT End of Session - 03/08/20 1755    Visit Number 7    Number of Visits 12    Date for PT Re-Evaluation 03/25/20    Authorization Type Blue Cross Blue Shield - FOTO at visit 10    PT Start Time 1749    PT Stop Time 1830    PT Time Calculation (min) 41 min    Activity Tolerance Patient tolerated treatment well    Behavior During Therapy [facility] for tasks assessed/performed           Past Medical History:  Diagnosis Date  . Acute appendicitis 02/08/2013  . ADHD (attention deficit hyperactivity disorder)   . Allergic rhinitis   . Appendicitis, acute 02/08/2013  . Depression    since 8th grade, hospitalized 38 ([place])  . HSV infection     Past Surgical History:  Procedure Laterality Date  . APPENDECTOMY     2013  . LAPAROSCOPIC APPENDECTOMY N/A 02/08/2013   Procedure: APPENDECTOMY LAPAROSCOPIC;  Surgeon: Earnstine Regal, MD;  Location: [facility];  Service: General;  Laterality: N/A;  . WISDOM TOOTH EXTRACTION      There were no vitals filed for this visit.   Subjective Assessment - 03/09/20 0231    Subjective Patient reports he can tell his tightness has lessened overall but feels like he is starting to feel the "ice pick" feeling again in L upper leg due to prolonged standing on concrete at work and repetitive bending and lifting.    Pertinent History depression    Limitations Standing    How long can you sit comfortably? "it just depends if I get into certain positions that make it worse, but it's random"    How long can you stand comfortably? difficult to predict but prolonged standing (like at work) aggravates symptoms    How long can you  walk comfortably? "walking usually helps loosen my back up when it's stiff in the morning"    Patient Stated Goals being able to stand for a longer time without pain and do work activities without pain worsening    Currently in Pain? Yes    Pain Score 3     Pain Location Back   to LLE   Pain Orientation Left;Lower    Pain Descriptors / Indicators Aching;Throbbing    Pain Type Chronic pain    Pain Onset More than a month ago              [facility] PT Assessment - 03/09/20 0001      Assessment   Medical Diagnosis Left leg weakness R29.898, Lumbosacral radiculopathy at L5 M54.17, Left leg pain M79.605, Chronic left-sided low back pain with left-sided sciatica M54.42, G89.29    Referring Provider (PT) Melvenia Beam, MD                          [facility] Adult PT Treatment/Exercise - 03/09/20 0001      Self-Care   Self-Care Other Self-Care Comments    Other Self-Care Comments  Reviewed HEP, body mechanics with bending/lifting at work      Lumbar Exercises: Stretches   Single Knee to Chest Stretch Left;2  reps;30 seconds    Other Lumbar Stretch Exercise LLE Thomas stretch 2 x 30 sec    Other Lumbar Stretch Exercise Doorway L QL stretch 3 x 30 sec      Lumbar Exercises: Aerobic   Nustep L6 x 5 min      Lumbar Exercises: Standing   Functional Squats 20 reps    Functional Squats Limitations chair taps with cues for form    Other Standing Lumbar Exercises Deadlifts with 15# x 20 with cues for form and core activation    Other Standing Lumbar Exercises Hip hinge with dowel x 20      Knee/Hip Exercises: Stretches   Passive Hamstring Stretch Left;3 reps;30 seconds    Passive Hamstring Stretch Limitations seated at EOM    ITB Stretch Left;Other (comment)   90 seconds   ITB Stretch Limitations with green strap      Manual Therapy   Manual Therapy Joint mobilization;Soft tissue mobilization;Myofascial release;Muscle Energy Technique    Manual therapy comments TTP along L  PSIS and low back musculature    Joint Mobilization Grades I-IV thoracolumbar and lumbar PA joint mobilizations at [facility] and TP    Soft tissue mobilization STM and myofascial release along lumbar and thoracic paraspinals    Muscle Energy Technique Long sit test revealed anterior rotation of L innominate from supine to longsitting that was decreased following MET (L hip EXT and R hip FL) 8 x 5 sec each                  PT Education - 03/09/20 0226    Education Details Reviewed HEP, body mechanics with bending/lifting at work    [employer]) Educated Patient    Methods Explanation    Comprehension Verbalized understanding            PT Short Term Goals - 02/09/20 1950      PT SHORT TERM GOAL #1   Title Pt will be independent with initial HEP.    Baseline Pt given initial HEP during evaluation 12/28/2019.    Time 3    Period Weeks    Status Achieved    Target Date 01/18/20      PT SHORT TERM GOAL #2   Title Pt will have 25% decreased pain upon waking up in the morning.    Baseline Pt with increased pain and stiffness in the morning. Updated 01/26/2020: pt reports persistent but reduced symptoms than during evaluation    Time 3    Period Weeks    Status Achieved    Target Date 01/18/20      PT SHORT TERM GOAL #3   Title Pt's FOTO score will improve from 26% limited to 23% limited or better.    Baseline 26% limited; predicted 22% limited. 02/09/2020: 28% limited; predicted 22% limited    Time 3    Period Weeks    Status On-going    Target Date 01/18/20             PT Long Term Goals - 02/09/20 1951      PT LONG TERM GOAL #1   Title Pt will be independent with advanced HEP.    Baseline Pt given intial HEP at evaluation 12/28/2019.    Time 6    Period Weeks    Status On-going      PT LONG TERM GOAL #2   Title Pt will improve L hip FL MMT to 4/5 or greater.    Baseline 3+/5. 02/09/2020: 4+/5  Time 6    Period Weeks    Status Achieved      PT LONG TERM GOAL #3    Title Pt's FOTO score will improve from 26% limited to 20% limited or better.    Baseline 26% limited; predicted 22% limited. 02/09/2020: 28% limited; predicted 22% limited    Time 6    Period Weeks    Status On-going                 Plan - 03/09/20 0233    Clinical Impression Statement Patient presents to clinic for the first time since 02/09/2020 due to the holidays and scheduling conflicts. He tolerated treatment session well with focus on body mechanics with hip hinge, squatting, and lifting today to allow for pain reduction with patient's work tasks. He expresses interest in TPDN and was advised to call to inquire about same day availability to allow for earlier appointment or to be seen by a TPDN certified PT for next visit.    Personal Factors and Comorbidities Comorbidity 1;Time since onset of injury/illness/exacerbation    Comorbidities depression    Examination-Activity Limitations Stand;Stairs;Bend;Squat;Lift;Sleep;Other   pain with prolonged standing/activity; stiffness after sleeping   Examination-Participation Restrictions Community Activity    Stability/Clinical Decision Making Evolving/Moderate complexity    Rehab Potential Good    PT Frequency 1x / week    PT Duration 6 weeks    PT Treatment/Interventions ADLs/Self Care Home Management;Cryotherapy;Electrical Stimulation;Iontophoresis 25m/ml Dexamethasone;Moist Heat;Traction;Gait training;Stair training;Functional mobility training;Therapeutic activities;Therapeutic exercise;Balance training;Neuromuscular re-education;Patient/family education;Manual techniques;Passive range of motion;Taping    PT Next Visit Plan Review and update HEP. Progress core/LE strengthening interventions, STM PRN and stretches for ITB, hip flexors, and thoracic/lumbar paraspinals. Body mechanics and core engagement with activities - hip hinge, deadlifts, squats    PT Home Exercise Plan [id number] - POE, prone press ups, piriformis stretch, straight  leg raise, cat/camel, ITB and Thomas stretches, sidelying hip ABD, PPT with marches, dead bugs, lateral stepping and monster walks    Consulted and Agree with Plan of Care Patient           Patient will benefit from skilled therapeutic intervention in order to improve the following deficits and impairments:  Decreased activity tolerance,Decreased strength,Decreased endurance,Pain  Visit Diagnosis: Chronic left-sided low back pain with left-sided sciatica  Pain in left leg  Muscle weakness (generalized)  Difficulty in walking, not elsewhere classified     Problem List Patient Active Problem List   Diagnosis Date Noted  . Chronic left-sided low back pain with left-sided sciatica 12/01/2019  . Genital herpes simplex 09/24/2018  . Overweight (BMI 25.0-29.9) 09/24/2018  . Bipolar affective disorder, current episode depressed ([facility] 09/24/2018  . Generalized anxiety disorder 09/24/2018  . Social anxiety disorder 09/24/2018  . Insomnia 09/24/2018  . Recurrent major depression resistant to treatment ([place] 09/24/2018  . ADD (attention deficit disorder) without hyperactivity 09/24/2018  . Elevated liver enzymes 09/24/2018  . Drug-induced erectile dysfunction 09/24/2018  . ATTENTION DEFICIT HYPERACTIVITY DISORDER 03/04/2007  . Seasonal and perennial allergic rhinitis 03/04/2007     KHaydee Monica PT, DPT 03/09/20 2:43 AM   [facility] [address]Phone: [phone]  Fax:  [phone] Name: BKAYVEON LENNARTZMRN: [id number] of Birth: 1June 28, 1987

## 2020-03-15 ENCOUNTER — Encounter: Payer: Self-pay | Admitting: Family Medicine

## 2020-03-15 ENCOUNTER — Ambulatory Visit: Payer: BC Managed Care – PPO

## 2020-03-15 NOTE — Telephone Encounter (Signed)
Ok to refill??  Last office visit 02/17/2020.  Last refill 02/15/2020.

## 2020-03-16 MED ORDER — LISDEXAMFETAMINE DIMESYLATE 50 MG PO CAPS
50.0000 mg | ORAL_CAPSULE | Freq: Every day | ORAL | 0 refills | Status: DC
Start: 2020-03-16 — End: 2020-03-30

## 2020-03-22 ENCOUNTER — Ambulatory Visit: Payer: BC Managed Care – PPO

## 2020-03-25 ENCOUNTER — Encounter: Payer: Self-pay | Admitting: Family Medicine

## 2020-03-27 ENCOUNTER — Telehealth: Payer: Self-pay | Admitting: *Deleted

## 2020-03-27 NOTE — Telephone Encounter (Signed)
Received request from pharmacy for PA on Vyvanse.  PA submitted.   Dx: M42.6- ADD.   Your information has been submitted to [facility] [place]. Blue Cross [place] will review the request and notify you of the determination decision directly, typically within 72 hours of receiving all information.  If [employer] [place] has not responded within the specified timeframe or if you have any questions about your PA submission, contact Blue Cross [place] directly at [phone].

## 2020-03-29 NOTE — Telephone Encounter (Signed)
Received PA determination. PA denied.   This medication is not on the formulary and is covered only when all alternative medications have been tried and failed.  Alternative medications include: Amphetamine/ Dextroamphetamine XR (Adderall XR) Dexmethylphenidate ER (Focalin XR, Dexedrine) Methylphenidate ER (Concerta, Metadate, Ritalin)  Please advise.

## 2020-03-30 ENCOUNTER — Other Ambulatory Visit: Payer: Self-pay | Admitting: Family Medicine

## 2020-03-30 MED ORDER — AMPHETAMINE-DEXTROAMPHET ER 30 MG PO CP24: 30.0000 mg | ORAL_CAPSULE | ORAL | 0 refills | Status: DC

## 2020-03-30 NOTE — Telephone Encounter (Signed)
Switch vuvanse to adderall xr 30 mg poqam.

## 2020-03-30 NOTE — Telephone Encounter (Signed)
Patient aware per MyChart.   

## 2020-04-05 ENCOUNTER — Ambulatory Visit: Payer: BC Managed Care – PPO | Attending: Neurology

## 2020-04-05 ENCOUNTER — Other Ambulatory Visit: Payer: Self-pay | Admitting: Family Medicine

## 2020-04-05 ENCOUNTER — Other Ambulatory Visit: Payer: Self-pay

## 2020-04-05 DIAGNOSIS — M6281 Muscle weakness (generalized): Secondary | ICD-10-CM | POA: Diagnosis not present

## 2020-04-05 DIAGNOSIS — M79605 Pain in left leg: Secondary | ICD-10-CM | POA: Diagnosis not present

## 2020-04-05 DIAGNOSIS — G8929 Other chronic pain: Secondary | ICD-10-CM

## 2020-04-05 DIAGNOSIS — M5442 Lumbago with sciatica, left side: Secondary | ICD-10-CM | POA: Diagnosis not present

## 2020-04-05 DIAGNOSIS — R262 Difficulty in walking, not elsewhere classified: Secondary | ICD-10-CM | POA: Diagnosis not present

## 2020-04-05 NOTE — Therapy (Signed)
[facility] [address] [place], [place], [address] Phone: [phone]   Fax:  [phone]  Physical Therapy Treatment/Re-certification   Patient Details  Name: Terry [name] MRN: [id number] Date of Birth: 1985/06/27 Referring Provider (PT): Anson Fret, MD    Encounter Date: 04/05/2020   PT End of Session - 04/05/20 1737    Visit Number 8    Number of Visits 14    Date for PT Re-Evaluation 05/20/20    Authorization Type Blue Cross Blue Shield - FOTO at visit 10    PT Start Time 1719    PT Stop Time 1800    PT Time Calculation (min) 41 min    Activity Tolerance Patient tolerated treatment well    Behavior During Therapy [facility] for tasks assessed/performed           Past Medical History:  Diagnosis Date  . Acute appendicitis 02/08/2013  . ADHD (attention deficit hyperactivity disorder)   . Allergic rhinitis   . Appendicitis, acute 02/08/2013  . Depression    since 8th grade, hospitalized 61 ([place])  . HSV infection     Past Surgical History:  Procedure Laterality Date  . APPENDECTOMY     2013  . LAPAROSCOPIC APPENDECTOMY N/A 02/08/2013   Procedure: APPENDECTOMY LAPAROSCOPIC;  Surgeon: Velora Heckler, MD;  Location: [facility];  Service: General;  Laterality: N/A;  . WISDOM TOOTH EXTRACTION      There were no vitals filed for this visit.   Subjective Assessment - 04/05/20 1720    Subjective "Some days are better than others, but it's bothering me on the Lt side right now."    Pertinent History depression    Limitations Standing    How long can you sit comfortably? "sitting isn't normally an issue"    How long can you stand comfortably? "5-10 minutes"    How long can you walk comfortably? "5-10 minutes"    Patient Stated Goals being able to stand for a longer time without pain and do work activities without pain worsening    Currently in Pain? Yes    Pain Score 4     Pain Location Back    Pain Orientation  Left;Lower    Pain Descriptors / Indicators Throbbing;Stabbing    Pain Onset More than a month ago    Pain Frequency Constant              [facility] PT Assessment - 04/05/20 0001      Observation/Other Assessments   Focus on Therapeutic Outcomes (FOTO)  65% function      AROM   Lumbar Flexion WFL "feels better"    Lumbar Extension WFL "pressure low back"    Lumbar - Right Side Bend WFL    Lumbar - Left Side Bend WFL    Lumbar - Right Rotation WFL    Lumbar - Left Rotation WFL (more limited compared to Rt)      Strength   Right Hip Flexion 5/5    Right Hip Extension 4+/5    Right Hip ABduction 5/5    Left Hip Flexion 5/5    Left Hip Extension 4/5    Left Hip ABduction 4+/5    Right Knee Flexion 5/5    Right Knee Extension 5/5    Left Knee Flexion 5/5    Left Knee Extension 5/5    Right Ankle Dorsiflexion 5/5    Right Ankle Plantar Flexion 5/5    Left Ankle Dorsiflexion 5/5  Left Ankle Plantar Flexion 5/5      Flexibility   Hamstrings 25% limitaiton Lt; RLE WNL      Palpation   Palpation comment Mild pain reported with Lt UPAs L2-3 and CPAs L4-5.      Special Tests   Other special tests (-) SLR (-) FABER                         [facility] Adult PT Treatment/Exercise - 04/05/20 0001      Self-Care   Other Self-Care Comments  see patient education      Lumbar Exercises: Stretches   Passive Hamstring Stretch Limitations 60 sec each    Figure 4 Stretch Limitations 60 sec each      Lumbar Exercises: Standing   Other Standing Lumbar Exercises Hip hinge with dowel 2 x 15      Lumbar Exercises: Seated   Other Seated Lumbar Exercises pelvic tilts on stability ball 2 x 10      Lumbar Exercises: Quadruped   Other Quadruped Lumbar Exercises bird dog LE only 1 x 10      Knee/Hip Exercises: Seated   Marching 2 sets;10 reps    Marching Limitations on stability ball                  PT Education - 04/05/20 1748    Education Details Education on  overall progress and POC moving foward. review of HEP.    Person(s) Educated Patient    Methods Explanation;Handout    Comprehension Verbalized understanding            PT Short Term Goals - 04/05/20 1752      PT SHORT TERM GOAL #1   Title Pt will be independent with initial HEP.    Baseline Pt given initial HEP during evaluation 12/28/2019.    Time 3    Period Weeks    Status Achieved    Target Date 01/18/20      PT SHORT TERM GOAL #2   Title Pt will have 25% decreased pain upon waking up in the morning.    Baseline reduced pain compared to initial evaluation.    Time 3    Period Weeks    Status Achieved    Target Date 01/18/20      PT SHORT TERM GOAL #3   Title Pt's FOTO score will improve from 26% limited to 23% limited or better.    Baseline see flowsheet    Time 3    Period Weeks    Status On-going    Target Date 01/18/20             PT Long Term Goals - 04/05/20 1753      PT LONG TERM GOAL #1   Title Pt will be independent with advanced HEP.    Baseline Pt given intial HEP at evaluation 12/28/2019.    Time 6    Period Weeks    Status On-going      PT LONG TERM GOAL #2   Title Pt will improve L hip FL MMT to 4/5 or greater.    Baseline see flowsheet    Time 6    Period Weeks    Status Achieved      PT LONG TERM GOAL #3   Title Pt's FOTO score will improve from 26% limited to 20% limited or better.    Baseline see flowsheet    Time 6    Period  Weeks    Status On-going                 Plan - 04/05/20 1742    Clinical Impression Statement Patient has attended one session since previous re-evaluation on 02/09/2020 due to scheduling conflicts and COVID exposure reporting subjective improvements in his overall back pain since last session. He continues to report moderate pain levels with prolonged standing and walking activity more notable when he is performing work while standing on concrete as opposed to softer surfaces. His Lt hip strength  remains unchanged since previous re-evaluation, though this is likely due in part to lack of formal PT since last assessed. Patient has full and pain free lumbar AROM, though has slightly less Lt lumbar rotation compared to the Rt. He tolerated progression of dynamic core stablization well, though has notable weakness in his core having difficulty maintaining trunk/pelvic stability during strengthening exercises. He should benefit from consistent skilled PT to further increase lumbar mobility and Lt hip/core strength to allow for improved tolerance to prolonged standing and walking activity necessary for his work specific duties.    Personal Factors and Comorbidities Comorbidity 1;Time since onset of injury/illness/exacerbation    Comorbidities depression    Examination-Activity Limitations Stand;Stairs;Bend;Squat;Lift;Sleep;Other   pain with prolonged standing/activity; stiffness after sleeping   Examination-Participation Restrictions Community Activity    Stability/Clinical Decision Making Evolving/Moderate complexity    Rehab Potential Good    PT Frequency 1x / week    PT Duration 6 weeks    PT Treatment/Interventions ADLs/Self Care Home Management;Cryotherapy;Electrical Stimulation;Iontophoresis 4mg /ml Dexamethasone;Moist Heat;Traction;Gait training;Stair training;Functional mobility training;Therapeutic activities;Therapeutic exercise;Balance training;Neuromuscular re-education;Patient/family education;Manual techniques;Passive range of motion;Taping    PT Next Visit Plan Review and update HEP. Progress core/LE strengthening interventions, STM PRN and stretches for ITB, hip flexors, and thoracic/lumbar paraspinals. Body mechanics and core engagement with activities - hip hinge, deadlifts, squats    PT Home Exercise Plan [id number] - POE, prone press ups, piriformis stretch, straight leg raise, cat/camel, ITB and Thomas stretches, sidelying hip ABD, PPT with marches, dead bugs, lateral stepping and  monster walks    Consulted and Agree with Plan of Care Patient           Patient will benefit from skilled therapeutic intervention in order to improve the following deficits and impairments:  Decreased activity tolerance,Decreased strength,Decreased endurance,Pain  Visit Diagnosis: Chronic left-sided low back pain with left-sided sciatica  Pain in left leg  Muscle weakness (generalized)  Difficulty in walking, not elsewhere classified     Problem List Patient Active Problem List   Diagnosis Date Noted  . Chronic left-sided low back pain with left-sided sciatica 12/01/2019  . Genital herpes simplex 09/24/2018  . Overweight (BMI 25.0-29.9) 09/24/2018  . Bipolar affective disorder, current episode depressed (H[facility]) 09/24/2018  . Generalized anxiety disorder 09/24/2018  . Social anxiety disorder 09/24/2018  . Insomnia 09/24/2018  . Recurrent major depression resistant to treatment (H[facility]) 09/24/2018  . ADD (attention deficit disorder) without hyperactivity 09/24/2018  . Elevated liver enzymes 09/24/2018  . Drug-induced erectile dysfunction 09/24/2018  . ATTENTION DEFICIT HYPERACTIVITY DISORDER 03/04/2007  . Seasonal and perennial allergic rhinitis 03/04/2007   05/02/2007, PT, DPT, ATC 04/05/20 9:10 PM  [facility] [address] [place], [place], [place] Phone: [phone]   Fax:  [phone]  Name: Terry [name] MRN: [id number] Date of Birth: Aug 16, 1985

## 2020-04-11 ENCOUNTER — Other Ambulatory Visit: Payer: Self-pay

## 2020-04-11 ENCOUNTER — Encounter: Payer: Self-pay | Admitting: Physical Therapy

## 2020-04-11 ENCOUNTER — Ambulatory Visit: Payer: BC Managed Care – PPO | Admitting: Physical Therapy

## 2020-04-11 DIAGNOSIS — M6281 Muscle weakness (generalized): Secondary | ICD-10-CM | POA: Diagnosis not present

## 2020-04-11 DIAGNOSIS — R262 Difficulty in walking, not elsewhere classified: Secondary | ICD-10-CM | POA: Diagnosis not present

## 2020-04-11 DIAGNOSIS — M79605 Pain in left leg: Secondary | ICD-10-CM

## 2020-04-11 DIAGNOSIS — G8929 Other chronic pain: Secondary | ICD-10-CM

## 2020-04-11 DIAGNOSIS — M5442 Lumbago with sciatica, left side: Secondary | ICD-10-CM | POA: Diagnosis not present

## 2020-04-11 NOTE — Therapy (Signed)
[facility] [address] [place], [place], [address] Phone: [phone]   Fax:  [phone]  Physical Therapy Treatment  Patient Details  Name: Terry [name] MRN: [id number] Date of Birth: 01/14/1986 Referring Provider (PT): Anson Fret, MD    Encounter Date: 04/11/2020   PT End of Session - 04/11/20 1632    Visit Number 9    Number of Visits 14    Date for PT Re-Evaluation 05/20/20    Authorization Type [employer] - FOTO at visit 10    PT Start Time 1633    PT Stop Time 1713    PT Time Calculation (min) 40 min    Activity Tolerance Patient tolerated treatment well    Behavior During Therapy [facility] for tasks assessed/performed           Past Medical History:  Diagnosis Date  . Acute appendicitis 02/08/2013  . ADHD (attention deficit hyperactivity disorder)   . Allergic rhinitis   . Appendicitis, acute 02/08/2013  . Depression    since 8th grade, hospitalized 27 ([place])  . HSV infection     Past Surgical History:  Procedure Laterality Date  . APPENDECTOMY     2013  . LAPAROSCOPIC APPENDECTOMY N/A 02/08/2013   Procedure: APPENDECTOMY LAPAROSCOPIC;  Surgeon: Velora Heckler, MD;  Location: [facility];  Service: General;  Laterality: N/A;  . WISDOM TOOTH EXTRACTION      There were no vitals filed for this visit.   Subjective Assessment - 04/11/20 1633    Subjective "I feel like I am doing a little better. I did get a new pair of work shoes and I've noticed being a litle sore."    Patient Stated Goals being able to stand for a longer time without pain and do work activities without pain worsening    Currently in Pain? Yes    Pain Score 4     Pain Location Back    Pain Orientation Left;Lower    Pain Descriptors / Indicators Aching;Sore    Aggravating Factors  standing/ walking and lifting    Pain Relieving Factors sitting/ laying down and resing              [facility] PT Assessment - 04/11/20 0001       Assessment   Medical Diagnosis Left leg weakness R29.898, Lumbosacral radiculopathy at L5 M54.17, Left leg pain M79.605, Chronic left-sided low back pain with left-sided sciatica M54.42, G89.29    Referring Provider (PT) Anson Fret, MD                          [facility] Adult PT Treatment/Exercise - 04/11/20 0001      Lumbar Exercises: Supine   Dead Bug 10 reps   holding 10 seconds x 2 sets     Knee/Hip Exercises: Seated   Abduction/Adduction  Strengthening;Both;2 sets;10 reps      Knee/Hip Exercises: Sidelying   Hip ABduction 2 sets;15 reps;Left;Strengthening      Manual Therapy   Manual therapy comments skilled palpation and monitoring of pt throughout TPDN    Soft tissue mobilization IASTM along glute med on L            Trigger Point Dry Needling - 04/11/20 0001    Consent Given? Yes    Muscles Treated Back/Hip Gluteus medius    Gluteus Medius Response Twitch response elicited;Palpable increased muscle length   L hp  PT Education - 04/11/20 1718    Education Details Reviewed HEp and updated today. reviewed anatomy of SIJ and effects of surrounding musculature    Person(s) Educated Patient    Methods Explanation;Verbal cues;Handout    Comprehension Verbalized understanding;Verbal cues required            PT Short Term Goals - 04/05/20 1752      PT SHORT TERM GOAL #1   Title Pt will be independent with initial HEP.    Baseline Pt given initial HEP during evaluation 12/28/2019.    Time 3    Period Weeks    Status Achieved    Target Date 01/18/20      PT SHORT TERM GOAL #2   Title Pt will have 25% decreased pain upon waking up in the morning.    Baseline reduced pain compared to initial evaluation.    Time 3    Period Weeks    Status Achieved    Target Date 01/18/20      PT SHORT TERM GOAL #3   Title Pt's FOTO score will improve from 26% limited to 23% limited or better.    Baseline see flowsheet    Time 3     Period Weeks    Status On-going    Target Date 01/18/20             PT Long Term Goals - 04/05/20 1753      PT LONG TERM GOAL #1   Title Pt will be independent with advanced HEP.    Baseline Pt given intial HEP at evaluation 12/28/2019.    Time 6    Period Weeks    Status On-going      PT LONG TERM GOAL #2   Title Pt will improve L hip FL MMT to 4/5 or greater.    Baseline see flowsheet    Time 6    Period Weeks    Status Achieved      PT LONG TERM GOAL #3   Title Pt's FOTO score will improve from 26% limited to 20% limited or better.    Baseline see flowsheet    Time 6    Period Weeks    Status On-going                 Plan - 04/11/20 1712    Clinical Impression Statement pt reports he has been doing well since starting PT but notes contiued L low back pain with standing / walking. further assessment revealed potential SIJ inflare. TPDN was performed on the L glute med and worked on general hip strengtheing with emphasis on the hip abductors and adductors. continued working on core strengtheing and updated today to promote stability. Following session he reported decreased stiffness and pain with standing/walking and bending forward.    PT Treatment/Interventions ADLs/Self Care Home Management;Cryotherapy;Electrical Stimulation;Iontophoresis 4mg /ml Dexamethasone;Moist Heat;Traction;Gait training;Stair training;Functional mobility training;Therapeutic activities;Therapeutic exercise;Balance training;Neuromuscular re-education;Patient/family education;Manual techniques;Passive range of motion;Taping    PT Next Visit Plan Review and update HEP. potential SIJ inflare - Progress core/LE and glute med/ adductor strengthening focus [name] on the L, continued TPDN PRN for glute med/ lumbar paraspinals, and thoracic/lumbar paraspinals. Body mechanics and core engagement with activities - hip hinge, deadlifts, squats    PT Home Exercise Plan [id number] - POE, prone press ups,  piriformis stretch, straight leg raise, cat/camel, ITB and Thomas stretches, sidelying hip ABD, PPT with marches, dead bugs, lateral stepping and monster walks    Consulted and Agree with  Plan of Care Patient           Patient will benefit from skilled therapeutic intervention in order to improve the following deficits and impairments:  Decreased activity tolerance,Decreased strength,Decreased endurance,Pain  Visit Diagnosis: Chronic left-sided low back pain with left-sided sciatica  Pain in left leg  Muscle weakness (generalized)  Difficulty in walking, not elsewhere classified     Problem List Patient Active Problem List   Diagnosis Date Noted  . Chronic left-sided low back pain with left-sided sciatica 12/01/2019  . Genital herpes simplex 09/24/2018  . Overweight (BMI 25.0-29.9) 09/24/2018  . Bipolar affective disorder, current episode depressed (HCC) 09/24/2018  . Generalized anxiety disorder 09/24/2018  . Social anxiety disorder 09/24/2018  . Insomnia 09/24/2018  . Recurrent major depression resistant to treatment (HCC) 09/24/2018  . ADD (attention deficit disorder) without hyperactivity 09/24/2018  . Elevated liver enzymes 09/24/2018  . Drug-induced erectile dysfunction 09/24/2018  . ATTENTION DEFICIT HYPERACTIVITY DISORDER 03/04/2007  . Seasonal and perennial allergic rhinitis 03/04/2007    Lulu Riding PT, DPT, LAT, ATC  04/11/20  5:19 PM      [facility] [address] [place], [place], [address] Phone: [phone]   Fax:  [phone]  Name: Terry [name] MRN: [id number] Date of Birth: 1985/04/29

## 2020-04-25 ENCOUNTER — Ambulatory Visit: Payer: BC Managed Care – PPO | Admitting: Physical Therapy

## 2020-05-03 ENCOUNTER — Ambulatory Visit: Payer: BC Managed Care – PPO | Attending: Neurology

## 2020-05-03 ENCOUNTER — Other Ambulatory Visit: Payer: Self-pay

## 2020-05-03 DIAGNOSIS — M6281 Muscle weakness (generalized): Secondary | ICD-10-CM | POA: Diagnosis not present

## 2020-05-03 DIAGNOSIS — M79605 Pain in left leg: Secondary | ICD-10-CM | POA: Insufficient documentation

## 2020-05-03 DIAGNOSIS — M5442 Lumbago with sciatica, left side: Secondary | ICD-10-CM | POA: Insufficient documentation

## 2020-05-03 DIAGNOSIS — G8929 Other chronic pain: Secondary | ICD-10-CM | POA: Insufficient documentation

## 2020-05-03 DIAGNOSIS — R262 Difficulty in walking, not elsewhere classified: Secondary | ICD-10-CM | POA: Diagnosis not present

## 2020-05-03 NOTE — Therapy (Signed)
[facility] [address] [place], [place], [address] Phone: [phone]   Fax:  [phone]  Physical Therapy Treatment  Patient Details  Name: Terry [name] MRN: [id number] Date of Birth: 1985/05/01 Referring Provider (PT): Anson Fret, MD    Encounter Date: 05/03/2020   PT End of Session - 05/03/20 1827    Visit Number 10    Number of Visits 14    Date for PT Re-Evaluation 05/20/20    Authorization Type [employer] Cross [place]    PT Start Time [phone]    PT Stop Time 1915    PT Time Calculation (min) 45 min    Activity Tolerance Patient tolerated treatment well    Behavior During Therapy [facility] for tasks assessed/performed           Past Medical History:  Diagnosis Date  . Acute appendicitis 02/08/2013  . ADHD (attention deficit hyperactivity disorder)   . Allergic rhinitis   . Appendicitis, acute 02/08/2013  . Depression    since 8th grade, hospitalized 43 ([place])  . HSV infection     Past Surgical History:  Procedure Laterality Date  . APPENDECTOMY     2013  . LAPAROSCOPIC APPENDECTOMY N/A 02/08/2013   Procedure: APPENDECTOMY LAPAROSCOPIC;  Surgeon: Velora Heckler, MD;  Location: [facility];  Service: General;  Laterality: N/A;  . WISDOM TOOTH EXTRACTION      There were no vitals filed for this visit.   Subjective Assessment - 05/03/20 1827    Subjective "I have been outside a little more at work, but it has been a lot more of lifting and picking things up since inventory is coming in. It's been pretty good today except some burning on the side of my left thigh/leg."    Pertinent History depression    Limitations Standing    How long can you sit comfortably? "sitting isn't normally an issue"    How long can you stand comfortably? "5-10 minutes"    How long can you walk comfortably? "5-10 minutes"    Patient Stated Goals being able to stand for a longer time without pain and do work activities without pain  worsening    Currently in Pain? Yes    Pain Score 3     Pain Location Back    Pain Orientation Left;Lower    Pain Descriptors / Indicators Aching;Sore    Pain Type Chronic pain    Pain Onset More than a month ago              [facility] PT Assessment - 05/03/20 0001      Assessment   Medical Diagnosis Left leg weakness R29.898, Lumbosacral radiculopathy at L5 M54.17, Left leg pain M79.605, Chronic left-sided low back pain with left-sided sciatica M54.42, G89.29    Referring Provider (PT) Anson Fret, MD                          [facility] Adult PT Treatment/Exercise - 05/03/20 0001      Self-Care   Self-Care Other Self-Care Comments    Other Self-Care Comments  See patient education      Lumbar Exercises: Aerobic   Nustep L6 x 5 min LE only      Lumbar Exercises: Seated   Other Seated Lumbar Exercises seated on BOSU in [place] twist position while performing alternating marches x 10 each LE      Lumbar Exercises: Supine   Dead Bug  15 reps    Dead Bug Limitations with green swiss ball between static UE/LE      Lumbar Exercises: Quadruped   Other Quadruped Lumbar Exercises bird dog x 15 each UE/LE    Other Quadruped Lumbar Exercises fire hydrant x 20 each LE      Knee/Hip Exercises: Standing   Hip Abduction Stengthening;Both;20 reps;Knee straight    Abduction Limitations stance leg on airex    Other Standing Knee Exercises tandem half kneeling on airex then with perturbations AP/ML by PT then while performing horizontal ADD against green theraband x 10 each UE      Knee/Hip Exercises: Seated   Marching Both;15 reps    Marching Limitations on stability ball x 15 each LE    Abduction/Adduction  Strengthening;Both;20 reps    Abd/Adduction Limitations ball squeeze and seated clam with blue theraband      Knee/Hip Exercises: Sidelying   Hip ABduction 2 sets;15 reps;Left;Strengthening      Manual Therapy   Soft tissue mobilization IASTM, STM, and MFR along L  glute med. Roller along L ITB                  PT Education - 05/03/20 1934    Education Details Updated and reviewed HEP.    Person(s) Educated Patient    Methods Explanation;Demonstration;Verbal cues;Handout    Comprehension Verbalized understanding;Returned demonstration            PT Short Term Goals - 04/05/20 1752      PT SHORT TERM GOAL #1   Title Pt will be independent with initial HEP.    Baseline Pt given initial HEP during evaluation 12/28/2019.    Time 3    Period Weeks    Status Achieved    Target Date 01/18/20      PT SHORT TERM GOAL #2   Title Pt will have 25% decreased pain upon waking up in the morning.    Baseline reduced pain compared to initial evaluation.    Time 3    Period Weeks    Status Achieved    Target Date 01/18/20      PT SHORT TERM GOAL #3   Title Pt's FOTO score will improve from 26% limited to 23% limited or better.    Baseline see flowsheet    Time 3    Period Weeks    Status On-going    Target Date 01/18/20             PT Long Term Goals - 04/05/20 1753      PT LONG TERM GOAL #1   Title Pt will be independent with advanced HEP.    Baseline Pt given intial HEP at evaluation 12/28/2019.    Time 6    Period Weeks    Status On-going      PT LONG TERM GOAL #2   Title Pt will improve L hip FL MMT to 4/5 or greater.    Baseline see flowsheet    Time 6    Period Weeks    Status Achieved      PT LONG TERM GOAL #3   Title Pt's FOTO score will improve from 26% limited to 20% limited or better.    Baseline see flowsheet    Time 6    Period Weeks    Status On-going                 Plan - 05/03/20 1827    Clinical Impression Statement Patient  tolerated session well with fatigue during hip strengthening interventions and some pain in L lateral thigh following L tandem kneeling on airex. Continued to emphasis core and hip strengthening with focus on hip ADD/ABD. He notes that his pain has improved significantly  throughout POC thus far but has been aggravated with increased tasks at work recently.    PT Treatment/Interventions ADLs/Self Care Home Management;Cryotherapy;Electrical Stimulation;Iontophoresis 4mg /ml Dexamethasone;Moist Heat;Traction;Gait training;Stair training;Functional mobility training;Therapeutic activities;Therapeutic exercise;Balance training;Neuromuscular re-education;Patient/family education;Manual techniques;Passive range of motion;Taping    PT Next Visit Plan FOTO. Review and update HEP. potential SIJ inflare - Progress core/LE and glute med/ adductor strengthening focus more on the L, continued TPDN PRN for glute med/ lumbar paraspinals, and thoracic/lumbar paraspinals. Body mechanics and core engagement with activities - hip hinge, deadlifts, squats    PT Home Exercise Plan    Recommended Other Services recommended follow up with physician due to continued pain despite gradual progress with PT    Consulted and Agree with Plan of Care Patient           Patient will benefit from skilled therapeutic intervention in order to improve the following deficits and impairments:  Decreased activity tolerance,Decreased strength,Decreased endurance,Pain  Visit Diagnosis: Chronic left-sided low back pain with left-sided sciatica  Pain in left leg  Muscle weakness (generalized)  Difficulty in walking, not elsewhere classified     Problem List Patient Active Problem List   Diagnosis Date Noted  . Chronic left-sided low back pain with left-sided sciatica 12/01/2019  . Genital herpes simplex 09/24/2018  . Overweight (BMI 25.0-29.9) 09/24/2018  . Bipolar affective disorder, current episode depressed (HCC) 09/24/2018  . Generalized anxiety disorder 09/24/2018  . Social anxiety disorder 09/24/2018  . Insomnia 09/24/2018  . Recurrent major depression resistant to treatment (HCC) 09/24/2018  . ADD (attention deficit disorder) without hyperactivity 09/24/2018  . Elevated liver  enzymes 09/24/2018  . Drug-induced erectile dysfunction 09/24/2018  . ATTENTION DEFICIT HYPERACTIVITY DISORDER 03/04/2007  . Seasonal and perennial allergic rhinitis 03/04/2007     05/02/2007, PT, DPT 05/03/20 7:37 PM  [facility] [address] [place], [place], [place] Phone: [phone]   Fax:  [phone]  Name: Terry [name] MRN: [id number] Date of Birth: April 08, 1985

## 2020-05-08 ENCOUNTER — Encounter: Payer: Self-pay | Admitting: Family Medicine

## 2020-05-08 MED ORDER — LISDEXAMFETAMINE DIMESYLATE 40 MG PO CAPS: 40.0000 mg | ORAL_CAPSULE | ORAL | 0 refills | Status: DC

## 2020-05-08 NOTE — Telephone Encounter (Signed)
Ok to refill Vyvanse?  Last OV 02/17/2020.

## 2020-05-10 ENCOUNTER — Other Ambulatory Visit: Payer: Self-pay

## 2020-05-10 ENCOUNTER — Ambulatory Visit: Payer: BC Managed Care – PPO

## 2020-05-10 DIAGNOSIS — M6281 Muscle weakness (generalized): Secondary | ICD-10-CM | POA: Diagnosis not present

## 2020-05-10 DIAGNOSIS — M79605 Pain in left leg: Secondary | ICD-10-CM

## 2020-05-10 DIAGNOSIS — G8929 Other chronic pain: Secondary | ICD-10-CM

## 2020-05-10 DIAGNOSIS — R262 Difficulty in walking, not elsewhere classified: Secondary | ICD-10-CM | POA: Diagnosis not present

## 2020-05-10 DIAGNOSIS — M5442 Lumbago with sciatica, left side: Secondary | ICD-10-CM | POA: Diagnosis not present

## 2020-05-11 NOTE — Therapy (Signed)
[facility] [address] [place], [place], [address] Phone: [phone]   Fax:  [phone]  Physical Therapy Treatment  Patient Details  Name: Terry [name] MRN: [id number] Date of Birth: [birth year] Referring Provider (PT): Anson Fret, MD    Encounter Date: 05/10/2020   PT End of Session - 05/10/20 1746    Visit Number 11    Number of Visits 14    Date for PT Re-Evaluation 05/20/20    Authorization Type [employer] [place]    PT Start Time 1747   pt arrived late   PT Stop Time 1829    PT Time Calculation (min) 42 min    Activity Tolerance Patient tolerated treatment well    Behavior During Therapy [facility] for tasks assessed/performed           Past Medical History:  Diagnosis Date  . Acute appendicitis 02/08/2013  . ADHD (attention deficit hyperactivity disorder)   . Allergic rhinitis   . Appendicitis, acute 02/08/2013  . Depression    since 8th grade, hospitalized 20 ([place])  . HSV infection     Past Surgical History:  Procedure Laterality Date  . APPENDECTOMY     2013  . LAPAROSCOPIC APPENDECTOMY N/A 02/08/2013   Procedure: APPENDECTOMY LAPAROSCOPIC;  Surgeon: Velora Heckler, MD;  Location: [facility];  Service: General;  Laterality: N/A;  . WISDOM TOOTH EXTRACTION      There were no vitals filed for this visit.   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 It's been a long week. My shoulder is bothering me more probably from grabbing the plants. The burning in my left leg comes and goes throughout the day and is mostly there. It's about a 3/10. Today was super busy but still felt like it dragged on, so I feel like I could just fall asleep right now." Pt expresses that he still needs/plans to reach out to physician for follow up.    Pertinent History depression    Limitations Standing    How long can you sit comfortably?  "sitting isn't normally an issue"    How long can you stand comfortably? "5-10 minutes"    How long can you walk comfortably? "5-10 minutes"    Patient Stated Goals being able to stand for a longer time without pain and do work activities without pain worsening    Currently in Pain? Yes    Pain Score 3     Pain Location Leg   left lateral thigh   Pain Orientation Left;Upper;Lateral    Pain Descriptors / Indicators Burning    Pain Type Chronic pain    Pain Onset More than a month ago              [facility] PT Assessment - 05/10/20 0001      Assessment   Medical Diagnosis Left leg weakness R29.898, Lumbosacral radiculopathy at L5 M54.17, Left leg pain M79.605, Chronic left-sided low back pain with left-sided sciatica M54.42, G89.29    Referring Provider (PT) Anson Fret, MD       Palpation   Palpation comment TTP along bilateral PSIS (R > L)                         [facility] Adult PT Treatment/Exercise - 05/10/20 0001      Self-Care   Self-Care Other  Self-Care Comments    Other Self-Care Comments  See patient education      Lumbar Exercises: Stretches   Hip Flexor Stretch Right;Left;1 rep;Other (comment)   90 seconds     Lumbar Exercises: Aerobic   Nustep L5 x 5 min LE only      Lumbar Exercises: Standing   Other Standing Lumbar Exercises hip hinge with dowel x 20; deadlift with 45# KB x 15 cues for technique/form      Lumbar Exercises: Supine   Ab Set 20 reps    AB Set Limitations pressing UE into red swiss ball with cues for exhalation during exertion    Bent Knee Raise Limitations alternating marches while maintaining core activation/low back against mat x 20 each LE      Lumbar Exercises: Sidelying   Other Sidelying Lumbar Exercises open book rotation x 20 each direction. limitation noted with left upper trunk rotation that was improved when performing open book following manual therapy      Lumbar Exercises: Quadruped   Other Quadruped Lumbar  Exercises fire hydrant x 20 each LE      Knee/Hip Exercises: Stretches   ITB Stretch Left;1 rep;Other (comment)   90 seconds     Knee/Hip Exercises: Sidelying   Hip ABduction 2 sets;15 reps;Left;Strengthening      Manual Therapy   Joint Mobilization grades III-IV lumbar left rotation mobilization (patient in right sidelying with left upper trunk rotation)                  PT Education - 05/11/20 0015    Education Details Updated and reviewed HEP    Person(s) Educated Patient    Methods Explanation;Demonstration    Comprehension Verbalized understanding;Returned demonstration            PT Short Term Goals - 04/05/20 1752      PT SHORT TERM GOAL #1   Title Pt will be independent with initial HEP.    Baseline Pt given initial HEP during evaluation 12/28/2019.    Time 3    Period Weeks    Status Achieved    Target Date 01/18/20      PT SHORT TERM GOAL #2   Title Pt will have 25% decreased pain upon waking up in the morning.    Baseline reduced pain compared to initial evaluation.    Time 3    Period Weeks    Status Achieved    Target Date 01/18/20      PT SHORT TERM GOAL #3   Title Pt's FOTO score will improve from 26% limited to 23% limited or better.    Baseline see flowsheet    Time 3    Period Weeks    Status On-going    Target Date 01/18/20             PT Long Term Goals - 04/05/20 1753      PT LONG TERM GOAL #1   Title Pt will be independent with advanced HEP.    Baseline Pt given intial HEP at evaluation 12/28/2019.    Time 6    Period Weeks    Status On-going      PT LONG TERM GOAL #2   Title Pt will improve L hip FL MMT to 4/5 or greater.    Baseline see flowsheet    Time 6    Period Weeks    Status Achieved      PT LONG TERM GOAL #3   Title Pt's FOTO  score will improve from 26% limited to 20% limited or better.    Baseline see flowsheet    Time 6    Period Weeks    Status On-going                 Plan - 05/10/20 1747     Clinical Impression Statement Patient presents with TTP along bilateral PSIS (R > L) and continued burning sensation 3/10 in L lateral thigh upon arrival. He tolerated PT session well with no adverse effects. His low back pain was slightly aggravated following hip hinges and deadlifts but eased with core exercises and manual therapy. He responded well to left lumbar rotation mobilization (grades III and IV) with improved active left rotation during open books following manual compared to initial set. Pt expressed ease of symptoms at end of session after lumbar mobilization and left open book rotation (in right sidelying).    PT Treatment/Interventions ADLs/Self Care Home Management;Cryotherapy;Electrical Stimulation;Iontophoresis 4mg /ml Dexamethasone;Moist Heat;Traction;Gait training;Stair training;Functional mobility training;Therapeutic activities;Therapeutic exercise;Balance training;Neuromuscular re-education;Patient/family education;Manual techniques;Passive range of motion;Taping    PT Next Visit Plan Re-evaluation. FOTO. Review and update HEP. potential SIJ inflare - Progress core/LE and glute med/ adductor strengthening focus more on the L, continued TPDN PRN for glute med/ lumbar paraspinals, and thoracic/lumbar paraspinals. Body mechanics and core engagement with activities - hip hinge, deadlifts, squats. Left lumbar rotation mobilization, L open book rotation    PT Home Exercise Plan [id number]    Consulted and Agree with Plan of Care Patient           Patient will benefit from skilled therapeutic intervention in order to improve the following deficits and impairments:  Decreased activity tolerance,Decreased strength,Decreased endurance,Pain  Visit Diagnosis: Chronic left-sided low back pain with left-sided sciatica  Pain in left leg  Muscle weakness (generalized)  Difficulty in walking, not elsewhere classified     Problem List Patient Active Problem List   Diagnosis Date Noted   . Chronic left-sided low back pain with left-sided sciatica 12/01/2019  . Genital herpes simplex 09/24/2018  . Overweight (BMI 25.0-29.9) 09/24/2018  . Bipolar affective disorder, current episode depressed (HCC) 09/24/2018  . Generalized anxiety disorder 09/24/2018  . Social anxiety disorder 09/24/2018  . Insomnia 09/24/2018  . Recurrent major depression resistant to treatment (HCC) 09/24/2018  . ADD (attention deficit disorder) without hyperactivity 09/24/2018  . Elevated liver enzymes 09/24/2018  . Drug-induced erectile dysfunction 09/24/2018  . ATTENTION DEFICIT HYPERACTIVITY DISORDER 03/04/2007  . Seasonal and perennial allergic rhinitis 03/04/2007     05/02/2007, PT, DPT 05/11/20 12:18 AM  [facility] [address] [place], [place], [place] Phone: [phone]   Fax:  [phone]  Name: Terry [name] MRN: Beverly Gust Date of Birth: [birth year]

## 2020-05-16 ENCOUNTER — Encounter: Payer: Self-pay | Admitting: Physical Therapy

## 2020-05-16 ENCOUNTER — Other Ambulatory Visit: Payer: Self-pay

## 2020-05-16 ENCOUNTER — Ambulatory Visit: Payer: BC Managed Care – PPO | Admitting: Physical Therapy

## 2020-05-16 DIAGNOSIS — M5442 Lumbago with sciatica, left side: Secondary | ICD-10-CM

## 2020-05-16 DIAGNOSIS — G8929 Other chronic pain: Secondary | ICD-10-CM | POA: Diagnosis not present

## 2020-05-16 DIAGNOSIS — R262 Difficulty in walking, not elsewhere classified: Secondary | ICD-10-CM | POA: Diagnosis not present

## 2020-05-16 DIAGNOSIS — M6281 Muscle weakness (generalized): Secondary | ICD-10-CM | POA: Diagnosis not present

## 2020-05-16 DIAGNOSIS — M79605 Pain in left leg: Secondary | ICD-10-CM | POA: Diagnosis not present

## 2020-05-16 NOTE — Therapy (Signed)
[place], [place], [address] Phone: [phone]   Fax:  [phone]  Physical Therapy Treatment  Patient Details  Name: Terry [name] MRN: [id number] Date of Birth: 07/17/1985 Referring Provider (PT): Melvenia Beam, MD    Encounter Date: 05/16/2020   PT End of Session - 05/16/20 1800    Visit Number 12    Number of Visits 14    Date for PT Re-Evaluation 05/20/20    Authorization Type Blue Cross [employer]    PT Start Time 1703    PT Stop Time 1753    PT Time Calculation (min) 50 min    Activity Tolerance Patient tolerated treatment well    Behavior During Therapy [facility] for tasks assessed/performed           Past Medical History:  Diagnosis Date  . Acute appendicitis 02/08/2013  . ADHD (attention deficit hyperactivity disorder)   . Allergic rhinitis   . Appendicitis, acute 02/08/2013  . Depression    since 8th grade, hospitalized 61 ([place])  . HSV infection     Past Surgical History:  Procedure Laterality Date  . APPENDECTOMY     2013  . LAPAROSCOPIC APPENDECTOMY N/A 02/08/2013   Procedure: APPENDECTOMY LAPAROSCOPIC;  Surgeon: Earnstine Regal, MD;  Location: [facility];  Service: General;  Laterality: N/A;  . WISDOM TOOTH EXTRACTION      There were no vitals filed for this visit.   Subjective Assessment - 05/16/20 1725    Subjective Pt. rates progress/improvement with therapy at about 50-60% from baseline status at evaluation. Primary pain today in left lumbar region 3/10. He continues to report numbness in left thigh region which has been ongoing and still has intermittently had radiating pain described as burning in nature into thight but primary pain is local to lumbar region.    Patient Stated Goals being able to stand for a longer time without pain and do work activities without pain worsening    Currently in Pain? Yes    Pain Location Back    Pain Orientation Left;Lower    Pain  Descriptors / Indicators Sharp    Pain Type Chronic pain    Pain Radiating Towards numbness in left thigh, intermittent pain into left thigh    Pain Onset More than a month ago    Pain Frequency Intermittent   pain intermittent but numbness constant   Aggravating Factors  standing/walking and lifting    Pain Relieving Factors rest    Effect of Pain on Daily Activities impacts activity and positional tolerance              [facility] PT Assessment - 05/16/20 0001      Observation/Other Assessments   Focus on Therapeutic Outcomes (FOTO)  72% function      Strength   Overall Strength Comments left hip abduction 4+/5 otherwise LLE grossly 5/5                         [facility] Adult PT Treatment/Exercise - 05/16/20 0001      Lumbar Exercises: Stretches   Lower Trunk Rotation Limitations right LTR with legs on 55 cm P-ball x 10 reps    Pelvic Tilt Limitations brief review HEP form including dead bug progression      Manual Therapy   Joint Mobilization LAD left hip grade I-III oscillations    Soft tissue mobilization left lumbar paraspinals  Trigger Point Dry Needling - 05/16/20 0001    Consent Given? Yes    Muscles Treated Back/Hip Erector spinae;Lumbar multifidi    Dry Needling Comments needling to left side lumbar longissimus, iliocostalis and multifidi at L3-5 levels    Electrical Stimulation Performed with Dry Needling Yes    E-stim with Dry Needling Details TENS 2 pps x 10 minutes                PT Education - 05/16/20 1800    Education Details POC, HEP    Person(s) Educated Patient    Methods Explanation;Verbal cues    Comprehension Verbalized understanding;Returned demonstration            PT Short Term Goals - 05/16/20 1807      PT SHORT TERM GOAL #1   Title Pt will be independent with initial HEP.    Baseline met    Time 3    Period Weeks    Status Achieved      PT SHORT TERM GOAL #2   Title Pt will have 25% decreased pain  upon waking up in the morning.    Baseline met    Time 3    Period Weeks    Status Achieved      PT SHORT TERM GOAL #3   Title Pt's FOTO score will improve from 26% limited to 23% limited or better.    Baseline 28% limited 05/16/20    Time 3    Period Weeks    Status On-going             PT Long Term Goals - 05/16/20 1808      PT LONG TERM GOAL #1   Title Pt will be independent with advanced HEP.    Baseline met    Time 6    Period Weeks    Status Achieved      PT LONG TERM GOAL #2   Title Pt will improve L hip FL MMT to 4/5 or greater.    Baseline met    Time 6    Period Weeks    Status Achieved      PT LONG TERM GOAL #3   Title Pt's FOTO score will improve from 26% limited to 20% limited or better.    Baseline 28% limited/72% function    Time 6    Period Weeks    Status On-going                 Plan - 05/16/20 1801    Clinical Impression Statement Pt. has attended 12 therapy visits with moderate improvement in lumbar/LLE symptoms from baseline status with improving (but still present) local lumbar pain as well as persisting left thigh region parasthesias>pain but has made good progress with improving left LE strength. Discussed plan of care with pt. and he is in agreement with plans to continue via HEP and schedule follow up with Dr. Jaynee Eagles for further status. If recommended to return to therapy then would plan re-eval on return to establish new POC otherwise would plan d/c formal therapy.    Personal Factors and Comorbidities Comorbidity 1;Time since onset of injury/illness/exacerbation    Comorbidities depression    Examination-Activity Limitations Stand;Stairs;Bend;Squat;Lift;Sleep;Other    Examination-Participation Restrictions Community Activity    Stability/Clinical Decision Making Evolving/Moderate complexity    Clinical Decision Making Moderate    Rehab Potential Good    PT Frequency 1x / week    PT Duration 6 weeks    PT  Treatment/Interventions  ADLs/Self Care Home Management;Cryotherapy;Electrical Stimulation;Iontophoresis 65m/ml Dexamethasone;Moist Heat;Traction;Gait training;Stair training;Functional mobility training;Therapeutic activities;Therapeutic exercise;Balance training;Neuromuscular re-education;Patient/family education;Manual techniques;Passive range of motion;Taping    PT Next Visit Plan see above-if returning recert needed and continue exercise progression as tolerated, manual for lumbar and SIJ regionb and further dry needling prn    PT Home Exercise Plan [id number]    Consulted and Agree with Plan of Care Patient           Patient will benefit from skilled therapeutic intervention in order to improve the following deficits and impairments:  Decreased activity tolerance,Decreased strength,Decreased endurance,Pain  Visit Diagnosis: Chronic left-sided low back pain with left-sided sciatica  Pain in left leg  Muscle weakness (generalized)  Difficulty in walking, not elsewhere classified     Problem List Patient Active Problem List   Diagnosis Date Noted  . Chronic left-sided low back pain with left-sided sciatica 12/01/2019  . Genital herpes simplex 09/24/2018  . Overweight (BMI 25.0-29.9) 09/24/2018  . Bipolar affective disorder, [place] episode depressed ([place] 09/24/2018  . Generalized anxiety disorder 09/24/2018  . Social anxiety disorder 09/24/2018  . Insomnia 09/24/2018  . Recurrent major depression resistant to treatment ([place] 09/24/2018  . ADD (attention deficit disorder) without hyperactivity 09/24/2018  . Elevated liver enzymes 09/24/2018  . Drug-induced erectile dysfunction 09/24/2018  . ATTENTION DEFICIT HYPERACTIVITY DISORDER 03/04/2007  . Seasonal and perennial allergic rhinitis 03/04/2007   CBeaulah Dinning PT, DPT 05/16/20 6:11 PM  [facility] [place] [address]Phone: [phone]  Fax:  [phone] Name:  BSTILLMAN BUENGERMRN: [id number] of Birth: 1January 19, 1987

## 2020-06-05 ENCOUNTER — Ambulatory Visit (INDEPENDENT_AMBULATORY_CARE_PROVIDER_SITE_OTHER): Payer: BC Managed Care – PPO | Admitting: Family Medicine

## 2020-06-05 ENCOUNTER — Encounter: Payer: Self-pay | Admitting: Family Medicine

## 2020-06-05 ENCOUNTER — Other Ambulatory Visit: Payer: Self-pay

## 2020-06-05 VITALS — BP 126/80 | HR 72 | Temp 99.0°F | Resp 14 | Ht 71.0 in | Wt 207.0 lb

## 2020-06-05 DIAGNOSIS — Z13220 Encounter for screening for lipoid disorders: Secondary | ICD-10-CM | POA: Diagnosis not present

## 2020-06-05 DIAGNOSIS — F322 Major depressive disorder, single episode, severe without psychotic features: Secondary | ICD-10-CM | POA: Diagnosis not present

## 2020-06-05 DIAGNOSIS — F988 Other specified behavioral and emotional disorders with onset usually occurring in childhood and adolescence: Secondary | ICD-10-CM

## 2020-06-05 MED ORDER — MELOXICAM 15 MG PO TABS: 15.0000 mg | ORAL_TABLET | Freq: Every day | ORAL | 0 refills | Status: DC

## 2020-06-05 MED ORDER — LISDEXAMFETAMINE DIMESYLATE 50 MG PO CAPS: 50.0000 mg | ORAL_CAPSULE | Freq: Every day | ORAL | 0 refills | Status: DC

## 2020-06-05 NOTE — Progress Notes (Signed)
Subjective:    Patient ID: Terry [name], male    DOB: [birth year], 35 y.o.   MRN: [id number]  Thyroid tests were within the normal range 02/17/20.  Patient is a very pleasant 35 year old Caucasian male who presents today to discuss his depression.  He states he was doing so well in the winter.  However over the last several weeks, he feels down.  He states that he will have a bad day or 2 and then he will feel better.  This seems to come and go.  He also reports decreased energy.  He reports decreased focus.  He reports reports decreased motivation and concentration.  Is gotten to the point that it work, his boss is asking him if something is wrong because they have noticed a decline in his performance.  He also reports some crepitus with range of motion in his wrist.  This is equal bilaterally.  There is no erythema or warmth or swelling.  Exam today of his wrist is normal. Past Medical History:  Diagnosis Date  . Acute appendicitis 02/08/2013  . ADHD (attention deficit hyperactivity disorder)   . Allergic rhinitis   . Appendicitis, acute 02/08/2013  . Depression    since 8th grade, hospitalized 41 ([place])  . HSV infection    Past Surgical History:  Procedure Laterality Date  . APPENDECTOMY     2013  . LAPAROSCOPIC APPENDECTOMY N/A 02/08/2013   Procedure: APPENDECTOMY LAPAROSCOPIC;  Surgeon: Velora Heckler, MD;  Location: [facility];  Service: General;  Laterality: N/A;  . WISDOM TOOTH EXTRACTION     Current Outpatient Medications on File Prior to Visit  Medication Sig Dispense Refill  . FLUoxetine (PROZAC) 20 MG capsule TAKE 1 CAPSULE BY MOUTH EVERY DAY 90 capsule 2  . levothyroxine (SYNTHROID) 50 MCG tablet TAKE 1 TABLET BY MOUTH EVERY DAY 90 tablet 1  . lisdexamfetamine (VYVANSE) 40 MG capsule Take 1 capsule (40 mg total) by mouth every morning. 30 capsule 0  . OLANZapine (ZYPREXA) 5 MG tablet TAKE 1 TABLET BY MOUTH EVERYDAY AT BEDTIME 90 tablet 1  . sildenafil (VIAGRA) 100 MG  tablet Take 0.5-1 tablets (50-100 mg total) by mouth daily as needed for erectile dysfunction. 5 tablet 11  . traZODone (DESYREL) 100 MG tablet TAKE 1 TABLET BY MOUTH EVERYDAY AT BEDTIME 90 tablet 0  . valACYclovir (VALTREX) 1000 MG tablet TAKE 1 TABLET BY MOUTH TWICE A DAY AS NEEDED 30 tablet 0   No current facility-administered medications on file prior to visit.   No Known Allergies Social History   Socioeconomic History  . Marital status: Single    Spouse name: Not on file  . Number of children: 0  . Years of education: Not on file  . Highest education level: Bachelor's degree (e.g., BA, AB, BS)  Occupational History  . Occupation: works in garden department    Comment: [employer]   Tobacco Use  . Smoking status: Former Smoker    Packs/day: 0.50    Years: 3.00    Pack years: 1.50    Types: Cigarettes    Quit date: 2009    Years since quitting: 13.2  . Smokeless tobacco: Never Used  Vaping Use  . Vaping Use: Never used  Substance and Sexual Activity  . Alcohol use: Yes    Alcohol/week: 12.0 standard drinks    Types: 12 Cans of beer per week    Comment: week  . Drug use: Yes    Frequency: 7.0 times per  week    Types: Marijuana  . Sexual activity: Not on file  Other Topics Concern  . Not on file  Social History Narrative   Lives at home alone   Right handed   Caffeine: 1 cup each morning    Social Determinants of Health   Financial Resource Strain: Not on file  Food Insecurity: Not on file  Transportation Needs: Not on file  Physical Activity: Not on file  Stress: Not on file  Social Connections: Not on file  Intimate Partner Violence: Not on file   Family History  Problem Relation Age of Onset  . Allergic rhinitis Mother   . Cancer Maternal Grandfather        bone and prostate     Review of Systems  All other systems reviewed and are negative.      Objective:   Physical Exam Vitals reviewed.  Constitutional:      General: He is not in acute  distress.    Appearance: He is well-developed. He is not diaphoretic.  Cardiovascular:     Rate and Rhythm: Normal rate and regular rhythm.     Heart sounds: Normal heart sounds. No murmur heard. No friction rub. No gallop.   Pulmonary:     Effort: Pulmonary effort is normal. No respiratory distress.     Breath sounds: Normal breath sounds. No wheezing or rales.  Chest:     Chest wall: No tenderness.  Neurological:     Motor: No weakness.     Coordination: Coordination normal.     Gait: Gait normal.     Deep Tendon Reflexes: Reflexes normal.  Psychiatric:        Speech: Speech normal.        Behavior: Behavior normal.        Thought Content: Thought content normal. Thought content is not paranoid or delusional. Thought content does not include homicidal or suicidal ideation. Thought content does not include homicidal or suicidal plan.        Judgment: Judgment normal.           Assessment & Plan:  ADD (attention deficit disorder) without hyperactivity - Plan: COMPLETE METABOLIC PANEL WITH GFR, Lipid panel  Severe major depression without psychotic features (HCC) - Plan: COMPLETE METABOLIC PANEL WITH GFR, Lipid panel  Screening cholesterol level - Plan: COMPLETE METABOLIC PANEL WITH GFR, Lipid panel  I do not feel that increasing the Zyprexa is warranted which was his first question.  Certainly we can consider increasing the Prozac to 40 mg or even increasing the Vyvanse to 50 mg.  More of his issues at work have been due to poor concentration, poor focus, lack of attention.  Therefore we are going to increase his Vyvanse to 50 mg a day and then reassess via email in 1 to 2 weeks to see how he is doing.  If the situation is worsening, would increase Prozac to 40 mg at that time.

## 2020-06-06 ENCOUNTER — Other Ambulatory Visit: Payer: BC Managed Care – PPO

## 2020-06-06 DIAGNOSIS — Z13220 Encounter for screening for lipoid disorders: Secondary | ICD-10-CM | POA: Diagnosis not present

## 2020-06-06 DIAGNOSIS — F322 Major depressive disorder, single episode, severe without psychotic features: Secondary | ICD-10-CM | POA: Diagnosis not present

## 2020-06-06 DIAGNOSIS — F988 Other specified behavioral and emotional disorders with onset usually occurring in childhood and adolescence: Secondary | ICD-10-CM | POA: Diagnosis not present

## 2020-06-07 LAB — LIPID PANEL
Cholesterol: 144 mg/dL (ref <200)
HDL: 57 mg/dL (ref >=40)
LDL Cholesterol (Calc): 67 mg/dL (calc)
Non-HDL Cholesterol (Calc): 87 mg/dL (calc) (ref <130)
Total CHOL/HDL Ratio: 2.5 (calc) (ref <5.0)
Triglycerides: 118 mg/dL (ref <150)

## 2020-06-07 LAB — COMPLETE METABOLIC PANEL WITH GFR
AG Ratio: 2.2 (calc) (ref 1.0–2.5)
ALT: 42 U/L (ref 9–46)
AST: 22 U/L (ref 10–40)
Albumin: 4.4 g/dL (ref 3.6–5.1)
Alkaline phosphatase (APISO): 42 U/L (ref 36–130)
BUN: 24 mg/dL (ref 7–25)
CO2: 25 mmol/L (ref 20–32)
Calcium: 9.1 mg/dL (ref 8.6–10.3)
Chloride: 107 mmol/L (ref 98–110)
Creat: 1.01 mg/dL (ref 0.60–1.35)
GFR, Est African American: 112 mL/min/{1.73_m2} (ref >=60)
GFR, Est Non African American: 97 mL/min/{1.73_m2} (ref >=60)
Globulin: 2 g/dL (calc) (ref 1.9–3.7)
Glucose, Bld: 102 mg/dL — ABNORMAL HIGH (ref 65–99)
Potassium: 4.4 mmol/L (ref 3.5–5.3)
Sodium: 140 mmol/L (ref 135–146)
Total Bilirubin: 0.3 mg/dL (ref 0.2–1.2)
Total Protein: 6.4 g/dL (ref 6.1–8.1)

## 2020-06-08 ENCOUNTER — Encounter: Payer: Self-pay | Admitting: Family Medicine

## 2020-06-16 ENCOUNTER — Other Ambulatory Visit: Payer: Self-pay | Admitting: *Deleted

## 2020-06-16 ENCOUNTER — Other Ambulatory Visit: Payer: Self-pay | Admitting: Family Medicine

## 2020-06-16 MED ORDER — OLANZAPINE 5 MG PO TABS: ORAL_TABLET | ORAL | 1 refills | Status: DC

## 2020-06-16 NOTE — Telephone Encounter (Signed)
Received fax requesting refill on Zyprexa.   Ok to refill?

## 2020-06-16 NOTE — Addendum Note (Signed)
Addended by: Phillips Odor on: 06/16/2020 04:54 PM   Modules accepted: Orders

## 2020-06-17 ENCOUNTER — Other Ambulatory Visit: Payer: Self-pay | Admitting: Family Medicine

## 2020-06-27 ENCOUNTER — Ambulatory Visit: Payer: BC Managed Care – PPO | Admitting: Neurology

## 2020-06-30 ENCOUNTER — Telehealth: Payer: Self-pay | Admitting: Family Medicine

## 2020-06-30 MED ORDER — NIRMATRELVIR/RITONAVIR (PAXLOVID)TABLET: ORAL_TABLET | ORAL | 0 refills | Status: DC

## 2020-06-30 NOTE — Telephone Encounter (Signed)
Received call from patient father.   States that patient tested COVID positive on 06/30/2020.  Reports that he has fatigue, body aches, HA, sinus pressure, and fever (T Max 100.4).   Advised to use OTC medications for Sx. Given advice on emergent Sx that require evaluation.   Prescription sent to pharmacy for Paxlovid.

## 2020-06-30 NOTE — Telephone Encounter (Signed)
Patient called to follow up on call placed earlier; needs advice about how to treat symptoms. Fatigue, muscle and joint aches, headache, pressure behind eyes, 99.3 fever. Taking ibuprophen. Please advise at [phone].

## 2020-07-02 ENCOUNTER — Encounter: Payer: Self-pay | Admitting: Family Medicine

## 2020-07-03 ENCOUNTER — Other Ambulatory Visit: Payer: Self-pay

## 2020-07-04 ENCOUNTER — Other Ambulatory Visit: Payer: Self-pay | Admitting: Family Medicine

## 2020-07-04 MED ORDER — LISDEXAMFETAMINE DIMESYLATE 50 MG PO CAPS: 50.0000 mg | ORAL_CAPSULE | Freq: Every day | ORAL | 0 refills | Status: DC

## 2020-07-05 ENCOUNTER — Encounter: Payer: Self-pay | Admitting: Family Medicine

## 2020-07-05 DIAGNOSIS — F322 Major depressive disorder, single episode, severe without psychotic features: Secondary | ICD-10-CM

## 2020-07-06 ENCOUNTER — Encounter: Payer: Self-pay | Admitting: Neurology

## 2020-07-06 ENCOUNTER — Ambulatory Visit (INDEPENDENT_AMBULATORY_CARE_PROVIDER_SITE_OTHER): Payer: BC Managed Care – PPO | Admitting: Neurology

## 2020-07-06 VITALS — BP 120/69 | HR 57 | Ht 71.0 in | Wt 195.0 lb

## 2020-07-06 DIAGNOSIS — G8929 Other chronic pain: Secondary | ICD-10-CM

## 2020-07-06 DIAGNOSIS — M545 Low back pain, unspecified: Secondary | ICD-10-CM

## 2020-07-06 DIAGNOSIS — M533 Sacrococcygeal disorders, not elsewhere classified: Secondary | ICD-10-CM

## 2020-07-06 MED ORDER — METHOCARBAMOL 500 MG PO TABS: 500.0000 mg | ORAL_TABLET | Freq: Four times a day (QID) | ORAL | 6 refills | Status: DC | PRN

## 2020-07-06 MED ORDER — METHYLPREDNISOLONE 4 MG PO TBPK: ORAL_TABLET | ORAL | 1 refills | Status: DC

## 2020-07-06 NOTE — Progress Notes (Signed)
[id number]    Provider:  Dr Lucia GaskinsAhern Requesting Provider: Donita BrooksPickard, Warren T, MD Primary Care Provider:  Donita BrooksPickard, Warren T, MD  CC:  Low back pain  Interval history 07/06/2020: MRI of the lumbar spine showed sent for surgical evaluation 11/2019. The physician didn't think there was indication for surgery. Today her points to the SI joint on the left. He went to PT, it helped, dry needling helped a lot on the lumbar paraspinals, he has a lot of tightening, now it is more in the lower back as opposed to shooting pain down the leg. Hurts mostly during the day when standing up. Point to the left of the spine (SI JOINT)   MRI lumbar spine reviewed images follows: T12 - L1:  The disc and interspace appear normal. L1 - L2:  The disc appears normal.  There is increased epidural fat.  No spinal stenosis.  No nerve root compression.   L2 - L3:  The disc appears normal.  There is increased epidural fat.  No spinal stenosis.  No nerve root compression.   L3 - L4: There is mild spinal stenosis due disc bulging, moderate left facet hypertrophy, left ligamenta flava hypertrophy and to increased epidural fat (epidural lipomatosis).  There is no significant foraminal narrowing.  There is moderate left lateral recess stenosis but no definite nerve root compression. L4 - L5: There is moderate spinal stenosis due to disc bulging, mild facet hypertrophy and increased epidural fat..  There is mild to moderate left lateral recess and mild right lateral recess stenosis stenosis but no nerve root compression. L5 - S1: There is mild spinal stenosis due to mild disc bulging and mild increased epidural fat but no nerve root compression..   IMPRESSION: This MRI of the lumbar spine shows the following: 1.    There is epidural lipomatosis mostly affecting the epidural fat posterior to the thecal sac.  This is most often seen with steroid use but can be idiopathic. 2.    At L3-L4, there is mild spinal  stenosis due to left facet and ligamenta flava hypertrophy combining with the increased epidural fat to cause moderate left lateral recess stenosis.  There is no definite nerve root compression. 3.    At L4-L5, there is moderate spinal stenosis due to disc bulging and increased epidural fat.  There is mild to moderate left lateral recess stenosis but no nerve root compression. 4.    At L5-S1, there is mild spinal stenosis due to disc bulging and increased epidural fat but no nerve root compression.   HPI:  Terry [name] is a 35 y.o. male here as requested by Donita BrooksPickard, Warren T, MD for Leg pain.  Past medical history depression, ADHD, bipolar, anxiety.  Patient is here and he reports back and leg pain, large section of his left lateral thigh is numb and its gotten worse, has been going on for about 10 years but he did not do anything about it, he bent down and then sneezed and felt a pop since then he has had numbness, he has back pain, pressure has to pop his back all day. He felt a pop in his lower back and all of a sudden the left side of the thigh has numb. Continuously numb, he feels tingling and burning and occasionally electricity. Standing still for a long time makes it worse, standing on hard surfaces, no weakness, never been obese, no tight clothing, no numbness or tingling in is feet, he had an MRI  of his back when it happened and they said they couldn;t see or do anything and he had a shot of cortizone and he did not like that. It didn't help. He has put up with it over the last 10 years and getting worse. On top of that his back in general has been bothering him more. 5-6 in pain and sometimes it radiates below the knee to the top of the foot. No other issues, no upper extremity problems, no changes in bowel or bladder. No other focal neurologic deficits, associated symptoms, inciting events or modifiable factors.  Reviewed notes, labs and imaging from outside physicians, which showed:  TSH  normal, CMP normal   I reviewed Dr. Felisa Bonier notes. States patient with neurolopathy vs radiculopathy. Suggested emg/ncs, ongoing 10 years, emg/ncs may be able to differentiate between the two.   Review of Systems: Patient complains of symptoms per HPI as well as the following symptoms: low back pain . Pertinent negatives and positives per HPI. All others negative    Social History   Socioeconomic History  . Marital status: Single    Spouse name: Not on file  . Number of children: 0  . Years of education: Not on file  . Highest education level: Bachelor's degree (e.g., BA, AB, BS)  Occupational History  . Occupation: works in garden department    Comment: [employer]   Tobacco Use  . Smoking status: Former Smoker    Packs/day: 0.50    Years: 3.00    Pack years: 1.50    Types: Cigarettes    Quit date: 2009    Years since quitting: 13.3  . Smokeless tobacco: Never Used  Vaping Use  . Vaping Use: Never used  Substance and Sexual Activity  . Alcohol use: Yes    Alcohol/week: 12.0 standard drinks    Types: 12 Cans of beer per week    Comment: week  . Drug use: Yes    Frequency: 7.0 times per week    Types: Marijuana  . Sexual activity: Not on file  Other Topics Concern  . Not on file  Social History Narrative   Lives at home alone   Right handed   Caffeine: 1 cup each morning    Social Determinants of Health   Financial Resource Strain: Not on file  Food Insecurity: Not on file  Transportation Needs: Not on file  Physical Activity: Not on file  Stress: Not on file  Social Connections: Not on file  Intimate Partner Violence: Not on file    Family History  Problem Relation Age of Onset  . Allergic rhinitis Mother   . Cancer Maternal Grandfather        bone and prostate    Past Medical History:  Diagnosis Date  . Acute appendicitis 02/08/2013  . ADHD (attention deficit hyperactivity disorder)   . Allergic rhinitis   . Appendicitis, acute 02/08/2013  .  Depression    since 8th grade, hospitalized 30 ([place])  . HSV infection     Patient Active Problem List   Diagnosis Date Noted  . Chronic left-sided low back pain with left-sided sciatica 12/01/2019  . Genital herpes simplex 09/24/2018  . Overweight (BMI 25.0-29.9) 09/24/2018  . Bipolar affective disorder, current episode depressed (HCC) 09/24/2018  . Generalized anxiety disorder 09/24/2018  . Social anxiety disorder 09/24/2018  . Insomnia 09/24/2018  . Recurrent major depression resistant to treatment (HCC) 09/24/2018  . ADD (attention deficit disorder) without hyperactivity 09/24/2018  . Elevated liver enzymes  09/24/2018  . Drug-induced erectile dysfunction 09/24/2018  . ATTENTION DEFICIT HYPERACTIVITY DISORDER 03/04/2007  . Seasonal and perennial allergic rhinitis 03/04/2007    Past Surgical History:  Procedure Laterality Date  . APPENDECTOMY     2013  . LAPAROSCOPIC APPENDECTOMY N/A 02/08/2013   Procedure: APPENDECTOMY LAPAROSCOPIC;  Surgeon: Velora Heckler, MD;  Location: [facility];  Service: General;  Laterality: N/A;  . WISDOM TOOTH EXTRACTION      Current Outpatient Medications  Medication Sig Dispense Refill  . FLUoxetine (PROZAC) 20 MG capsule TAKE 1 CAPSULE BY MOUTH EVERY DAY 90 capsule 2  . levothyroxine (SYNTHROID) 50 MCG tablet TAKE 1 TABLET BY MOUTH EVERY DAY 90 tablet 1  . lisdexamfetamine (VYVANSE) 50 MG capsule Take 1 capsule (50 mg total) by mouth daily. 30 capsule 0  . meloxicam (MOBIC) 15 MG tablet TAKE 1 TABLET (15 MG TOTAL) BY MOUTH DAILY. 30 tablet 0  . methocarbamol (ROBAXIN) 500 MG tablet Take 1 tablet (500 mg total) by mouth every 6 (six) hours as needed for muscle spasms. 120 tablet 6  . methylPREDNISolone (MEDROL DOSEPAK) 4 MG TBPK tablet Take pills in the morning together with food for 6 days 21 tablet 1  . nirmatrelvir/ritonavir EUA (PAXLOVID) TABS Take nirmatrelvir (150 mg) 2 tablet(s) twice daily for 5 days and ritonavir (100 mg) one tablet twice  daily for 5 days. 30 tablet 0  . OLANZapine (ZYPREXA) 5 MG tablet TAKE 1 TABLET BY MOUTH EVERYDAY AT BEDTIME 90 tablet 1  . sildenafil (VIAGRA) 100 MG tablet Take 0.5-1 tablets (50-100 mg total) by mouth daily as needed for erectile dysfunction. 5 tablet 11  . traZODone (DESYREL) 100 MG tablet TAKE 1 TABLET BY MOUTH EVERYDAY AT BEDTIME 90 tablet 0  . valACYclovir (VALTREX) 1000 MG tablet TAKE 1 TABLET BY MOUTH TWICE A DAY AS NEEDED 30 tablet 0   No current facility-administered medications for this visit.    Allergies as of 07/06/2020  . (No Known Allergies)    Vitals: BP 120/69 (BP Location: Right Arm, Patient Position: Sitting)   Pulse (!) 57   Ht 5\' 11"  (1.803 m)   Wt 195 lb (88.5 kg)   BMI 27.20 kg/m  Last Weight:  Wt Readings from Last 1 Encounters:  07/06/20 195 lb (88.5 kg)   Last Height:   Ht Readings from Last 1 Encounters:  07/06/20 5\' 11"  (1.803 m)   Exam: NAD, pleasant                  Speech:    Speech is normal; fluent and spontaneous with normal comprehension.  Cognition:    The patient is oriented to person, place, and time;     recent and remote memory intact;     language fluent;    Cranial Nerves:    The pupils are equal, round, and reactive to light.Trigeminal sensation is intact and the muscles of mastication are normal. The face is symmetric. The palate elevates in the midline. Hearing intact. Voice is normal. Shoulder shrug is normal. The tongue has normal motion without fasciculations.   Coordination:  No dysmetria  Motor Observation:    No asymmetry, no atrophy, and no involuntary movements noted. Tone:    Normal muscle tone.     Strength:    Strength is V/V in the upper and lower limbs.      Sensation: intact to LT     Assessment/Plan: Chronic low back pain.  Low back pain possibly left SI joint pain  in a young 35 year old active male.   - MRI of the lumbar spine showed degenerative changes and we sent for surgical evaluation 11/2019.  The physician at [facility] didn't think there was indication for surgery and today he does report improvement after PT without radicular symptoms. - Today he points moreso to the SI joint on the left. He went to PT, it helped, dry needling helped a lot on the lumbar paraspinals, he has a lot of tightening, now symptoms are more localized to the left lower back as opposed to shooting pain down the leg. Hurts mostly during the day when standing up. Points to the left of the spine (SI JOINT) - Will see if Sports Medicine can help or perform SI joint injections. If not will send to [facility]. - consider [employer] for dry needling and the [employer] - try a medrol dospeak. Also methocarbamol.  Meds ordered this encounter  Medications  . methylPREDNISolone (MEDROL DOSEPAK) 4 MG TBPK tablet    Sig: Take pills in the morning together with food for 6 days    Dispense:  21 tablet    Refill:  1  . methocarbamol (ROBAXIN) 500 MG tablet    Sig: Take 1 tablet (500 mg total) by mouth every 6 (six) hours as needed for muscle spasms.    Dispense:  120 tablet    Refill:  6      Orders Placed This Encounter  Procedures  . AMB referral to sports medicine  . Ambulatory referral to Pain Clinic     Cc: [name], MD,  [name], MD  Naomie Dean, MD  [facility] [address] [place], [place] [address]  Phone [phone] Fax [phone]  I spent over 30 minutes of face-to-face and non-face-to-face time with patient on the  1. Chronic left-sided low back pain without sciatica   2. Chronic left SI joint pain    diagnosis.  This included previsit chart review, lab review, study review, order entry, electronic health record documentation, patient education on the different diagnostic and therapeutic options, counseling and coordination of care, risks and benefits of management, compliance, or risk factor reduction

## 2020-07-06 NOTE — Patient Instructions (Addendum)
[facility] [email or web address] Pain doctor at [facility] for evaluation of left SI join  Medrol dosepak Methocarbamol during the day Sports medicine referral    Sacroiliac Joint Dysfunction  Sacroiliac joint dysfunction is a condition that causes inflammation on one or both sides of the sacroiliac (SI) joint. The SI joint is the joint between two bones of the pelvis called the sacrum and the ilium. The sacrum is the bone at the base of the spine. The ilium is the large bone that forms the hip. This condition causes deep aching or burning pain in the low back. In some cases, the pain may also spread into one or both buttocks, hips, or thighs. What are the causes? This condition may be caused by:  Pregnancy. During pregnancy, extra stress is put on the SI joints because the pelvis widens.  Injury, such as: ? Injuries from car crashes. ? Sports-related injuries. ? Work-related injuries.  Having one leg that is shorter than the other.  Conditions that affect the joints, such as: ? Rheumatoid arthritis. ? Gout. ? Psoriatic arthritis. ? Joint infection (septic arthritis). Sometimes, the cause of SI joint dysfunction is not known. What are the signs or symptoms? Symptoms of this condition include:  Aching or burning pain in the lower back. The pain may also spread to other areas, such as: ? Buttocks. ? Groin. ? Thighs.  Muscle spasms in or around the painful areas.  Increased pain when standing, walking, running, stair climbing, bending, or lifting. How is this diagnosed? This condition is diagnosed with a physical exam and your medical history. During the exam, the health care provider may move one or both of your legs to different positions to check for pain. Various tests may be done to confirm the diagnosis, including:  Imaging tests to look for other causes of pain. These may include: ? MRI. ? CT scan. ? Bone scan.  Diagnostic injection. A numbing medicine is  injected into the SI joint using a needle. If your pain is temporarily improved or stopped after the injection, this can indicate that SI joint dysfunction is the problem. How is this treated? Treatment depends on the cause and severity of your condition. Treatment options can be noninvasive and may include:  Ice or heat applied to the lower back area after an injury. This may help reduce pain and muscle spasms.  Medicines to relieve pain or inflammation or to relax the muscles.  Wearing a back brace (sacroiliac brace) to help support the joint while your back is healing.  Physical therapy to increase muscle strength around the joint and flexibility at the joint. This may also involve learning proper body positions and ways of moving to relieve stress on the joint.  Direct manipulation of the SI joint.  Use of a device that provides electrical stimulation to help reduce pain at the joint. Other treatments may include:  Injections of steroid medicine into the joint to reduce pain and swelling.  Radiofrequency ablation. This treatment uses heat to burn away nerves that are carrying pain messages from the joint.  Surgery to put in screws and plates that limit or prevent joint motion. This is rare. Follow these instructions at home: Medicines  Take over-the-counter and prescription medicines only as told by your health care provider.  Ask your health care provider if the medicine prescribed to you: ? Requires you to avoid driving or using machinery. ? Can cause constipation. You may need to take these actions to prevent or treat constipation:  Drink enough fluid to keep your urine pale yellow.  Take over-the-counter or prescription medicines.  Eat foods that are high in fiber, such as beans, whole grains, and fresh fruits and vegetables.  Limit foods that are high in fat and processed sugars, such as fried or sweet foods. If you have a brace:  Wear the brace as told by your health  care provider. Remove it only as told by your health care provider.  Keep the brace clean.  If the brace is not waterproof: ? Do not let it get wet. ? Cover it with a watertight covering when you take a bath or a shower. Managing pain, stiffness, and swelling  Icing can help with pain and swelling. Heat may help with muscle tension or spasms. Ask your health care provider if you should use ice or heat.  If directed, put ice on the affected area: ? If you have a removable brace, remove it as told by your health care provider. ? Put ice in a plastic bag. ? Place a towel between your skin and the bag. ? Leave the ice on for 20 minutes, 2-3 times a day. ? Remove the ice if your skin turns bright red. This is very important. If you cannot feel pain, heat, or cold, you have a greater risk of damage to the area.  If directed, apply heat to the affected area as often as told by your health care provider. Use the heat source that your health care provider recommends, such as a moist heat pack or a heating pad. ? Place a towel between your skin and the heat source. ? Leave the heat on for 20-30 minutes. ? Remove the heat if your skin turns bright red. This is especially important if you are unable to feel pain, heat, or cold. You may have a greater risk of getting burned.      General instructions  Rest as needed. Return to your normal activities as told by your health care provider. Ask your health care provider what activities are safe for you.  Do exercises as told by your health care provider or physical therapist.  Keep all follow-up visits. This is important. Contact a health care provider if:  Your pain is not controlled with medicine.  You have a fever.  Your pain is getting worse. Get help right away if:  You have weakness, numbness, or tingling in your legs or feet.  You lose control of your bladder or bowels. Summary  Sacroiliac (SI) joint dysfunction is a condition that  causes inflammation on one or both sides of the SI joint.  This condition causes deep aching or burning pain in the low back. In some cases, the pain may also spread into one or both buttocks, hips, or thighs.  Treatment depends on the cause and severity of your condition. It may include medicines to reduce pain and swelling or to relax muscles. This information is not intended to replace advice given to you by your health care provider. Make sure you discuss any questions you have with your health care provider. Document Revised: 06/24/2019 Document Reviewed: 06/24/2019 Elsevier Patient Education  2021 Elsevier Inc.  Methylprednisolone tablets What is this medicine? METHYLPREDNISOLONE (meth ill pred NISS oh lone) is a corticosteroid. It is commonly used to treat inflammation of the skin, joints, lungs, and other organs. Common conditions treated include asthma, allergies, and arthritis. It is also used for other conditions, such as blood disorders and diseases of the adrenal glands.  This medicine may be used for other purposes; ask your health care provider or pharmacist if you have questions. COMMON BRAND NAME(S): Medrol, Medrol Dosepak What should I tell my health care provider before I take this medicine? They need to know if you have any of these conditions:  Cushing's syndrome  eye disease, vision problems  diabetes  glaucoma  heart disease  high blood pressure  infection (especially a virus infection such as chickenpox, cold sores, or herpes)  liver disease  mental illness  myasthenia gravis  osteoporosis  recently received or scheduled to receive a vaccine  seizures  stomach or intestine problems  thyroid disease  an unusual or allergic reaction to lactose, methylprednisolone, other medicines, foods, dyes, or preservatives  pregnant or trying to get pregnant  breast-feeding How should I use this medicine? Take this medicine by mouth with a glass of water.  Follow the directions on the prescription label. Take this medicine with food. If you are taking this medicine once a day, take it in the morning. Do not take it more often than directed. Do not suddenly stop taking your medicine because you may develop a severe reaction. Your doctor will tell you how much medicine to take. If your doctor wants you to stop the medicine, the dose may be slowly lowered over time to avoid any side effects. Talk to your pediatrician regarding the use of this medicine in children. Special care may be needed. Overdosage: If you think you have taken too much of this medicine contact a poison control center or emergency room at once. NOTE: This medicine is only for you. Do not share this medicine with others. What if I miss a dose? If you miss a dose, take it as soon as you can. If it is almost time for your next dose, talk to your doctor or health care professional. You may need to miss a dose or take an extra dose. Do not take double or extra doses without advice. What may interact with this medicine? Do not take this medicine with any of the following medications:  alefacept  echinacea  live virus vaccines  metyrapone  mifepristone This medicine may also interact with the following medications:  amphotericin B  aspirin and aspirin-like medicines  certain antibiotics like erythromycin, clarithromycin, troleandomycin  certain medicines for diabetes  certain medicines for fungal infections like ketoconazole  certain medicines for seizures like carbamazepine, phenobarbital, phenytoin  certain medicines that treat or prevent blood clots like warfarin  cholestyramine  cyclosporine  digoxin  diuretics  male hormones, like estrogens and birth control pills  isoniazid  NSAIDs, medicines for pain inflammation, like ibuprofen or naproxen  other medicines for myasthenia gravis  rifampin  vaccines This list may not describe all possible  interactions. Give your health care provider a list of all the medicines, herbs, non-prescription drugs, or dietary supplements you use. Also tell them if you smoke, drink alcohol, or use illegal drugs. Some items may interact with your medicine. What should I watch for while using this medicine? Tell your doctor or healthcare professional if your symptoms do not start to get better or if they get worse. Do not stop taking except on your doctor's advice. You may develop a severe reaction. Your doctor will tell you how much medicine to take. This medicine may increase your risk of getting an infection. Tell your doctor or health care professional if you are around anyone with measles or chickenpox, or if you develop sores or blisters  that do not heal properly. This medicine may increase blood sugar levels. Ask your healthcare provider if changes in diet or medicines are needed if you have diabetes. Tell your doctor or health care professional right away if you have any change in your eyesight. Using this medicine for a long time may increase your risk of low bone mass. Talk to your doctor about bone health. What side effects may I notice from receiving this medicine? Side effects that you should report to your doctor or health care professional as soon as possible:  allergic reactions like skin rash, itching or hives, swelling of the face, lips, or tongue  bloody or tarry stools  hallucination, loss of contact with reality  muscle cramps  muscle pain  palpitations  signs and symptoms of high blood sugar such as being more thirsty or hungry or having to urinate more than normal. You may also feel very tired or have blurry vision.  signs and symptoms of infection like fever or chills; cough; sore throat; pain or trouble passing urine Side effects that usually do not require medical attention (report to your doctor or health care professional if they continue or are bothersome):  changes in  emotions or mood  constipation  diarrhea  excessive hair growth on the face or body  headache  nausea, vomiting  trouble sleeping  weight gain This list may not describe all possible side effects. Call your doctor for medical advice about side effects. You may report side effects to FDA at 1-800-FDA-1088. Where should I keep my medicine? Keep out of the reach of children. Store at room temperature between 20 and 25 degrees C (68 and 77 degrees F). Throw away any unused medicine after the expiration date. NOTE: This sheet is a summary. It may not cover all possible information. If you have questions about this medicine, talk to your doctor, pharmacist, or health care provider.  2021 Elsevier/Gold Standard (2017-11-13 09:19:36) Methocarbamol tablets What is this medicine? METHOCARBAMOL (meth oh KAR ba mole) helps to relieve pain and stiffness in muscles caused by strains, sprains, or other injury to your muscles. This medicine may be used for other purposes; ask your health care provider or pharmacist if you have questions. COMMON BRAND NAME(S): Robaxin What should I tell my health care provider before I take this medicine? They need to know if you have any of these conditions:  kidney disease  seizures  an unusual or allergic reaction to methocarbamol, other medicines, foods, dyes, or preservatives  pregnant or trying to get pregnant  breast-feeding How should I use this medicine? Take this medicine by mouth with a full glass of water. Follow the directions on the prescription label. Take your medicine at regular intervals. Do not take your medicine more often than directed. Talk to your pediatrician regarding the use of this medicine in children. Special care may be needed. Overdosage: If you think you have taken too much of this medicine contact a poison control center or emergency room at once. NOTE: This medicine is only for you. Do not share this medicine with  others. What if I miss a dose? If you miss a dose, take it as soon as you can. If it is almost time for your next dose, take only the next dose. Do not take double or extra doses. What may interact with this medicine? Do not take this medication with any of the following medicines:  narcotic medicines for cough This medicine may also interact with the following  medications:  alcohol  antihistamines for allergy, cough and cold  certain medicines for anxiety or sleep  certain medicines for depression like amitriptyline, fluoxetine, sertraline  certain medicines for seizures like phenobarbital, primidone  cholinesterase inhibitors like neostigmine, ambenonium, and pyridostigmine bromide  general anesthetics like halothane, isoflurane, methoxyflurane, propofol  local anesthetics like lidocaine, pramoxine, tetracaine  medicines that relax muscles for surgery  narcotic medicines for pain  phenothiazines like chlorpromazine, mesoridazine, prochlorperazine, thioridazine This list may not describe all possible interactions. Give your health care provider a list of all the medicines, herbs, non-prescription drugs, or dietary supplements you use. Also tell them if you smoke, drink alcohol, or use illegal drugs. Some items may interact with your medicine. What should I watch for while using this medicine? Tell your doctor or health care professional if your symptoms do not start to get better or if they get worse. You may get drowsy or dizzy. Do not drive, use machinery, or do anything that needs mental alertness until you know how this medicine affects you. Do not stand or sit up quickly, especially if you are an older patient. This reduces the risk of dizzy or fainting spells. Alcohol may interfere with the effect of this medicine. Avoid alcoholic drinks. If you are taking another medicine that also causes drowsiness, you may have more side effects. Give your health care provider a list of  all medicines you use. Your doctor will tell you how much medicine to take. Do not take more medicine than directed. Call emergency for help if you have problems breathing or unusual sleepiness. What side effects may I notice from receiving this medicine? Side effects that you should report to your doctor or health care professional as soon as possible:  allergic reactions like skin rash, itching or hives, swelling of the face, lips, or tongue  breathing problems  confusion  seizures  unusually weak or tired Side effects that usually do not require medical attention (report to your doctor or health care professional if they continue or are bothersome):  dizziness  headache  metallic taste  tiredness  upset stomach This list may not describe all possible side effects. Call your doctor for medical advice about side effects. You may report side effects to FDA at 1-800-FDA-1088. Where should I keep my medicine? Keep out of the reach of children. Store at room temperature between 20 and 25 degrees C (68 and 77 degrees F). Keep container tightly closed. Throw away any unused medicine after the expiration date. NOTE: This sheet is a summary. It may not cover all possible information. If you have questions about this medicine, talk to your doctor, pharmacist, or health care provider.  2021 Elsevier/Gold Standard (2014-11-22 13:11:54)

## 2020-08-01 DIAGNOSIS — Y93B2 Activity, push-ups, pull-ups, sit-ups: Secondary | ICD-10-CM | POA: Diagnosis not present

## 2020-08-01 DIAGNOSIS — S46911A Strain of unspecified muscle, fascia and tendon at shoulder and upper arm level, right arm, initial encounter: Secondary | ICD-10-CM | POA: Diagnosis not present

## 2020-08-09 NOTE — Progress Notes (Signed)
Terry [name] [address] [place] [address] Phone: [phone] Subjective:   Terry [name], am serving as a scribe for Dr. Antoine Primas.  I'm seeing this patient by the request  of:  [name], MD  CC: Low back pain  TDV:[id number]  Terry [name] is a 35 y.o. male coming in with complaint of LBP, L side. Started back in the last year L Lower back pain. Patient describes pain as stabbing in nature that will radiate down the left leg.  worse when standing long term or waling a lot or moving in a certain direction. States the left leg is always is always numb starting from Left lower back to left thigh. Tried Ibuprofen and meloxicam and then often feels like he needs to adjust or pop his back. Patient states that when he was 23 that he had injured his back while bent over he sneezed and he felt a pop in his lower left back and ended up having MRI's and back injections to help with the pain.   MRI lumbar 11/2019   IMPRESSION: This MRI of the lumbar spine shows the following: 1.    There is epidural lipomatosis mostly affecting the epidural fat posterior to the thecal sac.  This is most often seen with steroid use but can be idiopathic. 2.    At L3-L4, there is mild spinal stenosis due to left facet and ligamenta flava hypertrophy combining with the increased epidural fat to cause moderate left lateral recess stenosis.  There is no definite nerve root compression. 3.    At L4-L5, there is moderate spinal stenosis due to disc bulging and increased epidural fat.  There is mild to moderate left lateral recess stenosis but no nerve root compression. 4.    At L5-S1, there is mild spinal stenosis due to disc bulging and increased epidural fat but no nerve root compression.     Past Medical History:  Diagnosis Date   Acute appendicitis 02/08/2013   ADHD (attention deficit hyperactivity disorder)    Allergic rhinitis    Appendicitis, acute  02/08/2013   Depression    since 8th grade, hospitalized 2015 ([place])   HSV infection    Past Surgical History:  Procedure Laterality Date   APPENDECTOMY     2013   LAPAROSCOPIC APPENDECTOMY N/A 02/08/2013   Procedure: APPENDECTOMY LAPAROSCOPIC;  Surgeon: Velora Heckler, MD;  Location: [facility];  Service: General;  Laterality: N/A;   WISDOM TOOTH EXTRACTION     Social History   Socioeconomic History   Marital status: Single    Spouse name: Not on file   Number of children: 0   Years of education: Not on file   Highest education level: Bachelor's degree (e.g., BA, AB, BS)  Occupational History   Occupation: works in garden department    Comment: [employer]   Tobacco Use   Smoking status: Former    Packs/day: 0.50    Years: 3.00    Pack years: 1.50    Types: Cigarettes    Quit date: 2009    Years since quitting: 13.4   Smokeless tobacco: Never  Vaping Use   Vaping Use: Never used  Substance and Sexual Activity   Alcohol use: Yes    Alcohol/week: 12.0 standard drinks    Types: 12 Cans of beer per week    Comment: week   Drug use: Yes    Frequency: 7.0 times per week    Types:  Marijuana   Sexual activity: Not on file  Other Topics Concern   Not on file  Social History Narrative   Lives at home alone   Right handed   Caffeine: 1 cup each morning    Social Determinants of Health   Financial Resource Strain: Not on file  Food Insecurity: Not on file  Transportation Needs: Not on file  Physical Activity: Not on file  Stress: Not on file  Social Connections: Not on file   No Known Allergies Family History  Problem Relation Age of Onset   Allergic rhinitis Mother    Cancer Maternal Grandfather        bone and prostate    Current Outpatient Medications (Endocrine & Metabolic):    levothyroxine (SYNTHROID) 50 MCG tablet, TAKE 1 TABLET BY MOUTH EVERY DAY  Current Outpatient Medications (Cardiovascular):    sildenafil (VIAGRA) 100 MG tablet, Take 0.5-1 tablets  (50-100 mg total) by mouth daily as needed for erectile dysfunction.   Current Outpatient Medications (Analgesics):    meloxicam (MOBIC) 15 MG tablet, TAKE 1 TABLET (15 MG TOTAL) BY MOUTH DAILY.   Current Outpatient Medications (Other):    FLUoxetine (PROZAC) 20 MG capsule, TAKE 1 CAPSULE BY MOUTH EVERY DAY   lisdexamfetamine (VYVANSE) 50 MG capsule, Take 1 capsule (50 mg total) by mouth daily.   OLANZapine (ZYPREXA) 5 MG tablet, TAKE 1 TABLET BY MOUTH EVERYDAY AT BEDTIME   traZODone (DESYREL) 100 MG tablet, TAKE 1 TABLET BY MOUTH EVERYDAY AT BEDTIME   valACYclovir (VALTREX) 1000 MG tablet, TAKE 1 TABLET BY MOUTH TWICE A DAY AS NEEDED   methocarbamol (ROBAXIN) 500 MG tablet, Take 1 tablet (500 mg total) by mouth every 6 (six) hours as needed for muscle spasms. (Patient not taking: Reported on 08/10/2020)   Reviewed prior external information including notes and imaging from  primary care provider As well as notes that were available from care everywhere and other healthcare systems.  Past medical history, social, surgical and family history all reviewed in electronic medical record.  No pertanent information unless stated regarding to the chief complaint.   Review of Systems:  No headache, visual changes, nausea, vomiting, diarrhea, constipation, dizziness, abdominal pain, skin rash, fevers, chills, night sweats, weight loss, swollen lymph nodes, body aches, joint swelling, chest pain, shortness of breath, mood changes. POSITIVE muscle aches  Objective  Blood pressure 110/70, pulse 68, height 5\' 11"  (1.803 m), weight 193 lb (87.5 kg), SpO2 95 %.   General: No apparent distress alert and oriented x3 mood and affect normal, dressed appropriately.  HEENT: Pupils equal, extraocular movements intact  Respiratory: Patient's speak in full sentences and does not appear short of breath  Cardiovascular: No lower extremity edema, non tender, no erythema  Gait normal with good balance and  coordination.  MSK: Lumbar back exam shows the patient does have tenderness to palpation in the paraspinal musculature of the lumbar spine left greater than right.  Tightness with FABER test noted.  Negative straight leg test bilaterally.  Patient does have 5-5 strength of the lower extremities.  Patient does have significant stiffness noted with any type of extension of the back.  Osteopathic findings T8 extended rotated and side bent left L2 flexed rotated and side bent right ribs L4 flexed rotated and side bent left Sacrum left on left    Impression and Recommendations:     The above documentation has been reviewed and is accurate and complete , DO

## 2020-08-10 ENCOUNTER — Other Ambulatory Visit: Payer: Self-pay

## 2020-08-10 ENCOUNTER — Ambulatory Visit (INDEPENDENT_AMBULATORY_CARE_PROVIDER_SITE_OTHER): Payer: BC Managed Care – PPO | Admitting: Family Medicine

## 2020-08-10 ENCOUNTER — Encounter: Payer: Self-pay | Admitting: Family Medicine

## 2020-08-10 VITALS — BP 110/70 | HR 68 | Ht 71.0 in | Wt 193.0 lb

## 2020-08-10 DIAGNOSIS — M9902 Segmental and somatic dysfunction of thoracic region: Secondary | ICD-10-CM | POA: Diagnosis not present

## 2020-08-10 DIAGNOSIS — M9903 Segmental and somatic dysfunction of lumbar region: Secondary | ICD-10-CM

## 2020-08-10 DIAGNOSIS — M9904 Segmental and somatic dysfunction of sacral region: Secondary | ICD-10-CM | POA: Diagnosis not present

## 2020-08-10 DIAGNOSIS — M5442 Lumbago with sciatica, left side: Secondary | ICD-10-CM | POA: Diagnosis not present

## 2020-08-10 DIAGNOSIS — G8929 Other chronic pain: Secondary | ICD-10-CM

## 2020-08-10 NOTE — Assessment & Plan Note (Signed)
Patient's MRI is consistent with the possibility of an L4-L5 spinal stenosis that is moderate in severity.  Patient also has a left lateral recess that could be potentially giving some discomfort.  Attempted osteopathic manipulation today and will start with over-the-counter vitamin supplements.  Discussed icing regimen.  Discussed proper lifting technique.  Patient at this point will continue with this and see me again in 5 to 6 weeks to continue manipulation.  Worsening pain can consider the possibility of epidural.  Other idea would be the possibility of changing some of his medications but would like to avoid that if possible.  Follow-up again in 5

## 2020-08-10 NOTE — Patient Instructions (Addendum)
Good to see you  Low back exercise given ice 20 minutes 2 times daily. Usually after activity and before bed. Once weekly vitamin D for 12 weeks.  Turmeric 500mg  daily  Tart cherry extract 1200mg  at night Vitamin D 2000 IU daily  See me again in 5-6 weeks to continue OMT

## 2020-08-10 NOTE — Assessment & Plan Note (Signed)

## 2020-08-12 ENCOUNTER — Other Ambulatory Visit: Payer: Self-pay | Admitting: Family Medicine

## 2020-08-14 MED ORDER — LISDEXAMFETAMINE DIMESYLATE 50 MG PO CAPS: 50.0000 mg | ORAL_CAPSULE | Freq: Every day | ORAL | 0 refills | Status: DC

## 2020-08-16 ENCOUNTER — Telehealth: Payer: Self-pay | Admitting: *Deleted

## 2020-08-16 NOTE — Telephone Encounter (Signed)
Received request from pharmacy for PA on Vyvanse.  PA submitted.   Dx: F90.9- ADHD  Your information has been submitted to [facility]. [employer] [place] will review the request and notify you of the determination decision directly, typically within 72 hours of receiving all information.  You will also receive your request decision electronically. To check for an update later, open this request again from your dashboard.  If [employer] [facility] has not responded within the specified timeframe or if you have any questions about your PA submission, contact [employer]  directly at [phone].

## 2020-08-18 ENCOUNTER — Other Ambulatory Visit: Payer: Self-pay | Admitting: *Deleted

## 2020-08-18 ENCOUNTER — Encounter: Payer: Self-pay | Admitting: Family Medicine

## 2020-08-18 MED ORDER — LEVOTHYROXINE SODIUM 50 MCG PO TABS: 50.0000 ug | ORAL_TABLET | Freq: Every day | ORAL | 1 refills | Status: DC

## 2020-08-21 NOTE — Telephone Encounter (Signed)
Received PA determination.   PA approved 08/16/2020 through 08/15/2021.

## 2020-09-14 DIAGNOSIS — F331 Major depressive disorder, recurrent, moderate: Secondary | ICD-10-CM | POA: Diagnosis not present

## 2020-09-18 ENCOUNTER — Encounter: Payer: Self-pay | Admitting: Family Medicine

## 2020-09-18 MED ORDER — LISDEXAMFETAMINE DIMESYLATE 50 MG PO CAPS: 50.0000 mg | ORAL_CAPSULE | Freq: Every day | ORAL | 0 refills | Status: DC

## 2020-09-18 NOTE — Telephone Encounter (Signed)
Ok to refill??  Last office visit 06/05/2020.  Last refill 08/14/2020.

## 2020-09-20 NOTE — Progress Notes (Signed)
[name] [address] [place] [address] Phone: [phone] Subjective:    I'm seeing this patient by the request  of:  Terry Brooks, MD  I, Terry [name], LAT, ATC acting as a scribe for [employer], DO.   CC: Low back and neck pain follow-up  [id number]  Terry [name] is a 35 y.o. male coming in with complaint of back and neck pain. OMT 08/10/2020. Patient states back and neck are OK today, but over the past 1-2 week was experiencing some pain. Pt is having some numbness/tingling into L thigh.  States that it was constant initially but seems to be more intermittent now  Medications patient has been prescribed: None  Taking:         Reviewed prior external information including notes and imaging from previsou exam, outside providers and external EMR if available.  This includes patient's MRI from October 2021 showing the patient did have epidural lipomatosis as well as mild spinal stenosis at L3-L4 and moderate spinal stenosis at L4-L5  As well as notes that were available from care everywhere and other healthcare systems.  Past medical history, social, surgical and family history all reviewed in electronic medical record.  No pertanent information unless stated regarding to the chief complaint.   Past Medical History:  Diagnosis Date   Acute appendicitis 02/08/2013   ADHD (attention deficit hyperactivity disorder)    Allergic rhinitis    Appendicitis, acute 02/08/2013   Depression    since 8th grade, hospitalized 2015 ([place])   HSV infection     No Known Allergies   Review of Systems:  No headache, visual changes, nausea, vomiting, diarrhea, constipation, dizziness, abdominal pain, skin rash, fevers, chills, night sweats, weight loss, swollen lymph nodes, body aches, joint swelling, chest pain, shortness of breath, mood changes. POSITIVE muscle aches  Objective  Blood pressure 138/82, pulse 61, height 5\' 11"  (1.803  m), weight 193 lb 9.6 oz (87.8 kg), SpO2 97 %.   General: No apparent distress alert and oriented x3 mood and affect normal, dressed appropriately.  HEENT: Pupils equal, extraocular movements intact  Respiratory: Patient's speak in full sentences and does not appear short of breath  Cardiovascular: No lower extremity edema, non tender, no erythema  Low back exam does have some mild loss of lordosis.  Some tenderness to palpation of the paraspinal musculature.  Tightness mostly in the L4-L5 area bilaterally right greater than left.  Osteopathic findings  C5 flexed rotated and side bent left T5 extended rotated and side bent right inhaled rib L4 flexed rotated and side bent right Sacrum right on right       Assessment and Plan: Chronic left-sided low back pain with left-sided sciatica Acute on chronic pain.  Chronic problem.  Patient has responded well to manipulation previously and hopefully well again.  Discussed the meloxicam.  Discussed home exercises.  Patient does not have any radicular symptoms at this point but states that it does seem to be intermittent like that.  Follow-up with me again in 6 weeks    Nonallopathic problems  Decision today to treat with OMT was based on Physical Exam  After verbal consent patient was treated with HVLA, ME, FPR techniques in cervical, rib, thoracic, lumbar, and sacral  areas  Patient tolerated the procedure well with improvement in symptoms  Patient given exercises, stretches and lifestyle modifications  See medications in patient instructions if given  Patient will follow up in 4-8 weeks  The above documentation has been reviewed and is accurate and complete Terry Pulley, DO       Note: This dictation was prepared with Dragon dictation along with smaller phrase technology. Any transcriptional errors that result from this process are unintentional.

## 2020-09-21 ENCOUNTER — Other Ambulatory Visit: Payer: Self-pay

## 2020-09-21 ENCOUNTER — Encounter: Payer: Self-pay | Admitting: Family Medicine

## 2020-09-21 ENCOUNTER — Ambulatory Visit (INDEPENDENT_AMBULATORY_CARE_PROVIDER_SITE_OTHER): Payer: BC Managed Care – PPO | Admitting: Family Medicine

## 2020-09-21 VITALS — BP 138/82 | HR 61 | Ht 71.0 in | Wt 193.6 lb

## 2020-09-21 DIAGNOSIS — M9903 Segmental and somatic dysfunction of lumbar region: Secondary | ICD-10-CM

## 2020-09-21 DIAGNOSIS — M5442 Lumbago with sciatica, left side: Secondary | ICD-10-CM

## 2020-09-21 DIAGNOSIS — M9904 Segmental and somatic dysfunction of sacral region: Secondary | ICD-10-CM | POA: Diagnosis not present

## 2020-09-21 DIAGNOSIS — M9908 Segmental and somatic dysfunction of rib cage: Secondary | ICD-10-CM | POA: Diagnosis not present

## 2020-09-21 DIAGNOSIS — M9902 Segmental and somatic dysfunction of thoracic region: Secondary | ICD-10-CM | POA: Diagnosis not present

## 2020-09-21 DIAGNOSIS — F331 Major depressive disorder, recurrent, moderate: Secondary | ICD-10-CM | POA: Diagnosis not present

## 2020-09-21 DIAGNOSIS — M9901 Segmental and somatic dysfunction of cervical region: Secondary | ICD-10-CM

## 2020-09-21 DIAGNOSIS — G8929 Other chronic pain: Secondary | ICD-10-CM

## 2020-09-21 NOTE — Patient Instructions (Signed)
Good to see you  We will watch the back and SI joint  See me again in 4-6 weeks

## 2020-09-21 NOTE — Assessment & Plan Note (Signed)
Acute on chronic pain.  Chronic problem.  Patient has responded well to manipulation previously and hopefully well again.  Discussed the meloxicam.  Discussed home exercises.  Patient does not have any radicular symptoms at this point but states that it does seem to be intermittent like that.  Follow-up with me again in 6 weeks

## 2020-09-27 DIAGNOSIS — F331 Major depressive disorder, recurrent, moderate: Secondary | ICD-10-CM | POA: Diagnosis not present

## 2020-09-30 ENCOUNTER — Other Ambulatory Visit: Payer: Self-pay | Admitting: Family Medicine

## 2020-10-11 DIAGNOSIS — F331 Major depressive disorder, recurrent, moderate: Secondary | ICD-10-CM | POA: Diagnosis not present

## 2020-10-18 DIAGNOSIS — F331 Major depressive disorder, recurrent, moderate: Secondary | ICD-10-CM | POA: Diagnosis not present

## 2020-10-21 NOTE — Therapy (Signed)
[place], [place], [address] Phone: [phone]   Fax:  [phone]  Physical Therapy Treatment/Discharge  Patient Details  Name: Terry [name] MRN: [id number] Date of Birth: 11-Dec-1985 Referring Provider (PT): Melvenia Beam, MD    Encounter Date: 05/16/2020    Past Medical History:  Diagnosis Date   Acute appendicitis 02/08/2013   ADHD (attention deficit hyperactivity disorder)    Allergic rhinitis    Appendicitis, acute 02/08/2013   Depression    since 8th grade, hospitalized 2015 ([place])   HSV infection     Past Surgical History:  Procedure Laterality Date   APPENDECTOMY     2013   LAPAROSCOPIC APPENDECTOMY N/A 02/08/2013   Procedure: APPENDECTOMY LAPAROSCOPIC;  Surgeon: Earnstine Regal, MD;  Location: [facility];  Service: General;  Laterality: N/A;   WISDOM TOOTH EXTRACTION      There were no vitals filed for this visit.                                PT Short Term Goals - 05/16/20 1807       PT SHORT TERM GOAL #1   Title Pt will be independent with initial HEP.    Baseline met    Time 3    Period Weeks    Status Achieved      PT SHORT TERM GOAL #2   Title Pt will have 25% decreased pain upon waking up in the morning.    Baseline met    Time 3    Period Weeks    Status Achieved      PT SHORT TERM GOAL #3   Title Pt's FOTO score will improve from 26% limited to 23% limited or better.    Baseline 28% limited 05/16/20    Time 3    Period Weeks    Status On-going               PT Long Term Goals - 05/16/20 1808       PT LONG TERM GOAL #1   Title Pt will be independent with advanced HEP.    Baseline met    Time 6    Period Weeks    Status Achieved      PT LONG TERM GOAL #2   Title Pt will improve L hip FL MMT to 4/5 or greater.    Baseline met    Time 6    Period Weeks    Status Achieved      PT LONG TERM GOAL #3   Title Pt's FOTO  score will improve from 26% limited to 20% limited or better.    Baseline 28% limited/72% function    Time 6    Period Weeks    Status On-going                    Patient will benefit from skilled therapeutic intervention in order to improve the following deficits and impairments:  Decreased activity tolerance, Decreased strength, Decreased endurance, Pain  Visit Diagnosis: Chronic left-sided low back pain with left-sided sciatica  Pain in left leg  Muscle weakness (generalized)  Difficulty in walking, not elsewhere classified     Problem List Patient Active Problem List   Diagnosis Date Noted   Somatic dysfunction of spine, lumbar 08/10/2020   Chronic left-sided low back pain with left-sided sciatica 12/01/2019   Genital herpes simplex  09/24/2018   Overweight (BMI 25.0-29.9) 09/24/2018   Bipolar affective disorder, current episode depressed ([place]) 09/24/2018   Generalized anxiety disorder 09/24/2018   Social anxiety disorder 09/24/2018   Insomnia 09/24/2018   Recurrent major depression resistant to treatment ([place]) 09/24/2018   ADD (attention deficit disorder) without hyperactivity 09/24/2018   Elevated liver enzymes 09/24/2018   Drug-induced erectile dysfunction 09/24/2018   ATTENTION DEFICIT HYPERACTIVITY DISORDER 03/04/2007   Seasonal and perennial allergic rhinitis 03/04/2007         PHYSICAL THERAPY DISCHARGE SUMMARY  Visits from Start of Care: 12  Current functional level related to goals / functional outcomes: See therapy goals which were partially met. No further therapy attended after last session 05/16/20-d/c to HEP   Remaining deficits: Current status unknown   Education / Equipment: HEP   Patient agrees to discharge. Patient goals were partially met. Patient is being discharged due to meeting the stated rehab goals.   Beaulah Dinning, PT, DPT 10/21/20 10:58 AM     [facility] [address] [place], [place], [address] Phone: [phone]   Fax:  [phone]  Name: Terry [name] MRN: [id number] Date of Birth: [birth year]

## 2020-10-25 ENCOUNTER — Other Ambulatory Visit: Payer: Self-pay

## 2020-10-25 ENCOUNTER — Ambulatory Visit (INDEPENDENT_AMBULATORY_CARE_PROVIDER_SITE_OTHER): Payer: BC Managed Care – PPO | Admitting: Family Medicine

## 2020-10-25 VITALS — BP 120/70 | HR 73 | Ht 71.0 in

## 2020-10-25 DIAGNOSIS — M9904 Segmental and somatic dysfunction of sacral region: Secondary | ICD-10-CM | POA: Diagnosis not present

## 2020-10-25 DIAGNOSIS — M9903 Segmental and somatic dysfunction of lumbar region: Secondary | ICD-10-CM | POA: Diagnosis not present

## 2020-10-25 DIAGNOSIS — M9901 Segmental and somatic dysfunction of cervical region: Secondary | ICD-10-CM

## 2020-10-25 DIAGNOSIS — M5442 Lumbago with sciatica, left side: Secondary | ICD-10-CM | POA: Diagnosis not present

## 2020-10-25 DIAGNOSIS — G8929 Other chronic pain: Secondary | ICD-10-CM

## 2020-10-25 DIAGNOSIS — M9908 Segmental and somatic dysfunction of rib cage: Secondary | ICD-10-CM

## 2020-10-25 DIAGNOSIS — M9902 Segmental and somatic dysfunction of thoracic region: Secondary | ICD-10-CM

## 2020-10-25 DIAGNOSIS — F331 Major depressive disorder, recurrent, moderate: Secondary | ICD-10-CM | POA: Diagnosis not present

## 2020-10-25 NOTE — Assessment & Plan Note (Signed)
Continued tightness on the left side.  No significant radicular symptoms.  Discussed with patient again at this time.  Discussed home exercises and icing regimen.  Increase activity slowly.  Follow-up with me again 6 to 8 weeks.

## 2020-10-25 NOTE — Progress Notes (Signed)
[name] [address] [place] [address] Phone: [phone] Subjective:   [name], am serving as a scribe for Dr. Antoine Primas.  This visit occurred during the SARS-CoV-2 public health emergency.  Safety protocols were in place, including screening questions prior to the visit, additional usage of staff PPE, and extensive cleaning of exam room while observing appropriate contact time as indicated for disinfecting solutions.   I'm seeing this patient by the request  of:  Terry Brooks, MD  CC: Back and neck pain follow-up  [id number]  Terry [name] is a 35 y.o. male coming in with complaint of back and neck pain. OMT 09/21/2020. Patient states the back pain is better but acts up every once in a while.  Most the pain still seems to be on the lower back.  Still seems to be more in the thoracic area as well as the left sacrum denies any numbness in the next contributing to any movement discomfort at the moment.  Medications patient has been prescribed: None  Taking:         Reviewed prior external information including notes and imaging from previsou exam, outside providers and external EMR if available.   As well as notes that were available from care everywhere and other healthcare systems.  Past medical history, social, surgical and family history all reviewed in electronic medical record.  No pertanent information unless stated regarding to the chief complaint.   Past Medical History:  Diagnosis Date   Acute appendicitis 02/08/2013   ADHD (attention deficit hyperactivity disorder)    Allergic rhinitis    Appendicitis, acute 02/08/2013   Depression    since 8th grade, hospitalized 2015 ([place])   HSV infection     No Known Allergies   Review of Systems:  No headache, visual changes, nausea, vomiting, diarrhea, constipation, dizziness, abdominal pain, skin rash, fevers, chills, night sweats, weight loss, swollen  lymph nodes, body aches, joint swelling, chest pain, shortness of breath, mood changes. POSITIVE muscle aches  Objective  There were no vitals taken for this visit.   General: No apparent distress alert and oriented x3 mood and affect normal, dressed appropriately.  HEENT: Pupils equal, extraocular movements intact  Respiratory: Patient's speak in full sentences and does not appear short of breath  Cardiovascular: No lower extremity edema, non tender, no erythema  Low back exam does have some mild loss of lordosis.  Some tenderness to palpation in the paraspinal musculature.  Tightness with FABER test bilaterally.  Osteopathic findings  C6 flexed rotated and side bent left T3 extended rotated and side bent right inhaled rib T9 extended rotated and side bent left L2 flexed rotated and side bent right L4 flexed rotated and side bent right Sacrum right on right   Previous MRI did show that patient had mild spinal stenosis at L3-L4 and moderate spinal stenosis at L4-L5    Assessment and Plan:  Chronic left-sided low back pain with left-sided sciatica Continued tightness on the left side.  No significant radicular symptoms.  Discussed with patient again at this time.  Discussed home exercises and icing regimen.  Increase activity slowly.  Follow-up with me again 6 to 8 weeks.   Nonallopathic problems  Decision today to treat with OMT was based on Physical Exam  After verbal consent patient was treated with HVLA, ME, FPR techniques in cervical, rib, thoracic, lumbar, and sacral  areas  Patient tolerated the procedure well with improvement in symptoms  Patient given exercises, stretches and lifestyle modifications  See medications in patient instructions if given  Patient will follow up in 4-8 weeks      The above documentation has been reviewed and is accurate and complete Terry [name]       Note: This dictation was prepared with Dragon dictation along with smaller phrase  technology. Any transcriptional errors that result from this process are unintentional.

## 2020-10-25 NOTE — Patient Instructions (Signed)
Keep doing exercises See you again in 7-8 weeks

## 2020-10-27 ENCOUNTER — Encounter: Payer: Self-pay | Admitting: Family Medicine

## 2020-10-27 MED ORDER — LISDEXAMFETAMINE DIMESYLATE 50 MG PO CAPS: 50.0000 mg | ORAL_CAPSULE | Freq: Every day | ORAL | 0 refills | Status: DC

## 2020-10-27 NOTE — Telephone Encounter (Signed)
Ok to refill??  Last office visit 06/05/2020.  Last refill 09/18/2020.

## 2020-11-02 DIAGNOSIS — F331 Major depressive disorder, recurrent, moderate: Secondary | ICD-10-CM | POA: Diagnosis not present

## 2020-11-06 ENCOUNTER — Other Ambulatory Visit: Payer: Self-pay | Admitting: Family Medicine

## 2020-11-06 DIAGNOSIS — F331 Major depressive disorder, recurrent, moderate: Secondary | ICD-10-CM | POA: Diagnosis not present

## 2020-11-07 ENCOUNTER — Ambulatory Visit (INDEPENDENT_AMBULATORY_CARE_PROVIDER_SITE_OTHER): Payer: BC Managed Care – PPO | Admitting: Family Medicine

## 2020-11-07 ENCOUNTER — Telehealth: Payer: Self-pay

## 2020-11-07 ENCOUNTER — Other Ambulatory Visit: Payer: Self-pay

## 2020-11-07 ENCOUNTER — Encounter: Payer: Self-pay | Admitting: Family Medicine

## 2020-11-07 VITALS — BP 116/70 | HR 76 | Ht 71.0 in | Wt 187.2 lb

## 2020-11-07 DIAGNOSIS — F322 Major depressive disorder, single episode, severe without psychotic features: Secondary | ICD-10-CM

## 2020-11-07 DIAGNOSIS — F411 Generalized anxiety disorder: Secondary | ICD-10-CM | POA: Diagnosis not present

## 2020-11-07 MED ORDER — CLONAZEPAM 0.5 MG PO TABS: 0.5000 mg | ORAL_TABLET | Freq: Two times a day (BID) | ORAL | 1 refills | Status: AC | PRN

## 2020-11-07 NOTE — Telephone Encounter (Signed)
Is this okay to refill  Last ov 06/05/20 Last filled dated 06/16/20 qty 90

## 2020-11-07 NOTE — Progress Notes (Signed)
Subjective:    Patient ID: Terry [name], male    DOB: February 07, 1986, 35 y.o.   MRN: [id number] Patient states that his depression has worsened again.  He states that he is overwhelmed with anxiety.  He feels constantly anxious to the point that his body starts to shake for no reason.  He has nervous energy and is constantly wringing his hands.  He denies full-blown panic attacks.  He denies suicidal ideation but he does report anhedonia and depression.  He states that life is not very good right now.  He is under some financial strain.  He hates his job.  He states that his relationship is not doing well.  His parents are still very supportive and are a positive influence in his life.  They are very helpful and supportive of him.  He denies any financial hardships.  We discussed using Klonopin however he is very concerned about becoming addictive or dependent to benzodiazepines.  He believes that he has not tried Rexulti.  He has tried and failed numerous other medications including Vraylar, Latuda, Abilify.  He is currently on Zyprexa.  He is tried numerous SSRIs and SNRIs.  The combination of Prozac and Zyprexa have given him the most benefit that he can recall.  He does have an appointment to meet with a psychiatrist however at some 5 weeks and he states he is not confident he can wait that long as he just feels overwhelmed Past Medical History:  Diagnosis Date   Acute appendicitis 02/08/2013   ADHD (attention deficit hyperactivity disorder)    Allergic rhinitis    Appendicitis, acute 02/08/2013   Depression    since 8th grade, hospitalized 2015 ([place])   HSV infection    Past Surgical History:  Procedure Laterality Date   APPENDECTOMY     2013   LAPAROSCOPIC APPENDECTOMY N/A 02/08/2013   Procedure: APPENDECTOMY LAPAROSCOPIC;  Surgeon: Velora Heckler, MD;  Location: [facility];  Service: General;  Laterality: N/A;   WISDOM TOOTH EXTRACTION     Current Outpatient Medications on File Prior to  Visit  Medication Sig Dispense Refill   FLUoxetine (PROZAC) 20 MG capsule TAKE 1 CAPSULE BY MOUTH EVERY DAY 90 capsule 2   levothyroxine (SYNTHROID) 50 MCG tablet Take 1 tablet (50 mcg total) by mouth daily. 90 tablet 1   lisdexamfetamine (VYVANSE) 50 MG capsule Take 1 capsule (50 mg total) by mouth daily. 30 capsule 0   meloxicam (MOBIC) 15 MG tablet TAKE 1 TABLET (15 MG TOTAL) BY MOUTH DAILY. 30 tablet 0   methocarbamol (ROBAXIN) 500 MG tablet Take 1 tablet (500 mg total) by mouth every 6 (six) hours as needed for muscle spasms. 120 tablet 6   OLANZapine (ZYPREXA) 5 MG tablet TAKE 1 TABLET BY MOUTH EVERYDAY AT BEDTIME 90 tablet 1   sildenafil (VIAGRA) 100 MG tablet Take 0.5-1 tablets (50-100 mg total) by mouth daily as needed for erectile dysfunction. 5 tablet 11   traZODone (DESYREL) 100 MG tablet TAKE 1 TABLET BY MOUTH EVERYDAY AT BEDTIME 90 tablet 0   valACYclovir (VALTREX) 1000 MG tablet TAKE 1 TABLET BY MOUTH TWICE A DAY AS NEEDED 30 tablet 0   No current facility-administered medications on file prior to visit.   No Known Allergies Social History   Socioeconomic History   Marital status: Single    Spouse name: Not on file   Number of children: 0   Years of education: Not on file   Highest education level: Bachelor's  degree (e.g., BA, AB, BS)  Occupational History   Occupation: works in garden department    Comment: [employer]   Tobacco Use   Smoking status: Former    Packs/day: 0.50    Years: 3.00    Pack years: 1.50    Types: Cigarettes    Quit date: 2009    Years since quitting: 13.7   Smokeless tobacco: Never  Vaping Use   Vaping Use: Never used  Substance and Sexual Activity   Alcohol use: Yes    Alcohol/week: 12.0 standard drinks    Types: 12 Cans of beer per week    Comment: week   Drug use: Yes    Frequency: 7.0 times per week    Types: Marijuana   Sexual activity: Not on file  Other Topics Concern   Not on file  Social History Narrative   Lives at home  alone   Right handed   Caffeine: 1 cup each morning    Social Determinants of Health   Financial Resource Strain: Not on file  Food Insecurity: Not on file  Transportation Needs: Not on file  Physical Activity: Not on file  Stress: Not on file  Social Connections: Not on file  Intimate Partner Violence: Not on file   Family History  Problem Relation Age of Onset   Allergic rhinitis Mother    Cancer Maternal Grandfather        bone and prostate     Review of Systems  All other systems reviewed and are negative.     Objective:   Physical Exam Vitals reviewed.  Constitutional:      General: He is not in acute distress.    Appearance: He is well-developed. He is not diaphoretic.  Cardiovascular:     Rate and Rhythm: Normal rate and regular rhythm.     Heart sounds: Normal heart sounds. No murmur heard.   No friction rub. No gallop.  Pulmonary:     Effort: Pulmonary effort is normal. No respiratory distress.     Breath sounds: Normal breath sounds. No wheezing or rales.  Chest:     Chest wall: No tenderness.  Neurological:     Motor: No weakness.     Coordination: Coordination normal.     Gait: Gait normal.     Deep Tendon Reflexes: Reflexes normal.  Psychiatric:        Speech: Speech normal.        Behavior: Behavior normal.        Thought Content: Thought content normal. Thought content is not paranoid or delusional. Thought content does not include homicidal or suicidal ideation. Thought content does not include homicidal or suicidal plan.        Judgment: Judgment normal.          Assessment & Plan:  Severe major depression without psychotic features (HCC)  Generalized anxiety disorder We had a long discussion today.  I feel that we first need to try to get some control over his anxiety so I recommended trying Klonopin 0.5 mg twice daily on a scheduled basis.  I would like to see the patient back in 1 week to see how he is doing.  Hopefully if he is feeling  better and more stable, at that time we can make the change from Zyprexa to Rexulti which may provide better long-term maintenance.  Once he is doing better from that standpoint I would like to wean him away from the Klonopin.  Ultimately I would like  him to see the psychiatrist as I feel that that is a more appropriate level of care for him than I am able to provide but in the meantime I want to try to help him as best I can

## 2020-11-08 DIAGNOSIS — F331 Major depressive disorder, recurrent, moderate: Secondary | ICD-10-CM | POA: Diagnosis not present

## 2020-11-09 ENCOUNTER — Other Ambulatory Visit: Payer: Self-pay | Admitting: Family Medicine

## 2020-11-09 NOTE — Telephone Encounter (Signed)
Is this okay to fill  Last ov 11/07/20  Dr Tanya Nones Last date filled 01/11/20

## 2020-11-14 DIAGNOSIS — F331 Major depressive disorder, recurrent, moderate: Secondary | ICD-10-CM | POA: Diagnosis not present

## 2020-11-14 DIAGNOSIS — F32A Depression, unspecified: Secondary | ICD-10-CM | POA: Diagnosis not present

## 2020-11-16 DIAGNOSIS — F331 Major depressive disorder, recurrent, moderate: Secondary | ICD-10-CM | POA: Diagnosis not present

## 2020-11-17 ENCOUNTER — Encounter: Payer: Self-pay | Admitting: Family Medicine

## 2020-11-20 DIAGNOSIS — F331 Major depressive disorder, recurrent, moderate: Secondary | ICD-10-CM | POA: Diagnosis not present

## 2020-11-21 NOTE — Progress Notes (Signed)
[name] [address] [place] [address] Phone: [phone] Subjective:   Terry [name], am serving as a scribe for Dr. Antoine [name]. This visit occurred during the SARS-CoV-2 public health emergency.  Safety protocols were in place, including screening questions prior to the visit, additional usage of staff PPE, and extensive cleaning of exam room while observing appropriate contact time as indicated for disinfecting solutions.   I'm seeing this patient by the request  of:  Terry Brooks, MD  CC:   [id number]  Terry [name] is a 35 y.o. male coming in with complaint of back and neck pain. OMT 10/07/2020. Patient states low back pain is still persistent and feels more irritating than usual. Feels like it's more prominent and catching at times.  Patient states that the manipulation was beneficial but only seem to last for short amount of time.  Continue to wean the meloxicam and the muscle relaxer fairly regularly. Patient did have previous imaging in October of last year showing the patient did have moderate spinal stenosis noted at L4-5.  This was independently visualized by me again today.         Reviewed prior external information including notes and imaging from previsou exam, outside providers and external EMR if available.  This includes the MRI that was discussed above.  As well as notes that were available from care everywhere and other healthcare systems.  Past medical history, social, surgical and family history all reviewed in electronic medical record.  No pertanent information unless stated regarding to the chief complaint.   Past Medical History:  Diagnosis Date   Acute appendicitis 02/08/2013   ADHD (attention deficit hyperactivity disorder)    Allergic rhinitis    Appendicitis, acute 02/08/2013   Depression    since 8th grade, hospitalized 2015 ([place])   HSV infection     No Known Allergies   Review of  Systems:  No headache, visual changes, nausea, vomiting, diarrhea, constipation, dizziness, abdominal pain, skin rash, fevers, chills, night sweats, weight loss, swollen lymph nodes, body aches, joint swelling, chest pain, shortness of breath, mood changes. POSITIVE muscle aches  Objective  Blood pressure (!) 106/54, pulse 70, height 5\' 11"  (1.803 m), weight 192 lb (87.1 kg), SpO2 98 %.   General: No apparent distress alert and oriented x3 mood and affect normal, dressed appropriately.  HEENT: Pupils equal, extraocular movements intact  Respiratory: Patient's speak in full sentences and does not appear short of breath  Cardiovascular: No lower extremity edema, non tender, no erythema  Low back exam does have some loss of lordosis and some tenderness to palpation in the paraspinal musculature.  Patient does have tightness more on the right sacroiliac joint.  Tightness of the paraspinal musculature right greater than left as well.  Osteopathic findings  C2 flexed rotated and side bent right C6 flexed rotated and side bent left T3 extended rotated and side bent right inhaled rib T9 extended rotated and side bent left L2 flexed rotated and side bent right Sacrum right on right       Assessment and Plan:  Chronic left-sided low back pain with left-sided sciatica Patient has had difficulty with left knee left-sided radicular symptoms previously.  I do believe that patient's spinal stenosis could well.  Patient points to pain over the sacroiliac joint area seem to be more on the right side than the left today.  We discussed different treatment options and patient is elected to continue with  the home exercises we will consider the possibility of an epidural for diagnostic and therapeutic purposes at the L4-L5 level where patient does have the worsening of the spinal stenosis.  Discussed with him we want to avoid other medications possible but possibly could consider taking the gabapentin again.   Follow-up with me again 4 to 6 weeks for further evaluation and likely osteopathic manipulation.   Nonallopathic problems  Decision today to treat with OMT was based on Physical Exam  After verbal consent patient was treated with HVLA, ME, FPR techniques in cervical, rib, thoracic, lumbar, and sacral  areas  Patient tolerated the procedure well with improvement in symptoms  Patient given exercises, stretches and lifestyle modifications  See medications in patient instructions if given  Patient will follow up in 4-8 weeks     The above documentation has been reviewed and is accurate and complete Terry Saa, DO        Note: This dictation was prepared with Dragon dictation along with smaller phrase technology. Any transcriptional errors that result from this process are unintentional.

## 2020-11-22 ENCOUNTER — Other Ambulatory Visit: Payer: Self-pay

## 2020-11-22 ENCOUNTER — Ambulatory Visit (INDEPENDENT_AMBULATORY_CARE_PROVIDER_SITE_OTHER): Payer: BC Managed Care – PPO | Admitting: Family Medicine

## 2020-11-22 ENCOUNTER — Encounter: Payer: Self-pay | Admitting: Family Medicine

## 2020-11-22 VITALS — BP 106/54 | HR 70 | Ht 71.0 in | Wt 192.0 lb

## 2020-11-22 DIAGNOSIS — M9901 Segmental and somatic dysfunction of cervical region: Secondary | ICD-10-CM | POA: Diagnosis not present

## 2020-11-22 DIAGNOSIS — G8929 Other chronic pain: Secondary | ICD-10-CM

## 2020-11-22 DIAGNOSIS — M9908 Segmental and somatic dysfunction of rib cage: Secondary | ICD-10-CM

## 2020-11-22 DIAGNOSIS — M5442 Lumbago with sciatica, left side: Secondary | ICD-10-CM | POA: Diagnosis not present

## 2020-11-22 DIAGNOSIS — M9902 Segmental and somatic dysfunction of thoracic region: Secondary | ICD-10-CM | POA: Diagnosis not present

## 2020-11-22 DIAGNOSIS — M9903 Segmental and somatic dysfunction of lumbar region: Secondary | ICD-10-CM | POA: Diagnosis not present

## 2020-11-22 DIAGNOSIS — F331 Major depressive disorder, recurrent, moderate: Secondary | ICD-10-CM | POA: Diagnosis not present

## 2020-11-22 DIAGNOSIS — M9904 Segmental and somatic dysfunction of sacral region: Secondary | ICD-10-CM

## 2020-11-22 NOTE — Assessment & Plan Note (Signed)
Patient has had difficulty with left knee left-sided radicular symptoms previously.  I do believe that patient's spinal stenosis could well.  Patient points to pain over the sacroiliac joint area seem to be more on the right side than the left today.  We discussed different treatment options and patient is elected to continue with the home exercises we will consider the possibility of an epidural for diagnostic and therapeutic purposes at the L4-L5 level where patient does have the worsening of the spinal stenosis.  Discussed with him we want to avoid other medications possible but possibly could consider taking the gabapentin again.  Follow-up with me again 4 to 6 weeks for further evaluation and likely osteopathic manipulation.

## 2020-11-22 NOTE — Patient Instructions (Signed)
Good to see you! Went a little more aggressive on manipulation Continue exercises If pain comes back in a week we can do a epidural See you again in 4-6 weeks

## 2020-11-27 DIAGNOSIS — F331 Major depressive disorder, recurrent, moderate: Secondary | ICD-10-CM | POA: Diagnosis not present

## 2020-11-29 DIAGNOSIS — F331 Major depressive disorder, recurrent, moderate: Secondary | ICD-10-CM | POA: Diagnosis not present

## 2020-12-04 DIAGNOSIS — F32A Depression, unspecified: Secondary | ICD-10-CM | POA: Diagnosis not present

## 2020-12-07 ENCOUNTER — Ambulatory Visit (INDEPENDENT_AMBULATORY_CARE_PROVIDER_SITE_OTHER): Payer: BC Managed Care – PPO | Admitting: Family Medicine

## 2020-12-07 ENCOUNTER — Other Ambulatory Visit: Payer: Self-pay

## 2020-12-07 ENCOUNTER — Encounter: Payer: Self-pay | Admitting: Family Medicine

## 2020-12-07 VITALS — BP 100/72 | HR 60 | Temp 98.3°F | Resp 14 | Ht 71.0 in | Wt 193.0 lb

## 2020-12-07 DIAGNOSIS — Z202 Contact with and (suspected) exposure to infections with a predominantly sexual mode of transmission: Secondary | ICD-10-CM | POA: Diagnosis not present

## 2020-12-07 DIAGNOSIS — E038 Other specified hypothyroidism: Secondary | ICD-10-CM

## 2020-12-07 MED ORDER — METRONIDAZOLE 500 MG PO TABS: 500.0000 mg | ORAL_TABLET | Freq: Two times a day (BID) | ORAL | 0 refills | Status: AC

## 2020-12-07 NOTE — Progress Notes (Signed)
Subjective:    Patient ID: Terry [name], male    DOB: October 27, 1985, 35 y.o.   MRN: [id number] Patient is a very pleasant 35 year old Caucasian gentleman here today to discuss treatment.  Since I last saw the patient, he is establish care with a psychiatrist.  Please see my last office visit.  Rather than start Rexulti, they continued Zyprexa and started the patient on Wellbutrin 150 mg daily.  He is now on 300 mg daily.  He states that he is only been on the 300 mg daily dose for 3 or 4 days but he does feel like he may be doing some better.  I am happy to hear that he is doing better and I certainly will defer to the psychiatrist expertise.  Any help I can be to the patient I will be glad to but I feel that he is in better hands than I was capable of.  He is due to recheck a TSH as we have not checked that since December.  Patient has had 1 partner.  He recently found out from the partner that she has trichomoniasis.  She has been treated.  He is here today for treatment.  He is completely asymptomatic.  He would like other STD screening however Past Medical History:  Diagnosis Date   Acute appendicitis 02/08/2013   ADHD (attention deficit hyperactivity disorder)    Allergic rhinitis    Appendicitis, acute 02/08/2013   Depression    since 8th grade, hospitalized 2015 ([place])   HSV infection    Past Surgical History:  Procedure Laterality Date   APPENDECTOMY     2013   LAPAROSCOPIC APPENDECTOMY N/A 02/08/2013   Procedure: APPENDECTOMY LAPAROSCOPIC;  Surgeon: Velora Heckler, MD;  Location: [facility];  Service: General;  Laterality: N/A;   WISDOM TOOTH EXTRACTION     Current Outpatient Medications on File Prior to Visit  Medication Sig Dispense Refill   buPROPion (WELLBUTRIN XL) 300 MG 24 hr tablet Take 300 mg by mouth daily.     clonazePAM (KLONOPIN) 0.5 MG tablet Take 1 tablet (0.5 mg total) by mouth 2 (two) times daily as needed for anxiety. 30 tablet 1   levothyroxine (SYNTHROID) 50 MCG  tablet Take 1 tablet (50 mcg total) by mouth daily. 90 tablet 1   lisdexamfetamine (VYVANSE) 50 MG capsule Take 1 capsule (50 mg total) by mouth daily. 30 capsule 0   meloxicam (MOBIC) 15 MG tablet TAKE 1 TABLET (15 MG TOTAL) BY MOUTH DAILY. 30 tablet 0   methocarbamol (ROBAXIN) 500 MG tablet Take 1 tablet (500 mg total) by mouth every 6 (six) hours as needed for muscle spasms. 120 tablet 6   OLANZapine (ZYPREXA) 5 MG tablet TAKE 1 TABLET BY MOUTH EVERYDAY AT BEDTIME 90 tablet 1   sildenafil (VIAGRA) 100 MG tablet Take 0.5-1 tablets (50-100 mg total) by mouth daily as needed for erectile dysfunction. 5 tablet 11   traZODone (DESYREL) 100 MG tablet TAKE 1 TABLET BY MOUTH EVERYDAY AT BEDTIME 90 tablet 0   valACYclovir (VALTREX) 1000 MG tablet TAKE 1 TABLET BY MOUTH TWICE A DAY AS NEEDED 30 tablet 0   No current facility-administered medications on file prior to visit.   No Known Allergies Social History   Socioeconomic History   Marital status: Single    Spouse name: Not on file   Number of children: 0   Years of education: Not on file   Highest education level: Bachelor's degree (e.g., BA, AB, BS)  Occupational History   Occupation: works in garden department    Comment: [employer]   Tobacco Use   Smoking status: Former    Packs/day: 0.50    Years: 3.00    Pack years: 1.50    Types: Cigarettes    Quit date: 2009    Years since quitting: 13.7   Smokeless tobacco: Never  Vaping Use   Vaping Use: Never used  Substance and Sexual Activity   Alcohol use: Yes    Alcohol/week: 12.0 standard drinks    Types: 12 Cans of beer per week    Comment: week   Drug use: Yes    Frequency: 7.0 times per week    Types: Marijuana   Sexual activity: Not on file  Other Topics Concern   Not on file  Social History Narrative   Lives at home alone   Right handed   Caffeine: 1 cup each morning    Social Determinants of Health   Financial Resource Strain: Not on file  Food Insecurity: Not on  file  Transportation Needs: Not on file  Physical Activity: Not on file  Stress: Not on file  Social Connections: Not on file  Intimate Partner Violence: Not on file   Family History  Problem Relation Age of Onset   Allergic rhinitis Mother    Cancer Maternal Grandfather        bone and prostate     Review of Systems  All other systems reviewed and are negative.     Objective:   Physical Exam Vitals reviewed.  Constitutional:      General: He is not in acute distress.    Appearance: He is well-developed. He is not diaphoretic.  Cardiovascular:     Rate and Rhythm: Normal rate and regular rhythm.     Heart sounds: Normal heart sounds. No murmur heard.   No friction rub. No gallop.  Pulmonary:     Effort: Pulmonary effort is normal. No respiratory distress.     Breath sounds: Normal breath sounds. No wheezing or rales.  Chest:     Chest wall: No tenderness.  Neurological:     Motor: No weakness.     Coordination: Coordination normal.     Gait: Gait normal.     Deep Tendon Reflexes: Reflexes normal.  Psychiatric:        Speech: Speech normal.        Behavior: Behavior normal.        Thought Content: Thought content normal. Thought content is not paranoid or delusional. Thought content does not include homicidal or suicidal ideation. Thought content does not include homicidal or suicidal plan.        Judgment: Judgment normal.          Assessment & Plan:  Other specified hypothyroidism - Plan: TSH  STD exposure - Plan: HIV Antibody (routine testing w rflx), C. trachomatis/N. gonorrhoeae RNA I am happy that the patient is established with a specialist.  I will defer to their management moving forward.  I will check a TSH today as this is overdue.  Given his exposure, I will treat the patient with Flagyl 500 mg twice daily for 7 days.  I counseled him to avoid all alcohol while on the antibiotic.  I will screen for HIV as well as gonorrhea and chlamydia.

## 2020-12-08 LAB — C. TRACHOMATIS/N. GONORRHOEAE RNA
C. trachomatis RNA, TMA: NOT DETECTED
N. gonorrhoeae RNA, TMA: NOT DETECTED

## 2020-12-08 LAB — TSH: TSH: 1.22 mIU/L (ref 0.40–4.50)

## 2020-12-08 LAB — HIV ANTIBODY (ROUTINE TESTING W REFLEX): HIV 1&2 Ab, 4th Generation: NONREACTIVE

## 2020-12-13 ENCOUNTER — Ambulatory Visit: Payer: BC Managed Care – PPO | Admitting: Family Medicine

## 2020-12-19 DIAGNOSIS — F410 Panic disorder [episodic paroxysmal anxiety] without agoraphobia: Secondary | ICD-10-CM | POA: Diagnosis not present

## 2020-12-19 DIAGNOSIS — F900 Attention-deficit hyperactivity disorder, predominantly inattentive type: Secondary | ICD-10-CM | POA: Diagnosis not present

## 2020-12-19 DIAGNOSIS — F332 Major depressive disorder, recurrent severe without psychotic features: Secondary | ICD-10-CM | POA: Diagnosis not present

## 2020-12-26 DIAGNOSIS — F332 Major depressive disorder, recurrent severe without psychotic features: Secondary | ICD-10-CM | POA: Diagnosis not present

## 2020-12-28 DIAGNOSIS — F332 Major depressive disorder, recurrent severe without psychotic features: Secondary | ICD-10-CM | POA: Diagnosis not present

## 2021-01-02 DIAGNOSIS — F332 Major depressive disorder, recurrent severe without psychotic features: Secondary | ICD-10-CM | POA: Diagnosis not present

## 2021-01-03 ENCOUNTER — Other Ambulatory Visit: Payer: Self-pay

## 2021-01-03 ENCOUNTER — Ambulatory Visit (INDEPENDENT_AMBULATORY_CARE_PROVIDER_SITE_OTHER): Payer: BC Managed Care – PPO | Admitting: Sports Medicine

## 2021-01-03 VITALS — BP 122/76 | HR 66 | Ht 71.0 in | Wt 193.0 lb

## 2021-01-03 DIAGNOSIS — M5442 Lumbago with sciatica, left side: Secondary | ICD-10-CM

## 2021-01-03 DIAGNOSIS — G8929 Other chronic pain: Secondary | ICD-10-CM

## 2021-01-03 DIAGNOSIS — G5702 Lesion of sciatic nerve, left lower limb: Secondary | ICD-10-CM | POA: Diagnosis not present

## 2021-01-03 DIAGNOSIS — M48061 Spinal stenosis, lumbar region without neurogenic claudication: Secondary | ICD-10-CM | POA: Diagnosis not present

## 2021-01-03 MED ORDER — MELOXICAM 15 MG PO TABS
15.0000 mg | ORAL_TABLET | Freq: Every day | ORAL | 0 refills | Status: DC
Start: 2021-01-03 — End: 2021-01-30

## 2021-01-03 NOTE — Patient Instructions (Signed)
Referral for PT sent  Home exercises given  4 week follow up

## 2021-01-03 NOTE — Progress Notes (Addendum)
[name] [address] [place] [address] Phone: [phone]   Assessment and Plan:    1. Chronic left-sided low back pain with left-sided sciatica 2. Spinal stenosis of lumbar region, unspecified whether neurogenic claudication present 3. Piriformis syndrome of left side -Chronic with exacerbation, subsequent visit - Recurrence of typical low back pain with radicular symptoms in the left leg.  On physical exam patient symptoms appear worst at gluteal musculature and piriformis on left side, so we will target therapies towards relieving tension at this area to see if his pain improves. - Patient does have moderate spinal stenosis at L4-5, so if treatment towards piriformis and gluteal musculature is ineffective, could discuss further treatments at lumbar spine including epidurals - Patient feels like he only gets about 24 hours relief from OMT, so we decided to not repeat today.  Could perform OMT at follow-up visit - Start meloxicam 15 mg daily x2 to 3 weeks and then return to using medication as needed for pain control -Start physical therapy and HEP targeted at gluteal musculature, piriformis syndrome  Pertinent previous records reviewed include lumbar MRI, lumbar x-ray   Follow Up: 4 weeks for reevaluation.  Would continue current plan if improvement, or would change to treating lumbar spine if no improvement or worsening of symptoms.   Subjective:    Chief Complaint: Back and neck pain  HPI: Pt is a 35 y/o male c/o back and neck pain. Pt's prior imaging revealed moderate spinal stenosis at L4-5. Pt was last seen by Dr. Katrinka Blazing on 11/22/20 for OMT. Today, pt reports most of his pain is in his low back w/ intermittent "catching." Pt is also interested in discussing treatment options like dry needling and/or injections. Pt locates pain to L-side of his low back. Numbness/tingling present over L anterior-lateral aspect of his thigh.   Dx  imaging: L-spine MRI  02/06/18 L-spine XR   Relevant Historical Information: None pertinent  Additional pertinent review of systems negative.  Current Outpatient Medications  Medication Sig Dispense Refill   buPROPion (WELLBUTRIN XL) 300 MG 24 hr tablet Take 300 mg by mouth daily.     clonazePAM (KLONOPIN) 0.5 MG tablet Take 1 tablet (0.5 mg total) by mouth 2 (two) times daily as needed for anxiety. 30 tablet 1   levothyroxine (SYNTHROID) 50 MCG tablet Take 1 tablet (50 mcg total) by mouth daily. 90 tablet 1   lisdexamfetamine (VYVANSE) 50 MG capsule Take 1 capsule (50 mg total) by mouth daily. 30 capsule 0   meloxicam (MOBIC) 15 MG tablet Take 1 tablet (15 mg total) by mouth daily. 30 tablet 0   methocarbamol (ROBAXIN) 500 MG tablet Take 1 tablet (500 mg total) by mouth every 6 (six) hours as needed for muscle spasms. 120 tablet 6   OLANZapine (ZYPREXA) 5 MG tablet TAKE 1 TABLET BY MOUTH EVERYDAY AT BEDTIME 90 tablet 1   sildenafil (VIAGRA) 100 MG tablet Take 0.5-1 tablets (50-100 mg total) by mouth daily as needed for erectile dysfunction. 5 tablet 11   traZODone (DESYREL) 100 MG tablet TAKE 1 TABLET BY MOUTH EVERYDAY AT BEDTIME 90 tablet 0   valACYclovir (VALTREX) 1000 MG tablet TAKE 1 TABLET BY MOUTH TWICE A DAY AS NEEDED 30 tablet 0   No current facility-administered medications for this visit.      Objective:     Vitals:   01/03/21 0759  BP: 122/76  Pulse: 66  SpO2: 98%  Weight: 193 lb (87.5  kg)  Height: 5\' 11"  (1.803 m)      Body mass index is 26.92 kg/m.    Physical Exam:     Gen: Appears well, nad, nontoxic and pleasant Psych: Alert and oriented, appropriate mood and affect Neuro: sensation intact, strength is 5/5 in upper and lower extremities, muscle tone wnl Skin: no susupicious lesions or rashes  Back - Normal skin, Spine with normal alignment and no deformity.   No tenderness to vertebral process palpation.   Paraspinous muscles are not tender and without  spasm Straight leg raise negative Trendelenberg negative Positive piriformis test on left TTP gluteal musculature, worse over piriformis on left  Electronically signed by:  [name] 8:18 AM 01/03/21

## 2021-01-04 DIAGNOSIS — F332 Major depressive disorder, recurrent severe without psychotic features: Secondary | ICD-10-CM | POA: Diagnosis not present

## 2021-01-09 DIAGNOSIS — F332 Major depressive disorder, recurrent severe without psychotic features: Secondary | ICD-10-CM | POA: Diagnosis not present

## 2021-01-11 DIAGNOSIS — F332 Major depressive disorder, recurrent severe without psychotic features: Secondary | ICD-10-CM | POA: Diagnosis not present

## 2021-01-15 DIAGNOSIS — F332 Major depressive disorder, recurrent severe without psychotic features: Secondary | ICD-10-CM | POA: Diagnosis not present

## 2021-01-17 DIAGNOSIS — F332 Major depressive disorder, recurrent severe without psychotic features: Secondary | ICD-10-CM | POA: Diagnosis not present

## 2021-01-23 DIAGNOSIS — F332 Major depressive disorder, recurrent severe without psychotic features: Secondary | ICD-10-CM | POA: Diagnosis not present

## 2021-01-25 DIAGNOSIS — F332 Major depressive disorder, recurrent severe without psychotic features: Secondary | ICD-10-CM | POA: Diagnosis not present

## 2021-01-26 ENCOUNTER — Other Ambulatory Visit: Payer: Self-pay

## 2021-01-26 ENCOUNTER — Encounter: Payer: Self-pay | Admitting: Nurse Practitioner

## 2021-01-26 ENCOUNTER — Ambulatory Visit: Payer: BC Managed Care – PPO | Admitting: Family Medicine

## 2021-01-26 ENCOUNTER — Ambulatory Visit (INDEPENDENT_AMBULATORY_CARE_PROVIDER_SITE_OTHER): Payer: BC Managed Care – PPO | Admitting: Nurse Practitioner

## 2021-01-26 VITALS — BP 134/70 | HR 70 | Ht 71.0 in | Wt 193.4 lb

## 2021-01-26 DIAGNOSIS — R0789 Other chest pain: Secondary | ICD-10-CM

## 2021-01-26 NOTE — Progress Notes (Signed)
Subjective:    Patient ID: Terry [name], male    DOB: 01-19-1986, 35 y.o.   MRN: [id number]  HPI: Terry [name] is a 35 y.o. male presenting for chest pain.  Chief Complaint  Patient presents with   Follow-up    Chest pain while playing basketball    CHEST PAIN Time since onset: no pain right now; first occurred 2-3 weeks ago Duration: 2-3 weeks ago Onset: gradual Quality: "blood bulging", heart ripping out of chest  Severity: severe Location: left sternal border Radiation: none Episode duration: minutes - mild pain lasted hours Frequency: has happened twice - only when playing baskeyball Related to exertion: yes Activity when pain started: playing basketball Trauma: no Anxiety/recent stressors: no Aggravating factors: playing basketball Alleviating factors: rest Status: better Treatments attempted: ibuprofen  Current pain status: no pain Shortness of breath: no Cough: no Nausea: no Vomiting: no Diaphoresis: was sweaty from physical activity; no cold sweat Heartburn: no Palpitations: yes - smart watch showed 120s or more during  Personal history of CAD: no Family history of CAD: no  No Known Allergies  Outpatient Encounter Medications as of 01/26/2021  Medication Sig   buPROPion (WELLBUTRIN XL) 300 MG 24 hr tablet Take 300 mg by mouth daily.   clonazePAM (KLONOPIN) 0.5 MG tablet Take 1 tablet (0.5 mg total) by mouth 2 (two) times daily as needed for anxiety.   levothyroxine (SYNTHROID) 50 MCG tablet Take 1 tablet (50 mcg total) by mouth daily.   lisdexamfetamine (VYVANSE) 50 MG capsule Take 1 capsule (50 mg total) by mouth daily.   meloxicam (MOBIC) 15 MG tablet Take 1 tablet (15 mg total) by mouth daily.   methocarbamol (ROBAXIN) 500 MG tablet Take 1 tablet (500 mg total) by mouth every 6 (six) hours as needed for muscle spasms.   OLANZapine (ZYPREXA) 5 MG tablet TAKE 1 TABLET BY MOUTH EVERYDAY AT BEDTIME   sildenafil (VIAGRA) 100 MG tablet Take 0.5-1  tablets (50-100 mg total) by mouth daily as needed for erectile dysfunction.   traZODone (DESYREL) 100 MG tablet TAKE 1 TABLET BY MOUTH EVERYDAY AT BEDTIME   valACYclovir (VALTREX) 1000 MG tablet TAKE 1 TABLET BY MOUTH TWICE A DAY AS NEEDED   No facility-administered encounter medications on file as of 01/26/2021.    Patient Active Problem List   Diagnosis Date Noted   Spinal stenosis of lumbar region 01/03/2021   Somatic dysfunction of spine, lumbar 08/10/2020   Chronic left-sided low back pain with left-sided sciatica 12/01/2019   Genital herpes simplex 09/24/2018   Overweight (BMI 25.0-29.9) 09/24/2018   Bipolar affective disorder, current episode depressed (HCC) 09/24/2018   Generalized anxiety disorder 09/24/2018   Social anxiety disorder 09/24/2018   Insomnia 09/24/2018   Recurrent major depression resistant to treatment (HCC) 09/24/2018   ADD (attention deficit disorder) without hyperactivity 09/24/2018   Elevated liver enzymes 09/24/2018   Drug-induced erectile dysfunction 09/24/2018   ATTENTION DEFICIT HYPERACTIVITY DISORDER 03/04/2007   Seasonal and perennial allergic rhinitis 03/04/2007    Past Medical History:  Diagnosis Date   Acute appendicitis 02/08/2013   ADHD (attention deficit hyperactivity disorder)    Allergic rhinitis    Appendicitis, acute 02/08/2013   Depression    since 8th grade, hospitalized 2015 ([place])   HSV infection     Relevant past medical, surgical, family and social history reviewed and updated as indicated. Interim medical history since our last visit reviewed.  Review of Systems Per HPI unless specifically indicated above  Objective:    BP 134/70   Pulse 70   Ht 5\' 11"  (1.803 m)   Wt 193 lb 6.4 oz (87.7 kg)   SpO2 97%   BMI 26.97 kg/m   Wt Readings from Last 3 Encounters:  01/26/21 193 lb 6.4 oz (87.7 kg)  01/03/21 193 lb (87.5 kg)  12/07/20 193 lb (87.5 kg)    Physical Exam Vitals and nursing note reviewed.   Constitutional:      General: He is not in acute distress.    Appearance: Normal appearance. He is not toxic-appearing.  Neck:     Vascular: No carotid bruit.  Cardiovascular:     Rate and Rhythm: Normal rate and regular rhythm.     Heart sounds: Normal heart sounds. No murmur heard. Pulmonary:     Effort: Pulmonary effort is normal. No respiratory distress.     Breath sounds: Normal breath sounds. No wheezing, rhonchi or rales.  Chest:     Chest wall: No mass, tenderness or edema.  Musculoskeletal:     Cervical back: Normal range of motion.  Skin:    General: Skin is warm and dry.     Coloration: Skin is not jaundiced or pale.     Findings: No erythema.  Neurological:     Mental Status: He is alert and oriented to person, place, and time.     Motor: No weakness.     Gait: Gait normal.  Psychiatric:        Mood and Affect: Mood normal.        Behavior: Behavior normal.        Thought Content: Thought content normal.        Judgment: Judgment normal.      Assessment & Plan:  1. Other chest pain Acute.  Suspect chest pain from deconditioning and elevated heart rate with activity and possibly combination of stimulant medication.  No chest pain currently and no tenderness to palpation of chest wall.  No abnormal heart sounds, carotid bruit, or significant family history of coronary artery disease - low risk for cardiovascular event.  Educated on slowly increasing physical activity from sedentary to physically active.  Also discussed stretching, plenty of water intake.  If symptoms do not improve with this, may be beneficial to determine if Vyvanse may be contributing.      Follow up plan: No follow-ups on file.

## 2021-01-30 ENCOUNTER — Other Ambulatory Visit: Payer: Self-pay | Admitting: Sports Medicine

## 2021-01-30 NOTE — Progress Notes (Signed)
Terry [name] [name] [address] [place] [address] Phone: [phone]   Assessment and Plan:     1. Chronic left-sided low back pain with left-sided sciatica 2. Somatic dysfunction of lumbar region 3. Somatic dysfunction of thoracic region 4. Somatic dysfunction of pelvic region -Chronic with exacerbation, subsequent visit - Moderate to significant improvement in day-to-day pain with home exercises, 3-week course of meloxicam - Discontinue daily use of meloxicam.  May start meloxicam 15 mg as needed for pain flares - Continue HEP for low back and piriformis syndrome.  Patient is getting significant relief with HEP, so will not prescribe PT at this time - Patient has received significant some relief with OMT in the past.  Elects for repeat OMT today.  Tolerated well per note below. - Decision today to treat with OMT was based on Physical Exam   After verbal consent patient was treated with HVLA (high velocity low amplitude), ME (muscle energy), FPR (flex positional release), ST (soft tissue), PC/PD (Pelvic Compression/ Pelvic Decompression) techniques in   thoracic, lumbar, and pelvic areas. Patient tolerated the procedure well with improvement in symptoms.  Patient educated on potential side effects of soreness and recommended to rest, hydrate, and use Tylenol as needed for pain control.   Pertinent previous records reviewed include none   Follow Up: As needed if no improvement or worsening of symptoms   Subjective:   I, Terry [name], am serving as a Neurosurgeon for [employer]  Chief Complaint: back and neck pain   HPI:   Pt is a 35 y/o male c/o back and neck pain. Pt's prior imaging revealed moderate spinal stenosis at L4-5. Pt was last seen by Dr. Katrinka Blazing on 11/22/20 for OMT. Today, pt reports most of his pain is in his low back w/ intermittent "catching." Pt is also interested in discussing treatment options like dry needling and/or  injections. Pt locates pain to L-side of his low back. Numbness/tingling present over L anterior-lateral aspect of his thigh.    Dx imaging: L-spine MRI             02/06/18 L-spine XR    01/31/2021 Patient states that neck and back have been pretty good haven't been to bad, occasionally getting the catching but not like he was before   Relevant Historical Information: None pertinent  Additional pertinent review of systems negative.  Current Outpatient Medications  Medication Sig Dispense Refill   buPROPion (WELLBUTRIN XL) 300 MG 24 hr tablet Take 300 mg by mouth daily.     clonazePAM (KLONOPIN) 0.5 MG tablet Take 1 tablet (0.5 mg total) by mouth 2 (two) times daily as needed for anxiety. 30 tablet 1   levothyroxine (SYNTHROID) 50 MCG tablet Take 1 tablet (50 mcg total) by mouth daily. 90 tablet 1   lisdexamfetamine (VYVANSE) 50 MG capsule Take 1 capsule (50 mg total) by mouth daily. 30 capsule 0   methocarbamol (ROBAXIN) 500 MG tablet Take 1 tablet (500 mg total) by mouth every 6 (six) hours as needed for muscle spasms. 120 tablet 6   OLANZapine (ZYPREXA) 5 MG tablet TAKE 1 TABLET BY MOUTH EVERYDAY AT BEDTIME 90 tablet 1   sildenafil (VIAGRA) 100 MG tablet Take 0.5-1 tablets (50-100 mg total) by mouth daily as needed for erectile dysfunction. 5 tablet 11   traZODone (DESYREL) 100 MG tablet TAKE 1 TABLET BY MOUTH EVERYDAY AT BEDTIME 90 tablet 0   valACYclovir (VALTREX) 1000 MG tablet TAKE 1 TABLET  BY MOUTH TWICE A DAY AS NEEDED 30 tablet 0   meloxicam (MOBIC) 15 MG tablet Take 1 tablet (15 mg total) by mouth daily. 30 tablet 0   No current facility-administered medications for this visit.      Objective:     Vitals:   01/31/21 1309  BP: 120/74  Pulse: 73  SpO2: 97%  Weight: 193 lb (87.5 kg)  Height: 5\' 11"  (1.803 m)      Body mass index is 26.92 kg/m.    Physical Exam:     General: Well-appearing, cooperative, sitting comfortably in no acute distress.   OMT Physical  Exam:  ASIS Compression Test: Positive left   Thoracic: nTTP paraspinal, T8 RRSR Lumbar: nTTP paraspinal, L1-3 RLSR Pelvis: Left posterior innominate  Negative piriformis test bilaterally  Electronically signed by:  D.[name] 1:27 PM 01/31/21

## 2021-01-31 ENCOUNTER — Other Ambulatory Visit: Payer: Self-pay

## 2021-01-31 ENCOUNTER — Ambulatory Visit (INDEPENDENT_AMBULATORY_CARE_PROVIDER_SITE_OTHER): Payer: BC Managed Care – PPO | Admitting: Sports Medicine

## 2021-01-31 ENCOUNTER — Encounter: Payer: Self-pay | Admitting: Family Medicine

## 2021-01-31 VITALS — BP 120/74 | HR 73 | Ht 71.0 in | Wt 193.0 lb

## 2021-01-31 DIAGNOSIS — M5442 Lumbago with sciatica, left side: Secondary | ICD-10-CM

## 2021-01-31 DIAGNOSIS — M9905 Segmental and somatic dysfunction of pelvic region: Secondary | ICD-10-CM | POA: Diagnosis not present

## 2021-01-31 DIAGNOSIS — G8929 Other chronic pain: Secondary | ICD-10-CM

## 2021-01-31 DIAGNOSIS — M9902 Segmental and somatic dysfunction of thoracic region: Secondary | ICD-10-CM | POA: Diagnosis not present

## 2021-01-31 DIAGNOSIS — F332 Major depressive disorder, recurrent severe without psychotic features: Secondary | ICD-10-CM | POA: Diagnosis not present

## 2021-01-31 DIAGNOSIS — M9903 Segmental and somatic dysfunction of lumbar region: Secondary | ICD-10-CM | POA: Diagnosis not present

## 2021-01-31 IMAGING — CR RIGHT FOOT COMPLETE - 3+ VIEW
3 series · 3 of 3 positions shown · non-contrast
Comparison: 06/12/2018

CLINICAL DATA: Foot contusion pain over the distal first MTP joint

EXAM:
RIGHT FOOT COMPLETE - 3+ VIEW

[x foot ap right]
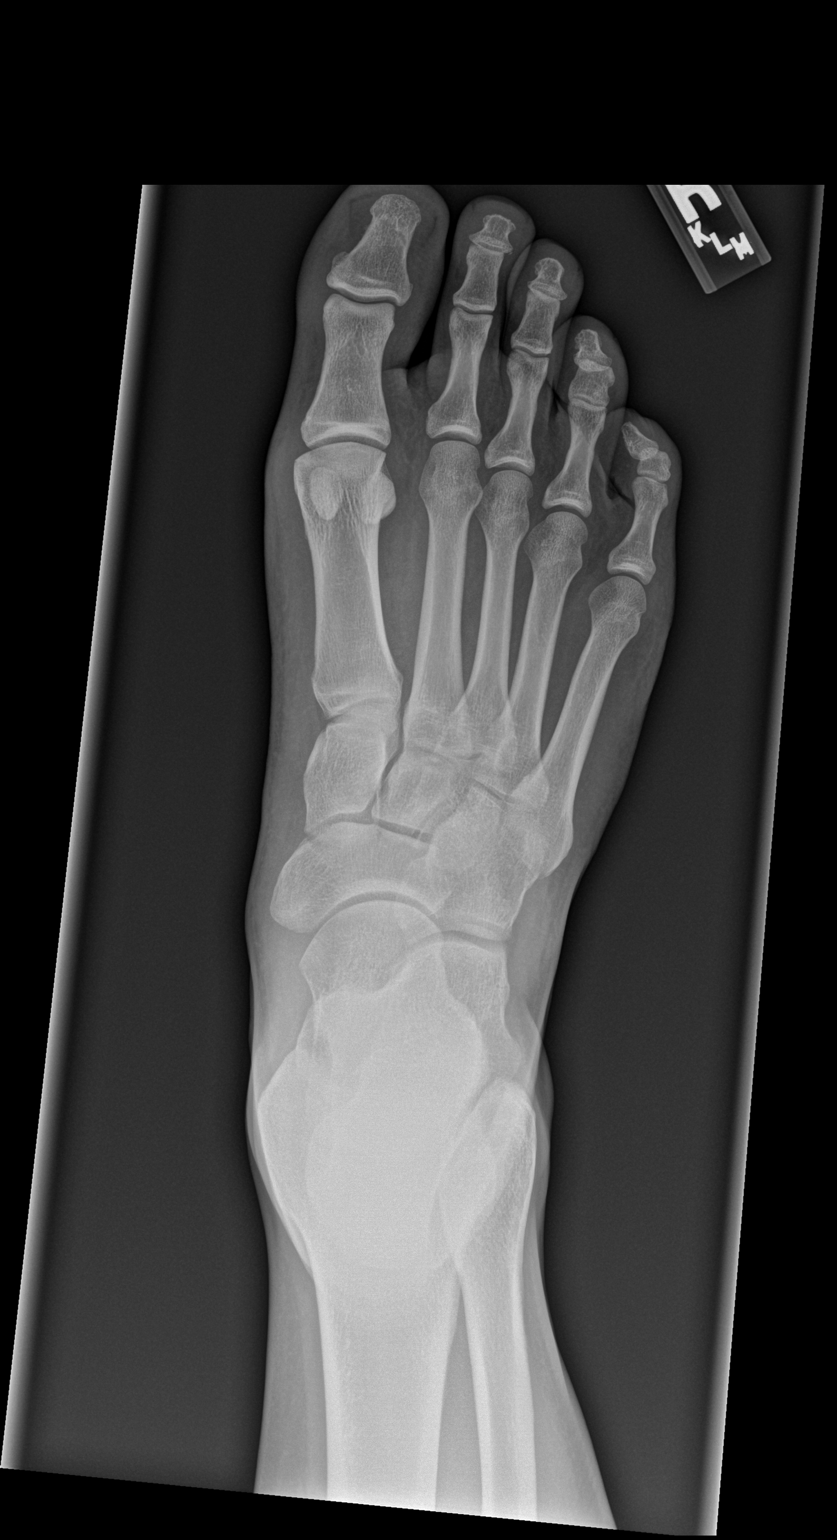

[x foot obl right]
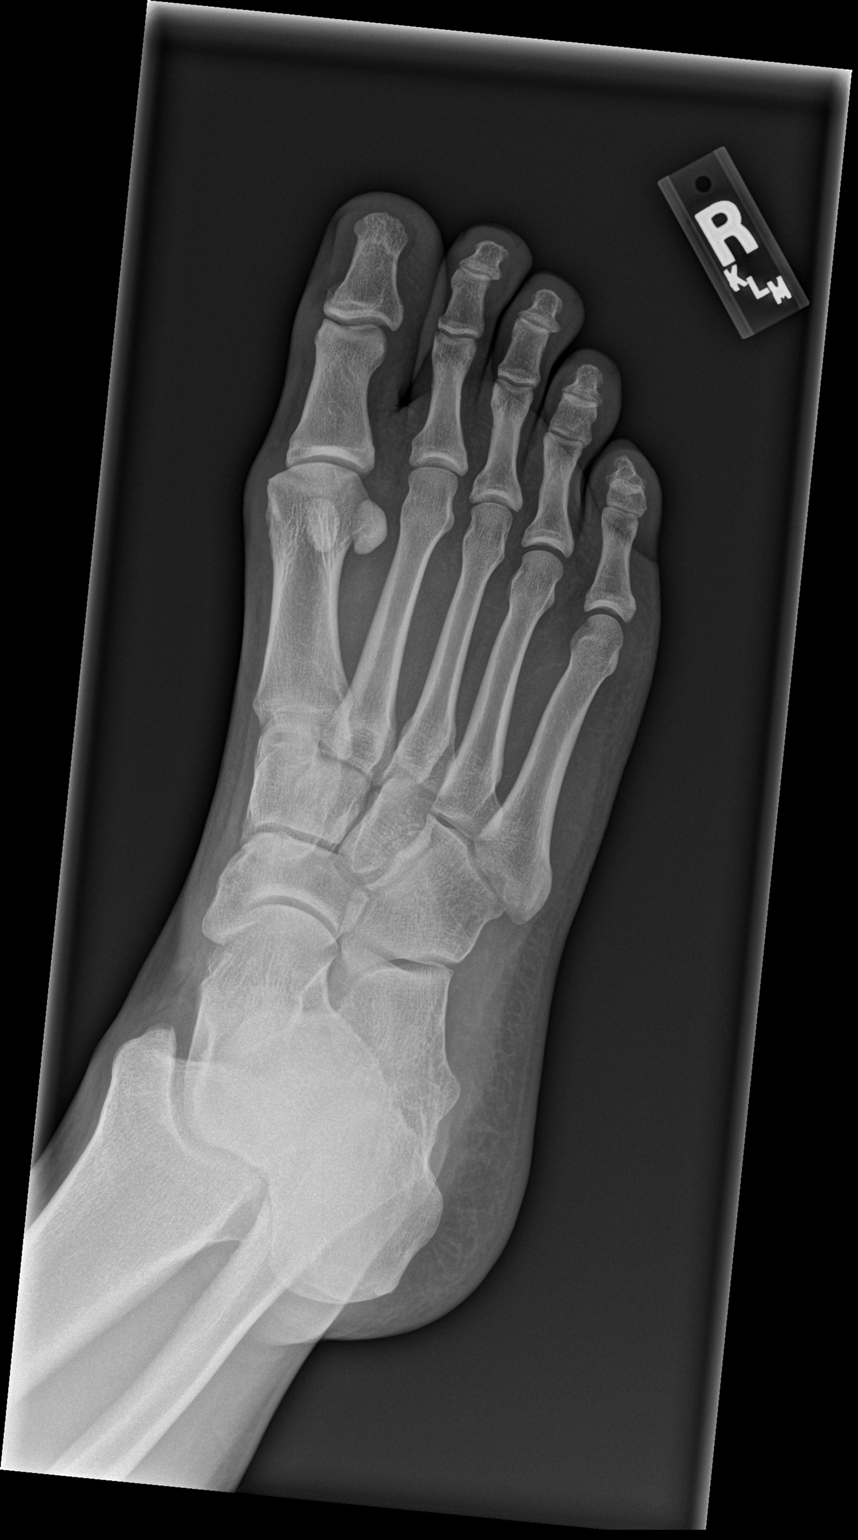

[x foot lat right]
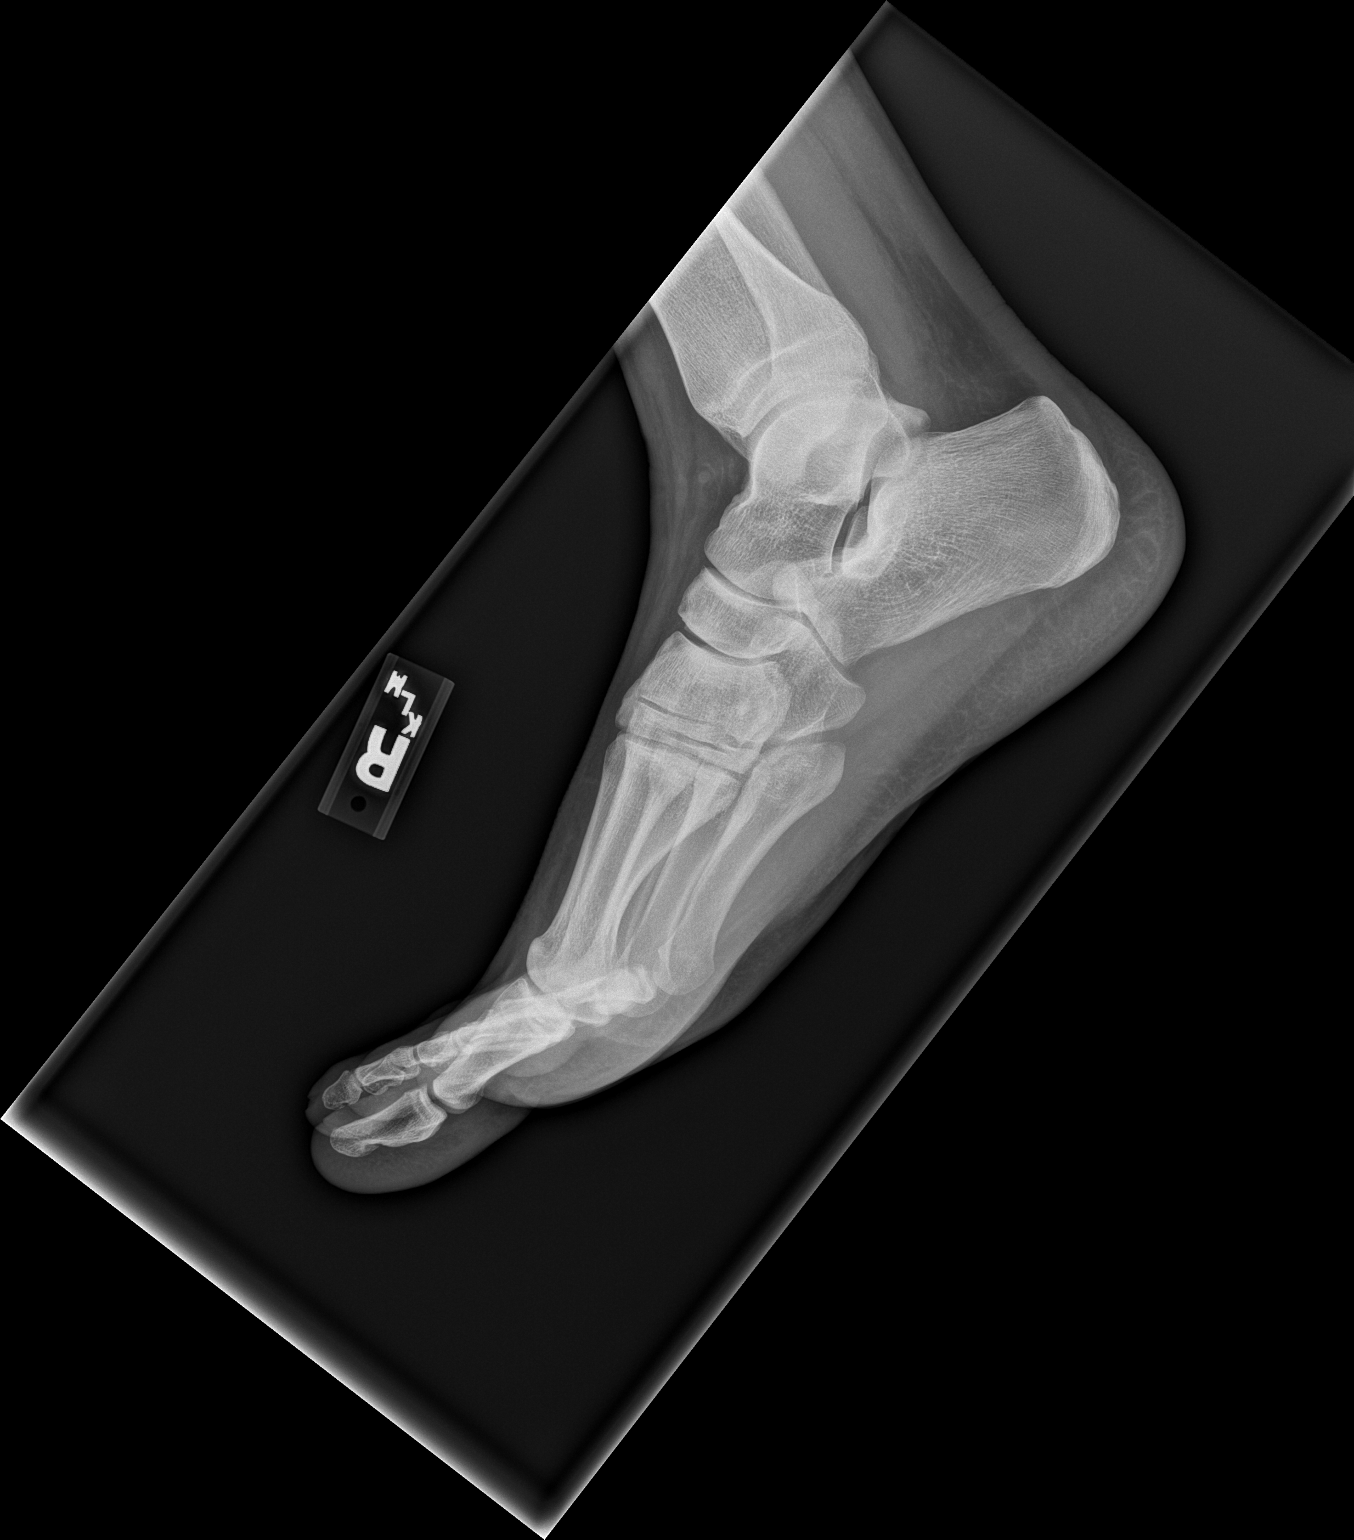

[3 of 3 positions shown; findings below may reference images not displayed]

FINDINGS: There is no evidence of fracture or dislocation. There is no
evidence of arthropathy or other focal bone abnormality. Soft
tissues are unremarkable.
IMPRESSION: Negative.

## 2021-01-31 MED ORDER — MELOXICAM 15 MG PO TABS: 15.0000 mg | ORAL_TABLET | Freq: Every day | ORAL | 0 refills | Status: DC

## 2021-01-31 NOTE — Patient Instructions (Addendum)
Good to see you  Refill of meloxicam sent to take as needed daily As needed follow up

## 2021-01-31 NOTE — Telephone Encounter (Signed)
LOV 01/26/21 with JM for chest pain Last refill 10/27/20, #30, 0 refills  Please review, thanks!

## 2021-02-01 ENCOUNTER — Other Ambulatory Visit: Payer: Self-pay | Admitting: Family Medicine

## 2021-02-01 MED ORDER — LISDEXAMFETAMINE DIMESYLATE 50 MG PO CAPS: 50.0000 mg | ORAL_CAPSULE | Freq: Every day | ORAL | 0 refills | Status: DC

## 2021-02-07 DIAGNOSIS — F332 Major depressive disorder, recurrent severe without psychotic features: Secondary | ICD-10-CM | POA: Diagnosis not present

## 2021-02-12 DIAGNOSIS — F332 Major depressive disorder, recurrent severe without psychotic features: Secondary | ICD-10-CM | POA: Diagnosis not present

## 2021-02-21 DIAGNOSIS — F332 Major depressive disorder, recurrent severe without psychotic features: Secondary | ICD-10-CM | POA: Diagnosis not present

## 2021-02-22 ENCOUNTER — Other Ambulatory Visit: Payer: Self-pay | Admitting: Family Medicine

## 2021-02-28 DIAGNOSIS — F411 Generalized anxiety disorder: Secondary | ICD-10-CM | POA: Diagnosis not present

## 2021-02-28 DIAGNOSIS — F3341 Major depressive disorder, recurrent, in partial remission: Secondary | ICD-10-CM | POA: Diagnosis not present

## 2021-03-04 ENCOUNTER — Encounter: Payer: Self-pay | Admitting: Family Medicine

## 2021-03-05 MED ORDER — LISDEXAMFETAMINE DIMESYLATE 50 MG PO CAPS: 50.0000 mg | ORAL_CAPSULE | Freq: Every day | ORAL | 0 refills | Status: DC

## 2021-03-05 NOTE — Telephone Encounter (Signed)
LOV 01/26/21 Last refill 02/01/21, #30, 0 refills  Please review, thanks!

## 2021-03-07 DIAGNOSIS — K1379 Other lesions of oral mucosa: Secondary | ICD-10-CM | POA: Diagnosis not present

## 2021-03-07 DIAGNOSIS — F332 Major depressive disorder, recurrent severe without psychotic features: Secondary | ICD-10-CM | POA: Diagnosis not present

## 2021-03-21 DIAGNOSIS — F332 Major depressive disorder, recurrent severe without psychotic features: Secondary | ICD-10-CM | POA: Diagnosis not present

## 2021-03-24 ENCOUNTER — Encounter: Payer: Self-pay | Admitting: Family Medicine

## 2021-03-27 NOTE — Progress Notes (Signed)
[name] [name] [address] [place] [address] Phone: [phone]   Assessment and Plan:     1. Chronic right shoulder pain -Chronic with exacerbation, initial sports medicine visit -Likely rotator cuff tendinopathy, specifically teres minor and infraspinatus based on HPI, physical exam - Start meloxicam 15 mg daily x2 weeks.  If still having pain after 2 weeks, complete 3rd-week of meloxicam. May use remaining meloxicam as needed once daily for pain control.  Do not to use additional NSAIDs while taking meloxicam.  May use Tylenol [phone] mg to 3 times a day for breakthrough pain. - Start HEP - X-ray obtained in clinic.  My interpretation: No acute fracture or dislocation. DG Shoulder Right; Future    2. Left wrist pain -Acute, uncomplicated, initial sports medicine visit - Concern for chronic appearing superior pole fracture of scaphoid - X-ray obtained in clinic.  My interpretation: Chronic appearing fracture of superior pole of scaphoid best seen on AP and oblique views.  No acute fracture or dislocation. - Patient had a fall 1 year ago that may have been the original trauma and because of fracture, however wrist pain is only been present for the past 2 to 3 weeks without mechanism of action - Patient does not have weakness in the hand, decreased grip strength, muscle atrophy on physical exam, so we will start with conservative therapy including relative rest and meloxicam DG Wrist Complete Left; Future     Pertinent previous records reviewed include none   Follow Up: 2 weeks for reevaluation.  If no improvement in wrist pain, would consider wrist bracing and wrist MRI to further evaluate potential scaphoid fracture.  If no improvement in shoulder pain, would consider subacromial CSI   Subjective:   I, [name], am serving as a Neurosurgeon for Doctor [employer]  Chief Complaint: right shoulder and left wrist pain    HPI:  Pt is a 36 y/o male c/o back and neck pain. Pt's prior imaging revealed moderate spinal stenosis at L4-5. Pt was last seen by Dr. Katrinka Blazing on 11/22/20 for OMT. Today, pt reports most of his pain is in his low back w/ intermittent "catching." Pt is also interested in discussing treatment options like dry needling and/or injections. Pt locates pain to L-side of his low back. Numbness/tingling present over L anterior-lateral aspect of his thigh.    Dx imaging: L-spine MRI             02/06/18 L-spine XR     01/31/2021 Patient states that neck and back have been pretty good haven't been to bad, occasionally getting the catching but not like he was before   03/28/2021 Patient states that he's mostly okay shoulder top of shoulder down into the arm  is bothering him no numbness throbbing pain constantly gets worse after he gets moving and starts moving it, no MOI , been going on for a couple of months, has tried heat and that seems to help for the moment but once the heat is off the pain is back    wanted to get his L wrist looked at , some pain on top of hand into the wrist been hurting for 2-3 weeks gets some sharp pain and tingling/ burning no numbness no radiating pain   Additional pertinent review of systems negative.   Current Outpatient Medications:    buPROPion (WELLBUTRIN XL) 150 MG 24 hr tablet, Take 150 mg by mouth every morning., Disp: , Rfl:  clonazePAM (KLONOPIN) 0.5 MG tablet, Take 1 tablet (0.5 mg total) by mouth 2 (two) times daily as needed for anxiety., Disp: 30 tablet, Rfl: 1   levothyroxine (SYNTHROID) 50 MCG tablet, TAKE 1 TABLET BY MOUTH EVERY DAY, Disp: 90 tablet, Rfl: 1   lisdexamfetamine (VYVANSE) 50 MG capsule, Take 1 capsule (50 mg total) by mouth daily., Disp: 30 capsule, Rfl: 0   meloxicam (MOBIC) 15 MG tablet, Take 1 tablet (15 mg total) by mouth daily., Disp: 30 tablet, Rfl: 0   OLANZapine (ZYPREXA) 5 MG tablet, TAKE 1 TABLET BY MOUTH EVERYDAY AT BEDTIME, Disp:  90 tablet, Rfl: 1   sildenafil (VIAGRA) 100 MG tablet, Take 0.5-1 tablets (50-100 mg total) by mouth daily as needed for erectile dysfunction., Disp: 5 tablet, Rfl: 11   SPRAVATO, 84 MG DOSE, 28 MG/DEVICE SOPK, Place into both nostrils., Disp: , Rfl:    traZODone (DESYREL) 100 MG tablet, TAKE 1 TABLET BY MOUTH EVERYDAY AT BEDTIME, Disp: 90 tablet, Rfl: 0   valACYclovir (VALTREX) 1000 MG tablet, TAKE 1 TABLET BY MOUTH TWICE A DAY AS NEEDED, Disp: 30 tablet, Rfl: 0   buPROPion (WELLBUTRIN XL) 300 MG 24 hr tablet, Take 300 mg by mouth daily., Disp: , Rfl:    methocarbamol (ROBAXIN) 500 MG tablet, Take 1 tablet (500 mg total) by mouth every 6 (six) hours as needed for muscle spasms., Disp: 120 tablet, Rfl: 6   Objective:     Vitals:   03/28/21 0918  BP: 120/80  Pulse: 94  SpO2: 97%  Weight: 189 lb (85.7 kg)  Height: 5\' 11"  (1.803 m)      Body mass index is 26.36 kg/m.    Physical Exam:    Gen: Appears well, nad, nontoxic and pleasant Neuro:sensation intact, strength is 5/5 with df/pf/inv/ev, muscle tone wnl Skin: no suspicious lesion or defmority Psych: A&O, appropriate mood and affect  Right shoulder: no deformity, swelling or muscle wasting No scapular winging FF 180, abd 180, int 15, ext 90 NTTP over the [place], clavicle, ac, coracoid, biceps groove, humerus, deltoid, trapezius, cervical spine  Positive empty can, Hawkins, subscap lift off Neg neer,  speeds, obriens, crossarm Neg ant drawer, sulcus sign, apprehension Negative Spurling's test bilat FROM of neck  Left Wrist:  No deformity or swelling appreciated. ROM  Ext 90, flexion70, radial/ulnar deviation 30 TTP dorsal and volar carpals nttp over the snauff box, radial styloid, ulnar styloid, 1st mcp, tfcc Negative Tinel's Negative finklestein Neg tfcc bounce test No pain with resisted deviation Mild pain with resisted flexion and extension   Electronically signed by:  D[name] 9:34 AM 03/28/21

## 2021-03-28 ENCOUNTER — Other Ambulatory Visit: Payer: Self-pay

## 2021-03-28 ENCOUNTER — Ambulatory Visit (INDEPENDENT_AMBULATORY_CARE_PROVIDER_SITE_OTHER): Payer: BC Managed Care – PPO

## 2021-03-28 ENCOUNTER — Ambulatory Visit (INDEPENDENT_AMBULATORY_CARE_PROVIDER_SITE_OTHER): Payer: BC Managed Care – PPO | Admitting: Sports Medicine

## 2021-03-28 VITALS — BP 120/80 | HR 94 | Ht 71.0 in | Wt 189.0 lb

## 2021-03-28 DIAGNOSIS — G8929 Other chronic pain: Secondary | ICD-10-CM

## 2021-03-28 DIAGNOSIS — M25511 Pain in right shoulder: Secondary | ICD-10-CM | POA: Diagnosis not present

## 2021-03-28 DIAGNOSIS — M25532 Pain in left wrist: Secondary | ICD-10-CM | POA: Diagnosis not present

## 2021-03-28 MED ORDER — MELOXICAM 15 MG PO TABS: 15.0000 mg | ORAL_TABLET | Freq: Every day | ORAL | 0 refills | Status: DC

## 2021-03-28 NOTE — Patient Instructions (Addendum)
Good to see you  Start meloxicam 15 mg daily x2 weeks.  If still having pain after 2 weeks, complete 3rd-week of meloxicam.  May use remaining meloxicam as needed once daily for pain control.  Do not to use additional NSAIDs while taking meloxicam.   May use Tylenol [phone] mg to 3 times a day for breakthrough pain. Rotator cuff HEP Two week follow up

## 2021-04-03 ENCOUNTER — Other Ambulatory Visit: Payer: Self-pay | Admitting: Family Medicine

## 2021-04-03 ENCOUNTER — Encounter: Payer: Self-pay | Admitting: Family Medicine

## 2021-04-03 MED ORDER — LISDEXAMFETAMINE DIMESYLATE 50 MG PO CAPS: 50.0000 mg | ORAL_CAPSULE | Freq: Every day | ORAL | 0 refills | Status: DC

## 2021-04-03 MED ORDER — TRAZODONE HCL 100 MG PO TABS: 100.0000 mg | ORAL_TABLET | Freq: Every day | ORAL | 0 refills | Status: DC

## 2021-04-03 NOTE — Telephone Encounter (Signed)
LOV w/PCP 12/07/20 Last refill 03/05/21, #30, 0 refills  Please review, thanks!

## 2021-04-04 DIAGNOSIS — F332 Major depressive disorder, recurrent severe without psychotic features: Secondary | ICD-10-CM | POA: Diagnosis not present

## 2021-04-04 DIAGNOSIS — F900 Attention-deficit hyperactivity disorder, predominantly inattentive type: Secondary | ICD-10-CM | POA: Diagnosis not present

## 2021-04-04 NOTE — Progress Notes (Signed)
Terry [name] [name] [address] [place] [address] Phone: [phone]   Assessment and Plan:     1. Chronic right shoulder pain -Chronic with exacerbation, subsequent visit - Generalized improvement in exacerbation of chronic pain with HEP and 2-week course of meloxicam most consistent with rotator cuff tendinopathy, specifically teres minor infraspinatus - Complete an additional 1 week of meloxicam 15 mg daily and then discontinue.  May use remainder of meloxicam as needed for pain control.  Use Tylenol for breakthrough pain - Continue HEP  2. Left wrist pain -Acute, improved - Reviewed x-ray and prior office visit concerning for chronic appearing superior pole fracture of scaphoid - As patient has no recent trauma, no hand weakness, resolution of wrist pain, no muscle wasting, we will not proceed with MRI at this time.  If any of these symptoms should present, would proceed with MRI at that time   Pertinent previous records reviewed include none   Follow Up: As needed in 2 to 3 weeks if no improvement or worsening of shoulder pain.  Would consider subacromial CSI at that time   Subjective:   I, Terry [name], am serving as a Neurosurgeon for Doctor [employer]  Chief Complaint: back and neck pain   HPI:    Terry [name] is a 36 y/o male c/o back and neck pain. Terry [name]'s prior imaging revealed moderate spinal stenosis at L4-5. Terry [name] was last seen by Dr. Katrinka Blazing on 11/22/20 for OMT. Today, Terry [name] reports most of his pain is in his low back w/ intermittent "catching." Terry [name] is also interested in discussing treatment options like dry needling and/or injections. Terry [name] locates pain to L-side of his low back. Numbness/tingling present over L anterior-lateral aspect of his thigh.    Dx imaging: L-spine MRI             02/06/18 L-spine XR     01/31/2021 Patient states that neck and back have been pretty good haven't been to bad, occasionally getting the catching but  not like he was before   04/11/2021 Patient states that he's doing better, still gets some catching but its not as bad    Relevant Historical Information: None pertinent  Additional pertinent review of systems negative.   Current Outpatient Medications:    buPROPion (WELLBUTRIN XL) 150 MG 24 hr tablet, Take 150 mg by mouth every morning., Disp: , Rfl:    clonazePAM (KLONOPIN) 0.5 MG tablet, Take 1 tablet (0.5 mg total) by mouth 2 (two) times daily as needed for anxiety., Disp: 30 tablet, Rfl: 1   Dextromethorphan-buPROPion ER (AUVELITY) 45-105 MG TBCR, Take by mouth., Disp: , Rfl:    levothyroxine (SYNTHROID) 50 MCG tablet, TAKE 1 TABLET BY MOUTH EVERY DAY, Disp: 90 tablet, Rfl: 1   lisdexamfetamine (VYVANSE) 50 MG capsule, Take 1 capsule (50 mg total) by mouth daily., Disp: 30 capsule, Rfl: 0   meloxicam (MOBIC) 15 MG tablet, Take 1 tablet (15 mg total) by mouth daily., Disp: 30 tablet, Rfl: 0   OLANZapine (ZYPREXA) 5 MG tablet, TAKE 1 TABLET BY MOUTH EVERYDAY AT BEDTIME, Disp: 90 tablet, Rfl: 1   sildenafil (VIAGRA) 100 MG tablet, Take 0.5-1 tablets (50-100 mg total) by mouth daily as needed for erectile dysfunction., Disp: 5 tablet, Rfl: 11   SPRAVATO, 84 MG DOSE, 28 MG/DEVICE SOPK, Place into both nostrils., Disp: , Rfl:    traZODone (DESYREL) 100 MG tablet, Take 1 tablet (100 mg total) by mouth at bedtime., Disp: 90  tablet, Rfl: 0   valACYclovir (VALTREX) 1000 MG tablet, TAKE 1 TABLET BY MOUTH TWICE A DAY AS NEEDED, Disp: 30 tablet, Rfl: 0   Objective:     Vitals:   04/11/21 0857  BP: 118/70  Pulse: 87  SpO2: 93%  Weight: 195 lb (88.5 kg)  Height: 5\' 11"  (1.803 m)      Body mass index is 27.2 kg/m.    Physical Exam:    Gen: Appears well, nad, nontoxic and pleasant Neuro:sensation intact, strength is 5/5 with df/pf/inv/ev, muscle tone wnl Skin: no suspicious lesion or defmority Psych: A&O, appropriate mood and affect  Right shoulder: no deformity, swelling or muscle  wasting No scapular winging FF 180, abd 180, int 0, ext 90 NTTP over the [place], clavicle, ac, coracoid, biceps groove, humerus, deltoid, trapezius, cervical spine Neg neer, hawkings, empty can, subscap liftoff, speeds, obriens, crossarm Neg ant drawer, sulcus sign, apprehension Negative Spurling's test bilat FROM of neck    Electronically signed by:  D.[name] 9:10 AM 04/11/21

## 2021-04-09 ENCOUNTER — Encounter: Payer: Self-pay | Admitting: Family Medicine

## 2021-04-10 ENCOUNTER — Encounter: Payer: Self-pay | Admitting: Family Medicine

## 2021-04-10 ENCOUNTER — Other Ambulatory Visit: Payer: Self-pay

## 2021-04-10 ENCOUNTER — Ambulatory Visit (INDEPENDENT_AMBULATORY_CARE_PROVIDER_SITE_OTHER): Payer: BC Managed Care – PPO | Admitting: Family Medicine

## 2021-04-10 VITALS — BP 122/78 | HR 93 | Temp 97.7°F | Resp 18 | Ht 71.0 in | Wt 189.0 lb

## 2021-04-10 DIAGNOSIS — R195 Other fecal abnormalities: Secondary | ICD-10-CM | POA: Diagnosis not present

## 2021-04-10 NOTE — Progress Notes (Signed)
Subjective:    Patient ID: ROI HJORT, male    DOB: November 17, 1985, 36 y.o.   MRN: [id number] Patient is a very pleasant 36 year old Caucasian gentleman here today reporting maroon-colored stool.  He denies any rectal pain.  He denies any itching or burning.  He denies any pain with defecation.  He states that he woke up and had a bowel movement and when he looked in the toilet, the stool was covered with dark red maroon-colored blood.  This was yesterday.  He has not had any further bleeding since yesterday.  He denies any abdominal pain or fever or chills or constipation or diarrhea.  There is no family history of colon cancer.  He denies any gastritis like symptoms.  He is on an NSAID occasionally but he denies any upset stomach.  I performed endoscopic evaluation today in the office.  The patient does have a skin tag at approximately 11:00 with the patient standing.  However this is an old skin tag and there is no active external bleeding.  On internal exam, I do not see any bleeding internal hemorrhoids. Past Medical History:  Diagnosis Date   Acute appendicitis 02/08/2013   ADHD (attention deficit hyperactivity disorder)    Allergic rhinitis    Appendicitis, acute 02/08/2013   Depression    since 8th grade, hospitalized 2015 ([place])   HSV infection    Past Surgical History:  Procedure Laterality Date   APPENDECTOMY     2013   LAPAROSCOPIC APPENDECTOMY N/A 02/08/2013   Procedure: APPENDECTOMY LAPAROSCOPIC;  Surgeon: Earnstine Regal, MD;  Location: [facility];  Service: General;  Laterality: N/A;   WISDOM TOOTH EXTRACTION     Current Outpatient Medications on File Prior to Visit  Medication Sig Dispense Refill   buPROPion (WELLBUTRIN XL) 150 MG 24 hr tablet Take 150 mg by mouth every morning.     clonazePAM (KLONOPIN) 0.5 MG tablet Take 1 tablet (0.5 mg total) by mouth 2 (two) times daily as needed for anxiety. 30 tablet 1   Dextromethorphan-buPROPion ER (AUVELITY) 45-105 MG  TBCR Take by mouth.     levothyroxine (SYNTHROID) 50 MCG tablet TAKE 1 TABLET BY MOUTH EVERY DAY 90 tablet 1   lisdexamfetamine (VYVANSE) 50 MG capsule Take 1 capsule (50 mg total) by mouth daily. 30 capsule 0   meloxicam (MOBIC) 15 MG tablet Take 1 tablet (15 mg total) by mouth daily. 30 tablet 0   OLANZapine (ZYPREXA) 5 MG tablet TAKE 1 TABLET BY MOUTH EVERYDAY AT BEDTIME 90 tablet 1   sildenafil (VIAGRA) 100 MG tablet Take 0.5-1 tablets (50-100 mg total) by mouth daily as needed for erectile dysfunction. 5 tablet 11   SPRAVATO, 84 MG DOSE, 28 MG/DEVICE SOPK Place into both nostrils.     traZODone (DESYREL) 100 MG tablet Take 1 tablet (100 mg total) by mouth at bedtime. 90 tablet 0   valACYclovir (VALTREX) 1000 MG tablet TAKE 1 TABLET BY MOUTH TWICE A DAY AS NEEDED 30 tablet 0   No current facility-administered medications on file prior to visit.   No Known Allergies Social History   Socioeconomic History   Marital status: Single    Spouse name: Not on file   Number of children: 0   Years of education: Not on file   Highest education level: Bachelor's degree (e.g., BA, AB, BS)  Occupational History   Occupation: works in garden department    Comment: [employer]   Tobacco Use   Smoking status: Former  Packs/day: 0.50    Years: 3.00    Pack years: 1.50    Types: Cigarettes    Quit date: 2009    Years since quitting: 14.1   Smokeless tobacco: Never  Vaping Use   Vaping Use: Never used  Substance and Sexual Activity   Alcohol use: Yes    Alcohol/week: 9.0 standard drinks    Types: 9 Cans of beer per week    Comment: 2-3   Drug use: Yes    Frequency: 7.0 times per week    Types: Marijuana   Sexual activity: Not on file  Other Topics Concern   Not on file  Social History Narrative   Lives at home alone   Right handed   Caffeine: 1 cup each morning    Social Determinants of Health   Financial Resource Strain: Not on file  Food Insecurity: Not on  file  Transportation Needs: Not on file  Physical Activity: Not on file  Stress: Not on file  Social Connections: Not on file  Intimate Partner Violence: Not on file   Family History  Problem Relation Age of Onset   Allergic rhinitis Mother    Cancer Maternal Grandfather        bone and prostate     Review of Systems  All other systems reviewed and are negative.     Objective:   Physical Exam Vitals reviewed.  Constitutional:      General: He is not in acute distress.    Appearance: He is well-developed. He is not diaphoretic.  Cardiovascular:     Rate and Rhythm: Normal rate and regular rhythm.     Heart sounds: Normal heart sounds. No murmur heard.   No friction rub. No gallop.  Pulmonary:     Effort: Pulmonary effort is normal. No respiratory distress.     Breath sounds: Normal breath sounds. No wheezing or rales.  Chest:     Chest wall: No tenderness.  Genitourinary:    Rectum: No mass, tenderness, anal fissure or external hemorrhoid. Normal anal tone.          Assessment & Plan:  Dark red stool - Plan: CBC with Differential/Platelet, COMPLETE METABOLIC PANEL WITH GFR, Fecal Globin By Immunochemistry, Ambulatory referral to Gastroenterology Rectal exam today in office and anoscopic evaluation does not show a source for the bleeding.  I suspect either an internal hemorrhoid or perhaps diverticulosis or an AVM.  I will check a CBC today to evaluate for any sign of significant blood loss.  I will also check his stool for blood.  Consult GI to discuss colonoscopy.

## 2021-04-11 ENCOUNTER — Ambulatory Visit (INDEPENDENT_AMBULATORY_CARE_PROVIDER_SITE_OTHER): Payer: BC Managed Care – PPO | Admitting: Sports Medicine

## 2021-04-11 VITALS — BP 118/70 | HR 87 | Ht 71.0 in | Wt 195.0 lb

## 2021-04-11 DIAGNOSIS — G8929 Other chronic pain: Secondary | ICD-10-CM | POA: Diagnosis not present

## 2021-04-11 DIAGNOSIS — M25511 Pain in right shoulder: Secondary | ICD-10-CM | POA: Diagnosis not present

## 2021-04-11 DIAGNOSIS — M25532 Pain in left wrist: Secondary | ICD-10-CM

## 2021-04-11 LAB — COMPLETE METABOLIC PANEL WITH GFR
AG Ratio: 2.1 (calc) (ref 1.0–2.5)
ALT: 36 U/L (ref 9–46)
AST: 21 U/L (ref 10–40)
Albumin: 4.9 g/dL (ref 3.6–5.1)
Alkaline phosphatase (APISO): 45 U/L (ref 36–130)
BUN/Creatinine Ratio: 23 (calc) — ABNORMAL HIGH (ref 6–22)
BUN: 27 mg/dL — ABNORMAL HIGH (ref 7–25)
CO2: 25 mmol/L (ref 20–32)
Calcium: 9.7 mg/dL (ref 8.6–10.3)
Chloride: 105 mmol/L (ref 98–110)
Creat: 1.18 mg/dL (ref 0.60–1.26)
Globulin: 2.3 g/dL (calc) (ref 1.9–3.7)
Glucose, Bld: 89 mg/dL (ref 65–99)
Potassium: 4.4 mmol/L (ref 3.5–5.3)
Sodium: 139 mmol/L (ref 135–146)
Total Bilirubin: 0.7 mg/dL (ref 0.2–1.2)
Total Protein: 7.2 g/dL (ref 6.1–8.1)
eGFR: 83 mL/min/{1.73_m2} (ref >=60)

## 2021-04-11 LAB — CBC WITH DIFFERENTIAL/PLATELET
Absolute Monocytes: 622 cells/uL (ref 200–950)
Basophils Absolute: 34 cells/uL (ref 0–200)
Basophils Relative: 0.4 %
Eosinophils Absolute: 59 cells/uL (ref 15–500)
Eosinophils Relative: 0.7 %
HCT: 46.5 % (ref 38.5–50.0)
Hemoglobin: 16 g/dL (ref 13.2–17.1)
Lymphs Abs: 4200 cells/uL — ABNORMAL HIGH (ref 850–3900)
MCH: 31.1 pg (ref 27.0–33.0)
MCHC: 34.4 g/dL (ref 32.0–36.0)
MCV: 90.3 fL (ref 80.0–100.0)
MPV: 9.5 fL (ref 7.5–12.5)
Monocytes Relative: 7.4 %
Neutro Abs: 3486 cells/uL (ref 1500–7800)
Neutrophils Relative %: 41.5 %
Platelets: 259 10*3/uL (ref 140–400)
RBC: 5.15 10*6/uL (ref 4.20–5.80)
RDW: 12.9 % (ref 11.0–15.0)
Total Lymphocyte: 50 %
WBC: 8.4 10*3/uL (ref 3.8–10.8)

## 2021-04-11 NOTE — Patient Instructions (Addendum)
Good to see you  Complete additional 1 week of meloxicam discontinue after that 1 week may use remainder as needed  Tylenol [phone] mg 1-2 tablets  2-3 times a day for breakthrough pain Continue HEP for shoulder  If no improvement or worsening of symptoms call and schedule and appointment

## 2021-04-17 ENCOUNTER — Other Ambulatory Visit: Payer: Self-pay | Admitting: Family Medicine

## 2021-04-17 DIAGNOSIS — R195 Other fecal abnormalities: Secondary | ICD-10-CM | POA: Diagnosis not present

## 2021-04-20 LAB — FECAL GLOBIN BY IMMUNOCHEMISTRY
FECAL GLOBIN RESULT:: NOT DETECTED
MICRO NUMBER:: 13048629
SPECIMEN QUALITY:: ADEQUATE

## 2021-04-20 LAB — HOUSE ACCOUNT TRACKING

## 2021-04-24 DIAGNOSIS — H02843 Edema of right eye, unspecified eyelid: Secondary | ICD-10-CM | POA: Diagnosis not present

## 2021-04-25 DIAGNOSIS — F3341 Major depressive disorder, recurrent, in partial remission: Secondary | ICD-10-CM | POA: Diagnosis not present

## 2021-04-25 DIAGNOSIS — F900 Attention-deficit hyperactivity disorder, predominantly inattentive type: Secondary | ICD-10-CM | POA: Diagnosis not present

## 2021-05-02 DIAGNOSIS — F332 Major depressive disorder, recurrent severe without psychotic features: Secondary | ICD-10-CM | POA: Diagnosis not present

## 2021-05-06 ENCOUNTER — Encounter: Payer: Self-pay | Admitting: Family Medicine

## 2021-05-07 MED ORDER — LISDEXAMFETAMINE DIMESYLATE 50 MG PO CAPS: 50.0000 mg | ORAL_CAPSULE | Freq: Every day | ORAL | 0 refills | Status: DC

## 2021-05-07 NOTE — Telephone Encounter (Signed)
LOV 04/10/21 ?Last refill 04/03/21, #30, 0 refills ? ?Please review, thanks! ?

## 2021-05-14 DIAGNOSIS — F332 Major depressive disorder, recurrent severe without psychotic features: Secondary | ICD-10-CM | POA: Diagnosis not present

## 2021-05-30 DIAGNOSIS — F332 Major depressive disorder, recurrent severe without psychotic features: Secondary | ICD-10-CM | POA: Diagnosis not present

## 2021-06-06 ENCOUNTER — Encounter: Payer: Self-pay | Admitting: Family Medicine

## 2021-06-07 MED ORDER — LISDEXAMFETAMINE DIMESYLATE 50 MG PO CAPS
50.0000 mg | ORAL_CAPSULE | Freq: Every day | ORAL | 0 refills | Status: DC
Start: 2021-06-07 — End: 2021-07-09

## 2021-06-07 NOTE — Telephone Encounter (Signed)
LOV 04/10/21 ?Last refill 05/07/21, #30, 0 refills ? ?Please review, thanks! ?

## 2021-06-13 ENCOUNTER — Ambulatory Visit: Payer: BC Managed Care – PPO | Admitting: Sports Medicine

## 2021-06-13 DIAGNOSIS — F332 Major depressive disorder, recurrent severe without psychotic features: Secondary | ICD-10-CM | POA: Diagnosis not present

## 2021-06-15 DIAGNOSIS — Z31440 Encounter of male for testing for genetic disease carrier status for procreative management: Secondary | ICD-10-CM | POA: Diagnosis not present

## 2021-06-19 NOTE — Progress Notes (Signed)
? ? Terry [name] ?[name] ?[place] ?Phone: [phone] ?  ?Assessment and Plan:   ?  ?1. Chronic right shoulder pain ?2. 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  Tolerated well per note below ?- Start HEP ?- May use Tylenol/NSAIDs as needed for pain control ? ?Procedure: Subacromial Injection ?Side: Right ? ?Risks explained and consent was given verbally. The site was cleaned with alcohol prep. A steroid injection was performed from posterior approach using 74mL of 1% lidocaine without epinephrine and 16mL of kenalog 40mg /ml. This was well tolerated and resulted in symptomatic relief.  Needle was removed, hemostasis achieved, and post injection instructions were explained.   Pt was advised to call or return to clinic if these symptoms worsen or fail to improve as anticipated.  ?  ?Pertinent previous records reviewed include none ?  ?Follow Up: 3 to 4 weeks for reevaluation.  Would consider formal PT versus ultrasound at that time based on improvement ?  ?Subjective:   ?, Terry [name], am serving as a Education administrator for Doctor [employer] ?  ?Chief Complaint: back and neck pain  ?  ?HPI:  ?  ?Pt is a 36 y/o male c/o back and neck pain. Pt's prior imaging revealed moderate spinal stenosis at L4-5. Pt was last seen by Dr. Tamala Julian on 11/22/20 for OMT. Today, pt reports most of his pain is in his low back w/ intermittent "catching." Pt is also interested in discussing treatment options like dry needling and/or injections. Pt locates pain to L-side of his low back. Numbness/tingling present over L anterior-lateral aspect of his thigh.  ?  ?Dx imaging: L-spine MRI ?            02/06/18 L-spine XR ?  ?  ?01/31/2021 ?Patient states that neck and back have been  pretty good haven't been to bad, occasionally getting the catching but not like he was before  ?  ?04/11/2021 ?Patient states that he's doing better, still gets some catching but its not as bad  ?  ?06/20/2021 ?Patient states that he still getting some catching and pain in one posterior muscle it feels like a knife or ice pick when he does certain movements or just a lot of shoulder movements  that radiates down to his arm and down his back , is thinking about dry needling wants to discuss  ? ?Relevant Historical Information: None pertinent ? ?Additional pertinent review of systems negative. ? ? ?Current Outpatient Medications:  ?  buPROPion (WELLBUTRIN XL) 300 MG 24 hr tablet, Take 300 mg by mouth daily., Disp: , Rfl:  ?  clonazePAM (KLONOPIN) 0.5 MG tablet, Take 1 tablet (0.5 mg total) by mouth 2 (two) times daily as needed for anxiety., Disp: 30 tablet, Rfl: 1 ?  Dextromethorphan-buPROPion ER (AUVELITY) 45-105 MG TBCR, Take by mouth., Disp: , Rfl:  ?  levothyroxine (SYNTHROID) 50 MCG tablet, TAKE 1 TABLET BY MOUTH EVERY DAY, Disp: 90 tablet, Rfl: 1 ?  lisdexamfetamine (VYVANSE) 50 MG capsule, Take 1 capsule (50 mg total) by mouth daily., Disp: 30 capsule, Rfl: 0 ?  meloxicam (MOBIC) 15 MG tablet, Take 1 tablet (15 mg total) by mouth daily., Disp: 30 tablet, Rfl: 0 ?  OLANZapine (ZYPREXA) 5 MG tablet, TAKE 1 TABLET BY MOUTH EVERYDAY AT BEDTIME, Disp: 90 tablet, Rfl:  1 ?  sildenafil (VIAGRA) 100 MG tablet, Take 0.5-1 tablets (50-100 mg total) by mouth daily as needed for erectile dysfunction., Disp: 5 tablet, Rfl: 11 ?  SPRAVATO, 84 MG DOSE, 28 MG/DEVICE SOPK, Place into both nostrils., Disp: , Rfl:  ?  traZODone (DESYREL) 100 MG tablet, Take 1 tablet (100 mg total) by mouth at bedtime., Disp: 90 tablet, Rfl: 0 ?  valACYclovir (VALTREX) 1000 MG tablet, TAKE 1 TABLET BY MOUTH TWICE A DAY AS NEEDED, Disp: 30 tablet, Rfl: 0  ? ?Objective:   ?  ?Vitals:  ? 06/20/21 0954  ?BP: 110/78  ?Pulse: 74  ?SpO2: 93%  ?Weight: 207  lb (93.9 kg)  ?Height: 5\' 11"  (1.803 m)  ?  ?  ?Body mass index is 28.87 kg/m?.  ?  ?Physical Exam:   ? ?Gen: Appears well, nad, nontoxic and pleasant ?Neuro:sensation intact, strength is 5/5 with df/pf/inv/ev, muscle tone wnl ?Skin: no suspicious lesion or defmority ?Psych: A&O, appropriate mood and affect ? ?Right shoulder: no deformity, swelling or muscle wasting ?No scapular winging ?FF 180, abd 180, int 10, ext 80 ?TTP posterior shoulder musculature ?NTTP over the [place], clavicle, ac, coracoid, biceps groove, humerus, deltoid, trapezius, cervical spine ?Positive Hawking's, empty can, O'Brien's, subscap liftoff ?Neg neer,  , speeds,   crossarm ?Neg ant drawer, sulcus sign, apprehension ?Negative Spurling's test bilat ?FROM of neck  ? ? ?Electronically signed by:  ?Terry [name] ?Terry [name] ?10:07 AM 06/20/21 ?

## 2021-06-20 ENCOUNTER — Ambulatory Visit (INDEPENDENT_AMBULATORY_CARE_PROVIDER_SITE_OTHER): Payer: BC Managed Care – PPO | Admitting: Sports Medicine

## 2021-06-20 VITALS — BP 110/78 | HR 74 | Ht 71.0 in | Wt 207.0 lb

## 2021-06-20 DIAGNOSIS — M7581 Other shoulder lesions, right shoulder: Secondary | ICD-10-CM | POA: Diagnosis not present

## 2021-06-20 DIAGNOSIS — G8929 Other chronic pain: Secondary | ICD-10-CM

## 2021-06-20 DIAGNOSIS — M25511 Pain in right shoulder: Secondary | ICD-10-CM

## 2021-06-20 NOTE — Patient Instructions (Addendum)
Good to see you Continue HEP  3-4 week follow up  

## 2021-07-09 ENCOUNTER — Other Ambulatory Visit: Payer: Self-pay | Admitting: Family Medicine

## 2021-07-09 MED ORDER — LISDEXAMFETAMINE DIMESYLATE 50 MG PO CAPS: 50.0000 mg | ORAL_CAPSULE | Freq: Every day | ORAL | 0 refills | Status: DC

## 2021-07-09 NOTE — Telephone Encounter (Signed)
LOV 04/10/21 ?Last refill 06/07/21, #30, 0 refills ? ?Please review, thanks! ? ?

## 2021-07-10 NOTE — Progress Notes (Signed)
? ? Terry [name] D.Terry [name] ?[facility] ?[place] ?Phone: [phone] ?  ?Assessment and Plan:   ?  ?1. Chronic right shoulder pain ?2. Right rotator cuff tendonitis ?-Chronic with exacerbation, subsequent visit ?- No improvement after subacromial CSI on 06/20/2021 ?- We will proceed with MRI right shoulder with intra-articular contrast to further evaluate for supraspinatus pathology versus labral pathology.  Patient has failed >6 weeks of conservative therapy which is included NSAID course, CSI, HEP and has consistent pain that will flare to >6/10 ?  ?Pertinent previous records reviewed include none ?  ?Follow Up: 3 days after MRI to review results and discuss treatment plan ?  ?Subjective:   ?I, Pincus Badder, am serving as a Education administrator for [employer] ?  ?Chief Complaint: back and neck pain  ?  ?HPI:  ?  ?Pt is a 36 y/o male c/o back and neck pain. Pt's prior imaging revealed moderate spinal stenosis at L4-5. Pt was last seen by Dr. Tamala Julian on 11/22/20 for OMT. Today, pt reports most of his pain is in his low back w/ intermittent "catching." Pt is also interested in discussing treatment options like dry needling and/or injections. Pt locates pain to L-side of his low back. Numbness/tingling present over L anterior-lateral aspect of his thigh.  ?  ?Dx imaging: L-spine MRI ?            02/06/18 L-spine XR ?  ?  ?01/31/2021 ?Patient states that neck and back have been pretty good haven't been to bad, occasionally getting the catching but not like he was before  ?  ?04/11/2021 ?Patient states that he's doing better, still gets some catching but its not as bad  ?  ?06/20/2021 ?Patient states that he still getting some catching and pain in one posterior muscle it feels like a knife or ice pick when he does certain movements or just a lot of shoulder movements  that radiates down to his arm and down his back , is thinking about dry needling wants to discuss   ? ?07/11/2021 ?Patient states that CSI didn't do much of anything for his pain, feels like he is getting worse, pain is now radiating up higher up the shoulder , notices when he picks things up in a wrong position it will send a lightening feeling through his arm , flares up more when he is using it more  ? ? ?Relevant Historical Information: None pertinent ? ?Additional pertinent review of systems negative. ? ? ?Current Outpatient Medications:  ?  clonazePAM (KLONOPIN) 0.5 MG tablet, Take 1 tablet (0.5 mg total) by mouth 2 (two) times daily as needed for anxiety., Disp: 30 tablet, Rfl: 1 ?  Dextromethorphan-buPROPion ER (AUVELITY) 45-105 MG TBCR, Take by mouth., Disp: , Rfl:  ?  levothyroxine (SYNTHROID) 50 MCG tablet, TAKE 1 TABLET BY MOUTH EVERY DAY, Disp: 90 tablet, Rfl: 1 ?  lisdexamfetamine (VYVANSE) 50 MG capsule, Take 1 capsule (50 mg total) by mouth daily., Disp: 30 capsule, Rfl: 0 ?  meloxicam (MOBIC) 15 MG tablet, Take 1 tablet (15 mg total) by mouth daily., Disp: 30 tablet, Rfl: 0 ?  OLANZapine (ZYPREXA) 5 MG tablet, TAKE 1 TABLET BY MOUTH EVERYDAY AT BEDTIME, Disp: 90 tablet, Rfl: 1 ?  sildenafil (VIAGRA) 100 MG tablet, Take 0.5-1 tablets (50-100 mg total) by mouth daily as needed for erectile dysfunction., Disp: 5 tablet, Rfl: 11 ?  SPRAVATO, 84 MG DOSE, 28 MG/DEVICE SOPK, Place into both nostrils.,  Disp: , Rfl:  ?  traZODone (DESYREL) 100 MG tablet, Take 1 tablet (100 mg total) by mouth at bedtime., Disp: 90 tablet, Rfl: 0 ?  valACYclovir (VALTREX) 1000 MG tablet, TAKE 1 TABLET BY MOUTH TWICE A DAY AS NEEDED, Disp: 30 tablet, Rfl: 0 ?  buPROPion (WELLBUTRIN XL) 300 MG 24 hr tablet, Take 300 mg by mouth daily., Disp: , Rfl:   ? ?Objective:   ?  ?Vitals:  ? 07/11/21 1124  ?BP: 122/72  ?Pulse: (!) 56  ?SpO2: 97%  ?Weight: 206 lb (93.4 kg)  ?Height: 5\' 11"  (1.803 m)  ?  ?  ?Body mass index is 28.73 kg/m?.  ?  ?Physical Exam:   ? ?Gen: Appears well, nad, nontoxic and pleasant ?Neuro:sensation intact,  strength is 5/5 with df/pf/inv/ev, muscle tone wnl ?Skin: no suspicious lesion or defmority ?Psych: A&O, appropriate mood and affect ? ?Right shoulder: no deformity, swelling or muscle wasting ?No scapular winging ?FF 180, abd 180 (pain at end of motion), int 0 (pain at end of motion), ext 90 ?NTTP over the [place], clavicle, ac, coracoid, biceps groove, humerus, deltoid, trapezius, cervical spine ?Positive Neer, Hawkins, empty can, O'Brien ?Neg  , subscap liftoff, speeds,  , crossarm ?Neg ant drawer, sulcus sign, apprehension ?Negative Spurling's test bilat ?FROM of neck  ? ? ?Electronically signed by:  ?Terry [name] D.Terry [name] ?[facility] ?11:38 AM 07/11/21 ?

## 2021-07-11 ENCOUNTER — Ambulatory Visit (INDEPENDENT_AMBULATORY_CARE_PROVIDER_SITE_OTHER): Payer: BC Managed Care – PPO | Admitting: Sports Medicine

## 2021-07-11 VITALS — BP 122/72 | HR 56 | Ht 71.0 in | Wt 206.0 lb

## 2021-07-11 DIAGNOSIS — M7581 Other shoulder lesions, right shoulder: Secondary | ICD-10-CM | POA: Diagnosis not present

## 2021-07-11 DIAGNOSIS — M25511 Pain in right shoulder: Secondary | ICD-10-CM | POA: Diagnosis not present

## 2021-07-11 DIAGNOSIS — G8929 Other chronic pain: Secondary | ICD-10-CM

## 2021-07-11 DIAGNOSIS — F332 Major depressive disorder, recurrent severe without psychotic features: Secondary | ICD-10-CM | POA: Diagnosis not present

## 2021-07-11 NOTE — Patient Instructions (Addendum)
Good to see you  ?MRI referral right shoulder ?Follow up 3 days after your MRI to discuss your results ?

## 2021-07-20 ENCOUNTER — Ambulatory Visit
Admission: RE | Admit: 2021-07-20 | Discharge: 2021-07-20 | Disposition: A | Discharge status: Home / Self Care | Payer: BC Managed Care – PPO | Source: Ambulatory Visit | Attending: Sports Medicine | Admitting: Sports Medicine

## 2021-07-20 DIAGNOSIS — M7581 Other shoulder lesions, right shoulder: Secondary | ICD-10-CM

## 2021-07-20 DIAGNOSIS — G8929 Other chronic pain: Secondary | ICD-10-CM

## 2021-07-20 DIAGNOSIS — M25511 Pain in right shoulder: Secondary | ICD-10-CM | POA: Diagnosis not present

## 2021-07-20 MED ORDER — IOPAMIDOL (ISOVUE-M 200) INJECTION 41%
16.0000 mL | Freq: Once | INTRAMUSCULAR | Status: AC
  Administered 2021-07-20: 16 mL via INTRA_ARTICULAR

## 2021-07-24 NOTE — Progress Notes (Signed)
Pt was called and left VM to follow up in clinic

## 2021-07-25 DIAGNOSIS — F332 Major depressive disorder, recurrent severe without psychotic features: Secondary | ICD-10-CM | POA: Diagnosis not present

## 2021-07-27 DIAGNOSIS — F4322 Adjustment disorder with anxiety: Secondary | ICD-10-CM | POA: Diagnosis not present

## 2021-07-27 DIAGNOSIS — F3341 Major depressive disorder, recurrent, in partial remission: Secondary | ICD-10-CM | POA: Diagnosis not present

## 2021-07-31 NOTE — Progress Notes (Unsigned)
[name] [address] [place] [address] Phone: [phone]   Assessment and Plan:    1. Chronic right shoulder pain 2. Right rotator cuff tendonitis -Chronic with exacerbation, subsequent visit - Reviewed MRI right shoulder with patient in office which was unremarkable - Patient has experienced minimal to no improvement with daily course of NSAIDs, subacromial CSI, intra-articular CSI with arthrogram, HEP - Still most consistent with rotator cuff strain external rotators - We will refer to physical therapy at this time to introduce exercises as well as other treatment modalities   Pertinent previous records reviewed include MRI right shoulder 07/20/2021   Follow Up: 4 weeks for review of symptoms to ensure that physical therapy is making progress   Subjective:   I, Jerene Canny, am serving as a Neurosurgeon for Doctor [employer]   Chief Complaint: back and neck pain    HPI:    Pt is a 36 y/o male c/o back and neck pain. Pt's prior imaging revealed moderate spinal stenosis at L4-5. Pt was last seen by Dr. Katrinka Blazing on 11/22/20 for OMT. Today, pt reports most of his pain is in his low back w/ intermittent "catching." Pt is also interested in discussing treatment options like dry needling and/or injections. Pt locates pain to L-side of his low back. Numbness/tingling present over L anterior-lateral aspect of his thigh.    Dx imaging: L-spine MRI             02/06/18 L-spine XR     01/31/2021 Patient states that neck and back have been pretty good haven't been to bad, occasionally getting the catching but not like he was before    04/11/2021 Patient states that he's doing better, still gets some catching but its not as bad    06/20/2021 Patient states that he still getting some catching and pain in one posterior muscle it feels like a knife or ice pick when he does certain movements or just a lot of shoulder movements  that  radiates down to his arm and down his back , is thinking about dry needling wants to discuss    07/11/2021 Patient states that CSI didn't do much of anything for his pain, feels like he is getting worse, pain is now radiating up higher up the shoulder , notices when he picks things up in a wrong position it will send a lightening feeling through his arm , flares up more when he is using it more   08/01/2021 Patient states that he is getting worst   Additional pertinent review of systems negative.   Current Outpatient Medications:    clonazePAM (KLONOPIN) 0.5 MG tablet, Take 1 tablet (0.5 mg total) by mouth 2 (two) times daily as needed for anxiety., Disp: 30 tablet, Rfl: 1   Dextromethorphan-buPROPion ER (AUVELITY) 45-105 MG TBCR, Take by mouth., Disp: , Rfl:    levothyroxine (SYNTHROID) 50 MCG tablet, TAKE 1 TABLET BY MOUTH EVERY DAY, Disp: 90 tablet, Rfl: 1   lisdexamfetamine (VYVANSE) 50 MG capsule, Take 1 capsule (50 mg total) by mouth daily., Disp: 30 capsule, Rfl: 0   meloxicam (MOBIC) 15 MG tablet, Take 1 tablet (15 mg total) by mouth daily., Disp: 30 tablet, Rfl: 0   OLANZapine (ZYPREXA) 5 MG tablet, TAKE 1 TABLET BY MOUTH EVERYDAY AT BEDTIME, Disp: 90 tablet, Rfl: 1   sildenafil (VIAGRA) 100 MG tablet, Take 0.5-1 tablets (50-100 mg total) by mouth daily as needed for erectile dysfunction., Disp:  5 tablet, Rfl: 11   SPRAVATO, 84 MG DOSE, 28 MG/DEVICE SOPK, Place into both nostrils., Disp: , Rfl:    traZODone (DESYREL) 100 MG tablet, Take 1 tablet (100 mg total) by mouth at bedtime., Disp: 90 tablet, Rfl: 0   valACYclovir (VALTREX) 1000 MG tablet, TAKE 1 TABLET BY MOUTH TWICE A DAY AS NEEDED, Disp: 30 tablet, Rfl: 0   buPROPion (WELLBUTRIN XL) 300 MG 24 hr tablet, Take 300 mg by mouth daily., Disp: , Rfl:    Objective:     Vitals:   08/01/21 1017  BP: 110/80  Pulse: 68  SpO2: 98%  Weight: 207 lb (93.9 kg)  Height: 5\' 11"  (1.803 m)      Body mass index is 28.87 kg/m.     Physical Exam:    Gen: Appears well, nad, nontoxic and pleasant Neuro:sensation intact, strength is 5/5 with df/pf/inv/ev, muscle tone wnl Skin: no suspicious lesion or defmority Psych: A&O, appropriate mood and affect   Right shoulder: no deformity, swelling or muscle wasting No scapular winging FF 180, abd 180 (pain at end of motion), int 0 (pain at end of motion), ext 90 NTTP over the [place], clavicle, ac, coracoid, biceps groove, humerus, deltoid, trapezius, cervical spine Positive Neer, Hawkins, empty can, O'Brien Neg  , subscap liftoff, speeds,  , crossarm Neg ant drawer, sulcus sign, apprehension Negative Spurling's test bilat FROM of neck      Electronically signed by:  [name] 10:29 AM 08/01/21

## 2021-08-01 ENCOUNTER — Ambulatory Visit (INDEPENDENT_AMBULATORY_CARE_PROVIDER_SITE_OTHER): Payer: BC Managed Care – PPO | Admitting: Sports Medicine

## 2021-08-01 VITALS — BP 110/80 | HR 68 | Ht 71.0 in | Wt 207.0 lb

## 2021-08-01 DIAGNOSIS — M25511 Pain in right shoulder: Secondary | ICD-10-CM

## 2021-08-01 DIAGNOSIS — G8929 Other chronic pain: Secondary | ICD-10-CM

## 2021-08-01 DIAGNOSIS — M7581 Other shoulder lesions, right shoulder: Secondary | ICD-10-CM | POA: Diagnosis not present

## 2021-08-01 NOTE — Patient Instructions (Addendum)
Good to see you  PT referral  4 week follow up   

## 2021-08-13 ENCOUNTER — Encounter: Payer: Self-pay | Admitting: Family Medicine

## 2021-08-16 ENCOUNTER — Encounter: Payer: Self-pay | Admitting: Family Medicine

## 2021-08-17 MED ORDER — TRAZODONE HCL 100 MG PO TABS: 100.0000 mg | ORAL_TABLET | Freq: Every day | ORAL | 0 refills | Status: DC

## 2021-08-20 ENCOUNTER — Other Ambulatory Visit: Payer: Self-pay | Admitting: Family Medicine

## 2021-08-22 ENCOUNTER — Ambulatory Visit: Payer: BC Managed Care – PPO | Attending: Sports Medicine | Admitting: Physical Therapy

## 2021-08-22 ENCOUNTER — Other Ambulatory Visit: Payer: Self-pay

## 2021-08-22 ENCOUNTER — Encounter: Payer: Self-pay | Admitting: Physical Therapy

## 2021-08-22 DIAGNOSIS — F332 Major depressive disorder, recurrent severe without psychotic features: Secondary | ICD-10-CM | POA: Diagnosis not present

## 2021-08-22 DIAGNOSIS — G8929 Other chronic pain: Secondary | ICD-10-CM | POA: Insufficient documentation

## 2021-08-22 DIAGNOSIS — M25511 Pain in right shoulder: Secondary | ICD-10-CM | POA: Diagnosis not present

## 2021-08-22 DIAGNOSIS — M6281 Muscle weakness (generalized): Secondary | ICD-10-CM | POA: Insufficient documentation

## 2021-08-22 DIAGNOSIS — M7581 Other shoulder lesions, right shoulder: Secondary | ICD-10-CM | POA: Diagnosis not present

## 2021-08-22 NOTE — Progress Notes (Deleted)
Terry [name] [name] [address] [place] [address] Phone: [phone]   Assessment and Plan:     There are no diagnoses linked to this encounter.  ***   Pertinent previous records reviewed include ***   Follow Up: ***     Subjective:   I, Terry [name], am serving as a Neurosurgeon for Doctor [employer]   Chief Complaint: back and neck pain    HPI:    Pt is a 37 y/o male c/o back and neck pain. Pt's prior imaging revealed moderate spinal stenosis at L4-5. Pt was last seen by Dr. Katrinka [name] on 11/22/20 for OMT. Today, pt reports most of his pain is in his low back w/ intermittent "catching." Pt is also interested in discussing treatment options like dry needling and/or injections. Pt locates pain to L-side of his low back. Numbness/tingling present over L anterior-lateral aspect of his thigh.    Dx imaging: L-spine MRI             02/06/18 L-spine XR     01/31/2021 Patient states that neck and back have been pretty good haven't been to bad, occasionally getting the catching but not like he was before    04/11/2021 Patient states that he's doing better, still gets some catching but its not as bad    06/20/2021 Patient states that he still getting some catching and pain in one posterior muscle it feels like a knife or ice pick when he does certain movements or just a lot of shoulder movements  that radiates down to his arm and down his back , is thinking about dry needling wants to discuss    07/11/2021 Patient states that CSI didn't do much of anything for his pain, feels like he is getting worse, pain is now radiating up higher up the shoulder , notices when he picks things up in a wrong position it will send a lightening feeling through his arm , flares up more when he is using it more    08/01/2021 Patient states that he is getting worst   08/29/2021 Patient states   Additional pertinent review of systems negative.   Current  Outpatient Medications:    buPROPion (WELLBUTRIN XL) 300 MG 24 hr tablet, Take 300 mg by mouth daily. (Patient not taking: Reported on 08/22/2021), Disp: , Rfl:    clonazePAM (KLONOPIN) 0.5 MG tablet, Take 1 tablet (0.5 mg total) by mouth 2 (two) times daily as needed for anxiety., Disp: 30 tablet, Rfl: 1   Dextromethorphan-buPROPion ER (AUVELITY) 45-105 MG TBCR, Take by mouth., Disp: , Rfl:    levothyroxine (SYNTHROID) 50 MCG tablet, TAKE 1 TABLET BY MOUTH EVERY DAY, Disp: 90 tablet, Rfl: 1   lisdexamfetamine (VYVANSE) 50 MG capsule, Take 1 capsule (50 mg total) by mouth daily., Disp: 30 capsule, Rfl: 0   meloxicam (MOBIC) 15 MG tablet, Take 1 tablet (15 mg total) by mouth daily. (Patient not taking: Reported on 08/22/2021), Disp: 30 tablet, Rfl: 0   OLANZapine (ZYPREXA) 5 MG tablet, TAKE 1 TABLET BY MOUTH EVERYDAY AT BEDTIME, Disp: 90 tablet, Rfl: 1   sildenafil (VIAGRA) 100 MG tablet, Take 0.5-1 tablets (50-100 mg total) by mouth daily as needed for erectile dysfunction. (Patient not taking: Reported on 08/22/2021), Disp: 5 tablet, Rfl: 11   SPRAVATO, 84 MG DOSE, 28 MG/DEVICE SOPK, Place into both nostrils., Disp: , Rfl:    traZODone (DESYREL) 100 MG tablet, Take 1 tablet (100 mg total) by  mouth at bedtime., Disp: 90 tablet, Rfl: 0   valACYclovir (VALTREX) 1000 MG tablet, TAKE 1 TABLET BY MOUTH TWICE A DAY AS NEEDED, Disp: 30 tablet, Rfl: 0   Objective:     There were no vitals filed for this visit.    There is no height or weight on file to calculate BMI.    Physical Exam:    ***   Electronically signed by:  Terry [name] 1:19 PM 08/22/21

## 2021-08-22 NOTE — Patient Instructions (Signed)
Access Code: [email or web address] URL: [email or web address] Date: 08/22/2021 Prepared by: Rosana Hoes  Exercises - Standing Upper Trapezius Mobilization with Small Ball  - 1-2 x daily - 10 reps - Sleeper Stretch  - 1-2 x daily - 3 reps - 20 seconds hold - Shoulder External Rotation and Scapular Retraction with Resistance  - 1-2 x daily - 2 sets - 10 reps

## 2021-08-22 NOTE — Therapy (Signed)
OUTPATIENT PHYSICAL THERAPY SHOULDER EVALUATION   Patient Name: Terry [name] MRN: [id number] DOB:21-Nov-1985, 36 y.o., male Today's Date: 08/22/2021   PT End of Session - 08/22/21 1216     Visit Number 1    Number of Visits 6    Date for PT Re-Evaluation 10/03/21    Authorization Type BCBS    PT Start Time 1130    PT Stop Time 1215    PT Time Calculation (min) 45 min    Activity Tolerance Patient tolerated treatment well    Behavior During Therapy [facility] for tasks assessed/performed             Past Medical History:  Diagnosis Date   Acute appendicitis 02/08/2013   ADHD (attention deficit hyperactivity disorder)    Allergic rhinitis    Appendicitis, acute 02/08/2013   Depression    since 8th grade, hospitalized 2015 ([place])   HSV infection    Past Surgical History:  Procedure Laterality Date   APPENDECTOMY     2013   LAPAROSCOPIC APPENDECTOMY N/A 02/08/2013   Procedure: APPENDECTOMY LAPAROSCOPIC;  Surgeon: Velora Heckler, MD;  Location: [facility];  Service: General;  Laterality: N/A;   WISDOM TOOTH EXTRACTION     Patient Active Problem List   Diagnosis Date Noted   Spinal stenosis of lumbar region 01/03/2021   Somatic dysfunction of spine, lumbar 08/10/2020   Chronic left-sided low back pain with left-sided sciatica 12/01/2019   Genital herpes simplex 09/24/2018   Overweight (BMI 25.0-29.9) 09/24/2018   Bipolar affective disorder, current episode depressed (HCC) 09/24/2018   Generalized anxiety disorder 09/24/2018   Social anxiety disorder 09/24/2018   Insomnia 09/24/2018   Recurrent major depression resistant to treatment (HCC) 09/24/2018   ADD (attention deficit disorder) without hyperactivity 09/24/2018   Elevated liver enzymes 09/24/2018   Drug-induced erectile dysfunction 09/24/2018   ATTENTION DEFICIT HYPERACTIVITY DISORDER 03/04/2007   Seasonal and perennial allergic rhinitis 03/04/2007    PCP: [name], MD  REFERRING PROVIDER: Richardean Sale, DO  REFERRING DIAG:  Chronic right shoulder pain Right rotator cuff tendonitis  THERAPY DIAG:  Chronic right shoulder pain  Muscle weakness (generalized)  Rationale for Evaluation and Treatment Rehabilitation  ONSET DATE: Ongoing 6 months   SUBJECTIVE:       SUBJECTIVE STATEMENT: Patient reports right shoulder pain and tightness, he feels like it is muscular tightness that are locked up. Pain is located to the posterior aspect of the shoulder and can shoot down the arm. Over using the arm can set it off, like picking up his girlfriend's kids or driving. His range of motion seems to be fine but will become painful is he uses it to much.  PERTINENT HISTORY: None  PAIN:  Are you having pain? Yes:  NPRS scale: 0/10 (avg 7-8/10 when pain occurs) Pain location: Right posterior shoulder Pain description: Intermittent, sharp, stabbing, electricity shooting down nerves Aggravating factors: Over using arm Relieving factors: Hold position of comfort until pain goes away  PRECAUTIONS: None  WEIGHT BEARING RESTRICTIONS No  FALLS:  Has patient fallen in last 6 months? No  LIVING ENVIRONMENT: Lives with: lives with their family  OCCUPATION: Patient reports he doesn't have to use his shoulder much at work  PLOF: Independent  PATIENT GOALS: Pain relief and be able to use right shoulder/arm with limitation   OBJECTIVE:  DIAGNOSTIC FINDINGS:  MRI Right Shoulder 07/21/2021: IMPRESSION: 1. Intact rotator cuff. 2. No glenoid labral tear is seen. 3. Punctate low signal focus within  the posterosuperior aspect of the joint space is favored to be iatrogenic from arthrogram injection. This may represent a tiny loose body, however there is no cartilage degenerative change seen to suggest this.  PATIENT SURVEYS:  FOTO 68% functional status  COGNITION: Overall cognitive status: Within functional limits for tasks assessed     SENSATION: WFL  POSTURE: Slight rounded  shoulder and forward head posture  UPPER EXTREMITY ROM:   Active ROM Right eval Left eval  Shoulder flexion 160 -  Shoulder abduction 165 -  Shoulder internal rotation T8 -  Shoulder external rotation 55 (T4) -  Patient reports pain with IR behind back  UPPER EXTREMITY MMT:  MMT Right eval Left eval  Shoulder flexion 5 5  Shoulder extension 5 5  Shoulder abduction 5 5  Shoulder internal rotation 5 5  Shoulder external rotation 4+ 5  Middle trapezius 4 4  Lower trapezius 4- 4  Patient reports pain with ER MMT on right  SHOULDER SPECIAL TESTS: Impingement tests: Hawkins/Kennedy impingement test: positive   JOINT MOBILITY TESTING:  WFL  PALPATION:  Tender to palpation with palpable trigger point right infraspinatus    TODAY'S TREATMENT:  [facility] Adult PT Treatment:                                                DATE: 08/22/2021 Therapeutic Exercise: Instruction and demonstration of SMFR using tennis ball to infraspinatus with cross body stretch Sleeper stretch 2 x 20 sec Doubler ER and scap retraction with blue Trigger Point Dry Needling Treatment: Pre-treatment instruction: Patient instructed on dry needling rationale, procedures, and possible side effects including pain during treatment (achy,cramping feeling), bruising, drop of blood, lightheadedness, nausea, sweating. Patient Consent Given: Yes Education handout provided: Previously provided Muscles treated: right infraspinatus  Needle size and number: .30x81mm x 4 Electrical stimulation performed: No Parameters: N/A Treatment response/outcome: Twitch response elicited, Palpable decrease in muscle tension, and patient report of improved symptoms Post-treatment instructions: Patient instructed to expect possible mild to moderate muscle soreness later today and/or tomorrow. Patient instructed in methods to reduce muscle soreness and to continue prescribed HEP. If patient was dry needled over the lung field, patient was  instructed on signs and symptoms of pneumothorax and, however unlikely, to see immediate medical attention should they occur. Patient was also educated on signs and symptoms of infection and to seek medical attention should they occur. Patient verbalized understanding of these instructions and education.   PATIENT EDUCATION: Education details: Exam findings, POC, TPDN, HEP Person educated: Patient Education method: Explanation, Demonstration, Tactile cues, Verbal cues, and Handouts Education comprehension: verbalized understanding, returned demonstration, verbal cues required, tactile cues required, and needs further education  HOME EXERCISE PROGRAM: Access Code: [email or web address]   ASSESSMENT: CLINICAL IMPRESSION: Patient is a 36 y.o. male who was seen today for physical therapy evaluation and treatment for chronic right shoulder pain. His pain mainly occurs with activity and seems consistent with rotator cuff tendinopathy vs subacromial impingement due to rotator cuff dysfunction. He exhibits increased discomfort and weakness of right infraspinatus with palpable trigger points, rounded shoulder posture with weakness of periscapular musculature. Performed TPDN this visit with good benefit and patient provided HEP to address impairments.   OBJECTIVE IMPAIRMENTS decreased strength, postural dysfunction, and pain.   ACTIVITY LIMITATIONS lifting  PARTICIPATION LIMITATIONS: cleaning, occupation, and yard work  PERSONAL FACTORS Time since  onset of injury/illness/exacerbation are also affecting patient's functional outcome.   REHAB POTENTIAL: Good  CLINICAL DECISION MAKING: Stable/uncomplicated  EVALUATION COMPLEXITY: Low   GOALS: Goals reviewed with patient? Yes  SHORT TERM GOALS: Target date: 09/12/2021   Patient will be I with initial HEP in order to progress with therapy. Baseline: HEP provided at eval Goal status: INITIAL  2.  PT will review FOTO with patient by 3rd visit in order to  understand expected progress and outcome with therapy. Baseline: FOTO assessed at eval Goal status: INITIAL  3.  Patient will report pain no more than 5/10 with activity in order to reduce functional limitations and indicate improved activity tolerance. Baseline: 7-8/10 Goal status: INITIAL  LONG TERM GOALS: Target date: 10/03/2021    Patient will be I with final HEP to maintain progress from PT. Baseline: HEP provided at eval Goal status: INITIAL  2.  Patient will report >/= 77% status on FOTO to indicate improved functional ability. Baseline: 68% Goal status: INITIAL  3.  Patent will exhibit right rotator cuff strength 5/5 and periscapular strength >/= 4/5 MMT in order to improve lifting ability with the right arm Baseline: right rotator cuff 4+/5, periscapular 4-/5 MMT Goal status: INITIAL  4.  Patient will report no pain or limitation with lifting using the right arm or with reaching behind back. Baseline: patient reports increased right shoulder with lifting and reaching behind back Goal status: INITIAL   PLAN: PT FREQUENCY: 1x/week  PT DURATION: 6 weeks  PLANNED INTERVENTIONS: Therapeutic exercises, Therapeutic activity, Neuromuscular re-education, Balance training, Gait training, Patient/Family education, Joint manipulation, Joint mobilization, Aquatic Therapy, Dry Needling, Electrical stimulation, Spinal manipulation, Spinal mobilization, Cryotherapy, Moist heat, Taping, Ionotophoresis 4mg /ml Dexamethasone, Manual therapy, and Re-evaluation  PLAN FOR NEXT SESSION: Review HEP and progress PRN, dry needling/manual for right shoulder infraspinatus, progress rotator cuff and postural strengthening, endurance for rotator cuff   Hilda Blades, PT, DPT, LAT, ATC 08/22/21  12:33 PM Phone: [phone] Fax: [phone]

## 2021-08-23 MED ORDER — VALACYCLOVIR HCL 1 G PO TABS: 1000.0000 mg | ORAL_TABLET | Freq: Two times a day (BID) | ORAL | 0 refills | Status: AC | PRN

## 2021-08-23 NOTE — Addendum Note (Signed)
Addended by: Arta Silence on: 08/23/2021 08:47 AM   Modules accepted: Orders

## 2021-08-29 ENCOUNTER — Ambulatory Visit: Payer: BC Managed Care – PPO | Admitting: Sports Medicine

## 2021-08-30 ENCOUNTER — Ambulatory Visit: Payer: BC Managed Care – PPO | Attending: Sports Medicine

## 2021-08-30 ENCOUNTER — Other Ambulatory Visit: Payer: Self-pay | Admitting: Family Medicine

## 2021-08-30 DIAGNOSIS — G8929 Other chronic pain: Secondary | ICD-10-CM | POA: Diagnosis not present

## 2021-08-30 DIAGNOSIS — M25511 Pain in right shoulder: Secondary | ICD-10-CM | POA: Insufficient documentation

## 2021-08-30 DIAGNOSIS — M6281 Muscle weakness (generalized): Secondary | ICD-10-CM | POA: Diagnosis not present

## 2021-08-30 NOTE — Therapy (Signed)
OUTPATIENT PHYSICAL THERAPY TREATMENT NOTE   Patient Name: Terry [name] MRN: [id number] DOB:1986/02/22, 36 y.o., male Today's Date: 08/30/2021  PCP: Susy Frizzle, MD REFERRING PROVIDER: Glennon Mac, DO  END OF SESSION:   PT End of Session - 08/30/21 0845     Visit Number 2    Number of Visits 6    Date for PT Re-Evaluation 10/03/21    Authorization Type BCBS    PT Start Time 0845    PT Stop Time 0928    PT Time Calculation (min) 43 min    Activity Tolerance Patient tolerated treatment well    Behavior During Therapy [facility] for tasks assessed/performed             Past Medical History:  Diagnosis Date   Acute appendicitis 02/08/2013   ADHD (attention deficit hyperactivity disorder)    Allergic rhinitis    Appendicitis, acute 02/08/2013   Depression    since 8th grade, hospitalized 2015 ([place])   HSV infection    Past Surgical History:  Procedure Laterality Date   APPENDECTOMY     2013   LAPAROSCOPIC APPENDECTOMY N/A 02/08/2013   Procedure: APPENDECTOMY LAPAROSCOPIC;  Surgeon: Earnstine Regal, MD;  Location: [facility];  Service: General;  Laterality: N/A;   WISDOM TOOTH EXTRACTION     Patient Active Problem List   Diagnosis Date Noted   Spinal stenosis of lumbar region 01/03/2021   Somatic dysfunction of spine, lumbar 08/10/2020   Chronic left-sided low back pain with left-sided sciatica 12/01/2019   Genital herpes simplex 09/24/2018   Overweight (BMI 25.0-29.9) 09/24/2018   Bipolar affective disorder, current episode depressed ([place]) 09/24/2018   Generalized anxiety disorder 09/24/2018   Social anxiety disorder 09/24/2018   Insomnia 09/24/2018   Recurrent major depression resistant to treatment ([place]) 09/24/2018   ADD (attention deficit disorder) without hyperactivity 09/24/2018   Elevated liver enzymes 09/24/2018   Drug-induced erectile dysfunction 09/24/2018   ATTENTION DEFICIT HYPERACTIVITY DISORDER 03/04/2007   Seasonal and perennial allergic  rhinitis 03/04/2007    REFERRING DIAG: Chronic right shoulder pain Right rotator cuff tendonitis    THERAPY DIAG:  Chronic right shoulder pain  Muscle weakness (generalized)  Rationale for Evaluation and Treatment Rehabilitation  PERTINENT HISTORY: none   PRECAUTIONS: none   SUBJECTIVE: Patient reports the shoulder is a little bit better. Three days after the last session he did some lifting with the shoulder and that flared it up, but it has subsided some since this incident.   PAIN:  Are you having pain? Yes: NPRS scale: 2/10 Pain location: Rt posterior shoulder Pain description: dul Aggravating factors: overuse Relieving factors: rest   OBJECTIVE: (objective measures completed at initial evaluation unless otherwise dated) DIAGNOSTIC FINDINGS:  MRI Right Shoulder 07/21/2021: IMPRESSION: 1. Intact rotator cuff. 2. No glenoid labral tear is seen. 3. Punctate low signal focus within the posterosuperior aspect of the joint space is favored to be iatrogenic from arthrogram injection. This may represent a tiny loose body, however there is no cartilage degenerative change seen to suggest this.   PATIENT SURVEYS:  FOTO 68% functional status   COGNITION: Overall cognitive status: Within functional limits for tasks assessed                                  SENSATION: WFL   POSTURE: Slight rounded shoulder and forward head posture   UPPER EXTREMITY ROM:    Active ROM Right  eval Left eval Right 08/30/21  Shoulder flexion 160 -   Shoulder abduction 165 -   Shoulder internal rotation T8 - T8 pain  Shoulder external rotation 55 (T4) -   Patient reports pain with IR behind back   UPPER EXTREMITY MMT:   MMT Right eval Left eval  Shoulder flexion 5 5  Shoulder extension 5 5  Shoulder abduction 5 5  Shoulder internal rotation 5 5  Shoulder external rotation 4+ 5  Middle trapezius 4 4  Lower trapezius 4- 4  Patient reports pain with ER MMT on right   SHOULDER  SPECIAL TESTS: Impingement tests: Hawkins/Kennedy impingement test: positive    JOINT MOBILITY TESTING:  WFL   PALPATION:  Tender to palpation with palpable trigger point right infraspinatus               TODAY'S TREATMENT:  [facility] Adult PT Treatment:                                                DATE: 08/30/21 Therapeutic Exercise: Sleeper stretch 2 x 30 sec  Prone I 2 x 10  Pec doorway stretch 2 x 30 sec  Resisted shoulder extension green band 2 x 15  Resisted rows black band 2 x 15 Serratus wall slides 2 x 10  Bilateral shoulder ER green band 2 x 10  Resisted shoulder adduction green band 2 x 10  Updated HEP  Manual Therapy: STM/DTM Rt rotator cuff musculature, upper trap, lat   Trigger Point Dry Needling Treatment: Pre-treatment instruction: Patient instructed on dry needling rationale, procedures, and possible side effects including pain during treatment (achy,cramping feeling), bruising, drop of blood, lightheadedness, nausea, sweating. Patient Consent Given: Yes Education handout provided: Previously provided Muscles treated: right infraspinatus, teres minor, subscapularis Needle size and number: .30x48mm Electrical stimulation performed: No Parameters: N/A Treatment response/outcome: Twitch response elicited, Palpable decrease in muscle tension, and patient report of improved symptoms Post-treatment instructions: Patient instructed to expect possible mild to moderate muscle soreness later today and/or tomorrow. Patient instructed in methods to reduce muscle soreness and to continue prescribed HEP. If patient was dry needled over the lung field, patient was instructed on signs and symptoms of pneumothorax and, however unlikely, to see immediate medical attention should they occur. Patient was also educated on signs and symptoms of infection and to seek medical attention should they occur. Patient verbalized understanding of these instructions and education.  [facility] Adult PT  Treatment:                                                DATE: 08/22/2021 Therapeutic Exercise: Instruction and demonstration of SMFR using tennis ball to infraspinatus with cross body stretch Sleeper stretch 2 x 20 sec Doubler ER and scap retraction with blue Trigger Point Dry Needling Treatment: Pre-treatment instruction: Patient instructed on dry needling rationale, procedures, and possible side effects including pain during treatment (achy,cramping feeling), bruising, drop of blood, lightheadedness, nausea, sweating. Patient Consent Given: Yes Education handout provided: Previously provided Muscles treated: right infraspinatus  Needle size and number: .30x19mm x 4 Electrical stimulation performed: No Parameters: N/A Treatment response/outcome: Twitch response elicited, Palpable decrease in muscle tension, and patient report of improved symptoms Post-treatment instructions: Patient  instructed to expect possible mild to moderate muscle soreness later today and/or tomorrow. Patient instructed in methods to reduce muscle soreness and to continue prescribed HEP. If patient was dry needled over the lung field, patient was instructed on signs and symptoms of pneumothorax and, however unlikely, to see immediate medical attention should they occur. Patient was also educated on signs and symptoms of infection and to seek medical attention should they occur. Patient verbalized understanding of these instructions and education.     PATIENT EDUCATION: Education details: HEP Person educated: Patient Education method: Solicitor, Actor cues, Verbal cues, and Handouts Education comprehension: verbalized understanding, returned demonstration, verbal cues required, tactile cues required, and needs further education   HOME EXERCISE PROGRAM: Access Code: [email or web address]     ASSESSMENT: CLINICAL IMPRESSION: Patient tolerated session well today with continued TPDN to the Rt shoulder and  progression of periscapular strengthening. Twitch responses elicited with TPDN of the infraspinatus and subscapularis with patient reporting a reduction in palpable tenderness following intervention. Moderate postural cues required with strengthening. Soreness reported at end of session.    OBJECTIVE IMPAIRMENTS decreased strength, postural dysfunction, and pain.    ACTIVITY LIMITATIONS lifting   PARTICIPATION LIMITATIONS: cleaning, occupation, and yard work   PERSONAL FACTORS Time since onset of injury/illness/exacerbation are also affecting patient's functional outcome.    REHAB POTENTIAL: Good   CLINICAL DECISION MAKING: Stable/uncomplicated   EVALUATION COMPLEXITY: Low     GOALS: Goals reviewed with patient? Yes   SHORT TERM GOALS: Target date: 09/12/2021    Patient will be I with initial HEP in order to progress with therapy. Baseline: HEP provided at eval Goal status: INITIAL   2.  PT will review FOTO with patient by 3rd visit in order to understand expected progress and outcome with therapy. Baseline: FOTO assessed at eval Goal status: INITIAL   3.  Patient will report pain no more than 5/10 with activity in order to reduce functional limitations and indicate improved activity tolerance. Baseline: 7-8/10 Goal status: INITIAL   LONG TERM GOALS: Target date: 10/03/2021     Patient will be I with final HEP to maintain progress from PT. Baseline: HEP provided at eval Goal status: INITIAL   2.  Patient will report >/= 77% status on FOTO to indicate improved functional ability. Baseline: 68% Goal status: INITIAL   3.  Patent will exhibit right rotator cuff strength 5/5 and periscapular strength >/= 4/5 MMT in order to improve lifting ability with the right arm Baseline: right rotator cuff 4+/5, periscapular 4-/5 MMT Goal status: INITIAL   4.  Patient will report no pain or limitation with lifting using the right arm or with reaching behind back. Baseline: patient  reports increased right shoulder with lifting and reaching behind back Goal status: INITIAL     PLAN: PT FREQUENCY: 1x/week   PT DURATION: 6 weeks   PLANNED INTERVENTIONS: Therapeutic exercises, Therapeutic activity, Neuromuscular re-education, Balance training, Gait training, Patient/Family education, Joint manipulation, Joint mobilization, Aquatic Therapy, Dry Needling, Electrical stimulation, Spinal manipulation, Spinal mobilization, Cryotherapy, Moist heat, Taping, Ionotophoresis 4mg /ml Dexamethasone, Manual therapy, and Re-evaluation   PLAN FOR NEXT SESSION: Review HEP and progress PRN, dry needling/manual for right shoulder rotator cuff, progress rotator cuff and postural strengthening, endurance for rotator cuff  , PT, DPT, ATC 08/30/21 9:32 AM

## 2021-09-04 NOTE — Therapy (Signed)
OUTPATIENT PHYSICAL THERAPY TREATMENT NOTE   Patient Name: Terry [name] MRN: [id number] DOB:1985/04/03, 36 y.o., male Today's Date: 09/05/2021  PCP: [name], MD REFERRING PROVIDER: Richardean Sale, DO   END OF SESSION:   PT End of Session - 09/05/21 1000     Visit Number 3    Number of Visits 6    Date for PT Re-Evaluation 10/03/21    Authorization Type BCBS    PT Start Time 0915    PT Stop Time 1000    PT Time Calculation (min) 45 min    Activity Tolerance Patient tolerated treatment well    Behavior During Therapy [facility] for tasks assessed/performed              Past Medical History:  Diagnosis Date   Acute appendicitis 02/08/2013   ADHD (attention deficit hyperactivity disorder)    Allergic rhinitis    Appendicitis, acute 02/08/2013   Depression    since 8th grade, hospitalized 2015 ([place])   HSV infection    Past Surgical History:  Procedure Laterality Date   APPENDECTOMY     2013   LAPAROSCOPIC APPENDECTOMY N/A 02/08/2013   Procedure: APPENDECTOMY LAPAROSCOPIC;  Surgeon: Velora Heckler, MD;  Location: [facility];  Service: General;  Laterality: N/A;   WISDOM TOOTH EXTRACTION     Patient Active Problem List   Diagnosis Date Noted   Spinal stenosis of lumbar region 01/03/2021   Somatic dysfunction of spine, lumbar 08/10/2020   Chronic left-sided low back pain with left-sided sciatica 12/01/2019   Genital herpes simplex 09/24/2018   Overweight (BMI 25.0-29.9) 09/24/2018   Bipolar affective disorder, current episode depressed (HCC) 09/24/2018   Generalized anxiety disorder 09/24/2018   Social anxiety disorder 09/24/2018   Insomnia 09/24/2018   Recurrent major depression resistant to treatment (HCC) 09/24/2018   ADD (attention deficit disorder) without hyperactivity 09/24/2018   Elevated liver enzymes 09/24/2018   Drug-induced erectile dysfunction 09/24/2018   ATTENTION DEFICIT HYPERACTIVITY DISORDER 03/04/2007   Seasonal and perennial allergic  rhinitis 03/04/2007    REFERRING DIAG: Chronic right shoulder pain, Right rotator cuff tendonitis    THERAPY DIAG:  Chronic right shoulder pain  Muscle weakness (generalized)  Rationale for Evaluation and Treatment Rehabilitation  PERTINENT HISTORY: None   PRECAUTIONS: None    SUBJECTIVE: Patient reports the back of the shoulder pain and tightness has improved but he has noticed pain on top of the shoulder especially when reaching out. He still feels limited with range of motion.  PAIN:  Are you having pain? Yes: NPRS scale: 1/10 Pain location: Right shoulder Pain description: Dull Aggravating factors: Overuse Relieving factors: Rest   OBJECTIVE: (objective measures completed at initial evaluation unless otherwise dated) PATIENT SURVEYS:  FOTO 68% functional status   POSTURE: Slight rounded shoulder and forward head posture   UPPER EXTREMITY ROM:    Active ROM Right eval Left eval Right 08/30/21  Shoulder flexion 160 -   Shoulder abduction 165 -   Shoulder internal rotation T8 - T8 pain  Shoulder external rotation 55 (T4) -   Patient reports pain with IR behind back   UPPER EXTREMITY MMT:   MMT Right eval Left eval  Shoulder flexion 5 5  Shoulder extension 5 5  Shoulder abduction 5 5  Shoulder internal rotation 5 5  Shoulder external rotation 4+ 5  Middle trapezius 4 4  Lower trapezius 4- 4  Patient reports pain with ER MMT on right   SHOULDER SPECIAL TESTS: Impingement tests: Hawkins/Kennedy  impingement test: positive    JOINT MOBILITY TESTING:  WFL   PALPATION:  Tender to palpation with palpable trigger point right infraspinatus               TODAY'S TREATMENT:  [facility] Adult PT Treatment:                                                DATE: 09/05/21 Therapeutic Exercise: Prone I, T, Y, ER with 3# 2 x 10 each (right only) Row with black 2 x 15 ER with green 2 x 15 (right only) Push-up plus from table 2 x 10 Manual Therapy: Skilled palpation  and monitoring of muscle tension while performing TPDN treatment Scap pinned GHJ stretching for elevation and cross body posterior cuff Trigger Point Dry Needling Treatment: Pre-treatment instruction: Patient instructed on dry needling rationale, procedures, and possible side effects including pain during treatment (achy,cramping feeling), bruising, drop of blood, lightheadedness, nausea, sweating. Patient Consent Given: Yes Education handout provided: Previously provided Muscles treated: right teres minor/major, middle and posterior deltoid Needle size and number: .30x80mm Electrical stimulation performed: No Parameters: N/A Treatment response/outcome: Twitch response elicited, Palpable decrease in muscle tension, and patient report of improved symptoms Post-treatment instructions: Patient instructed to expect possible mild to moderate muscle soreness later today and/or tomorrow. Patient instructed in methods to reduce muscle soreness and to continue prescribed HEP. If patient was dry needled over the lung field, patient was instructed on signs and symptoms of pneumothorax and, however unlikely, to see immediate medical attention should they occur. Patient was also educated on signs and symptoms of infection and to seek medical attention should they occur. Patient verbalized understanding of these instructions and education.   [facility] Adult PT Treatment:                                                DATE: 08/30/21 Therapeutic Exercise: Sleeper stretch 2 x 30 sec  Prone I 2 x 10  Pec doorway stretch 2 x 30 sec  Resisted shoulder extension green band 2 x 15  Resisted rows black band 2 x 15 Serratus wall slides 2 x 10  Bilateral shoulder ER green band 2 x 10  Resisted shoulder adduction green band 2 x 10  Updated HEP  Manual Therapy: STM/DTM Rt rotator cuff musculature, upper trap, lat Trigger Point Dry Needling Treatment: Pre-treatment instruction: Patient instructed on dry needling rationale,  procedures, and possible side effects including pain during treatment (achy,cramping feeling), bruising, drop of blood, lightheadedness, nausea, sweating. Patient Consent Given: Yes Education handout provided: Previously provided Muscles treated: right infraspinatus, teres minor, subscapularis Needle size and number: .30x11mm Electrical stimulation performed: No Parameters: N/A Treatment response/outcome: Twitch response elicited, Palpable decrease in muscle tension, and patient report of improved symptoms Post-treatment instructions: Patient instructed to expect possible mild to moderate muscle soreness later today and/or tomorrow. Patient instructed in methods to reduce muscle soreness and to continue prescribed HEP. If patient was dry needled over the lung field, patient was instructed on signs and symptoms of pneumothorax and, however unlikely, to see immediate medical attention should they occur. Patient was also educated on signs and symptoms of infection and to seek medical attention should they occur. Patient verbalized  understanding of these instructions and education.  [facility] Adult PT Treatment:                                                DATE: 08/22/2021 Therapeutic Exercise: Instruction and demonstration of SMFR using tennis ball to infraspinatus with cross body stretch Sleeper stretch 2 x 20 sec Doubler ER and scap retraction with blue  Trigger Point Dry Needling Treatment: Pre-treatment instruction: Patient instructed on dry needling rationale, procedures, and possible side effects including pain during treatment (achy,cramping feeling), bruising, drop of blood, lightheadedness, nausea, sweating. Patient Consent Given: Yes Education handout provided: Previously provided Muscles treated: right infraspinatus  Needle size and number: .30x58mm x 4 Electrical stimulation performed: No Parameters: N/A Treatment response/outcome: Twitch response elicited, Palpable decrease in muscle  tension, and patient report of improved symptoms Post-treatment instructions: Patient instructed to expect possible mild to moderate muscle soreness later today and/or tomorrow. Patient instructed in methods to reduce muscle soreness and to continue prescribed HEP. If patient was dry needled over the lung field, patient was instructed on signs and symptoms of pneumothorax and, however unlikely, to see immediate medical attention should they occur. Patient was also educated on signs and symptoms of infection and to seek medical attention should they occur. Patient verbalized understanding of these instructions and education.   PATIENT EDUCATION: Education details: HEP Person educated: Patient Education method: Solicitor, Actor cues, Verbal cues Education comprehension: verbalized understanding, returned demonstration, verbal cues required, tactile cues required, and needs further education   HOME EXERCISE PROGRAM: Access Code: [email or web address]     ASSESSMENT: CLINICAL IMPRESSION: Patient tolerated therapy well with no adverse effects. Continued with TPDN this visit with twitch responses elicited and patient reporting good therapeutic benefit. Therapy continues to focus on progressing shoulder mobility and rotator cuff and periscapular strength. Performed more isolation work for muscle strengthening this visit. He did not report any increased pain with therapy but did require cueing for postural control with exercises. No changes to HEP. Patient would benefit from continued skilled PT to progress mobility and strength in order to reduce pain and maximize functional ability.     OBJECTIVE IMPAIRMENTS decreased strength, postural dysfunction, and pain.    ACTIVITY LIMITATIONS lifting   PARTICIPATION LIMITATIONS: cleaning, occupation, and yard work   PERSONAL FACTORS Time since onset of injury/illness/exacerbation are also affecting patient's functional outcome.      GOALS: Goals  reviewed with patient? Yes   SHORT TERM GOALS: Target date: 09/12/2021    Patient will be I with initial HEP in order to progress with therapy. Baseline: HEP provided at eval Goal status: INITIAL   2.  PT will review FOTO with patient by 3rd visit in order to understand expected progress and outcome with therapy. Baseline: FOTO assessed at eval Goal status: INITIAL   3.  Patient will report pain no more than 5/10 with activity in order to reduce functional limitations and indicate improved activity tolerance. Baseline: 7-8/10 Goal status: INITIAL   LONG TERM GOALS: Target date: 10/03/2021     Patient will be I with final HEP to maintain progress from PT. Baseline: HEP provided at eval Goal status: INITIAL   2.  Patient will report >/= 77% status on FOTO to indicate improved functional ability. Baseline: 68% Goal status: INITIAL   3.  Patent will exhibit right rotator cuff strength 5/5  and periscapular strength >/= 4/5 MMT in order to improve lifting ability with the right arm Baseline: right rotator cuff 4+/5, periscapular 4-/5 MMT Goal status: INITIAL   4.  Patient will report no pain or limitation with lifting using the right arm or with reaching behind back. Baseline: patient reports increased right shoulder with lifting and reaching behind back Goal status: INITIAL     PLAN: PT FREQUENCY: 1x/week   PT DURATION: 6 weeks   PLANNED INTERVENTIONS: Therapeutic exercises, Therapeutic activity, Neuromuscular re-education, Balance training, Gait training, Patient/Family education, Joint manipulation, Joint mobilization, Aquatic Therapy, Dry Needling, Electrical stimulation, Spinal manipulation, Spinal mobilization, Cryotherapy, Moist heat, Taping, Ionotophoresis 4mg /ml Dexamethasone, Manual therapy, and Re-evaluation   PLAN FOR NEXT SESSION: Review HEP and progress PRN, dry needling/manual for right shoulder rotator cuff, progress rotator cuff and postural strengthening, endurance  for rotator cuff   , PT, DPT, LAT, ATC 09/05/21  10:01 AM Phone: [phone] Fax: [phone]

## 2021-09-05 ENCOUNTER — Encounter: Payer: Self-pay | Admitting: Physical Therapy

## 2021-09-05 ENCOUNTER — Other Ambulatory Visit: Payer: Self-pay

## 2021-09-05 ENCOUNTER — Ambulatory Visit: Payer: BC Managed Care – PPO | Admitting: Physical Therapy

## 2021-09-05 DIAGNOSIS — M6281 Muscle weakness (generalized): Secondary | ICD-10-CM | POA: Diagnosis not present

## 2021-09-05 DIAGNOSIS — G8929 Other chronic pain: Secondary | ICD-10-CM | POA: Diagnosis not present

## 2021-09-05 DIAGNOSIS — M25511 Pain in right shoulder: Secondary | ICD-10-CM | POA: Diagnosis not present

## 2021-09-11 NOTE — Therapy (Signed)
OUTPATIENT PHYSICAL THERAPY TREATMENT NOTE   Patient Name: Terry [name] MRN: [id number] DOB:1985/03/31, 36 y.o., male Today's Date: 09/12/2021  PCP: Susy Frizzle, MD REFERRING PROVIDER: Glennon Mac, DO   END OF SESSION:   PT End of Session - 09/12/21 0915     Visit Number 4    Number of Visits 6    Date for PT Re-Evaluation 10/03/21    Authorization Type BCBS    PT Start Time 0915    PT Stop Time 1000    PT Time Calculation (min) 45 min    Activity Tolerance Patient tolerated treatment well    Behavior During Therapy [facility] for tasks assessed/performed               Past Medical History:  Diagnosis Date   Acute appendicitis 02/08/2013   ADHD (attention deficit hyperactivity disorder)    Allergic rhinitis    Appendicitis, acute 02/08/2013   Depression    since 8th grade, hospitalized 2015 ([place])   HSV infection    Past Surgical History:  Procedure Laterality Date   APPENDECTOMY     2013   LAPAROSCOPIC APPENDECTOMY N/A 02/08/2013   Procedure: APPENDECTOMY LAPAROSCOPIC;  Surgeon: Earnstine Regal, MD;  Location: [facility];  Service: General;  Laterality: N/A;   WISDOM TOOTH EXTRACTION     Patient Active Problem List   Diagnosis Date Noted   Spinal stenosis of lumbar region 01/03/2021   Somatic dysfunction of spine, lumbar 08/10/2020   Chronic left-sided low back pain with left-sided sciatica 12/01/2019   Genital herpes simplex 09/24/2018   Overweight (BMI 25.0-29.9) 09/24/2018   Bipolar affective disorder, current episode depressed ([place]) 09/24/2018   Generalized anxiety disorder 09/24/2018   Social anxiety disorder 09/24/2018   Insomnia 09/24/2018   Recurrent major depression resistant to treatment ([place]) 09/24/2018   ADD (attention deficit disorder) without hyperactivity 09/24/2018   Elevated liver enzymes 09/24/2018   Drug-induced erectile dysfunction 09/24/2018   ATTENTION DEFICIT HYPERACTIVITY DISORDER 03/04/2007   Seasonal and perennial  allergic rhinitis 03/04/2007    REFERRING DIAG: Chronic right shoulder pain, Right rotator cuff tendonitis    THERAPY DIAG:  Chronic right shoulder pain  Muscle weakness (generalized)  Rationale for Evaluation and Treatment Rehabilitation  PERTINENT HISTORY: None   PRECAUTIONS: None    SUBJECTIVE: Patient reports he is doing well. States the shoulder is better, he did do a little more lifting yesterday which  caused it to hurt some in the back of the shoulder.  PAIN:  Are you having pain? Yes:  NPRS scale: 1/10 Pain location: Right shoulder Pain description: Dull Aggravating factors: Overuse Relieving factors: Rest   OBJECTIVE: (objective measures completed at initial evaluation unless otherwise dated) PATIENT SURVEYS:  FOTO 68% functional status   POSTURE: Slight rounded shoulder and forward head posture   UPPER EXTREMITY ROM:    Active ROM Right eval Left eval Right 08/30/21  Shoulder flexion 160 -   Shoulder abduction 165 -   Shoulder internal rotation T8 - T8 pain  Shoulder external rotation 55 (T4) -   Patient reports pain with IR behind back   UPPER EXTREMITY MMT:   MMT Right eval Left eval  Shoulder flexion 5 5  Shoulder extension 5 5  Shoulder abduction 5 5  Shoulder internal rotation 5 5  Shoulder external rotation 4+ 5  Middle trapezius 4 4  Lower trapezius 4- 4  Patient reports pain with ER MMT on right   SHOULDER SPECIAL TESTS: Impingement tests: Hawkins/Kennedy  impingement test: positive    JOINT MOBILITY TESTING:  WFL   PALPATION:  Tender to palpation with palpable trigger point right infraspinatus               TODAY'S TREATMENT:  [facility] Adult PT Treatment:                                                DATE: 09/12/21 Therapeutic Exercise: UBE L5 x 4 min (2 fwd/bwd) while taking subjective Machine low row 55# 3 x 15 Prone ER with 3# x 15, 5# 2 x 15 (right only) Scapular protraction/retraction in bear crawl position 3 x  10 Standing band ER/IR with blue 2 x 15 each Review of updated HEP Manual Therapy: Skilled palpation and monitoring of muscle tension while performing TPDN treatment STM for right posterior cuff Scap pinned GHJ stretching for elevation and cross body posterior cuff Trigger Point Dry Needling Treatment: Pre-treatment instruction: Patient instructed on dry needling rationale, procedures, and possible side effects including pain during treatment (achy,cramping feeling), bruising, drop of blood, lightheadedness, nausea, sweating. Patient Consent Given: Yes Education handout provided: Previously provided Muscles treated: right infraspinatus, teres minor/major, upper trap, posterior deltoid Needle size and number: .30x19m Electrical stimulation performed: No Parameters: N/A Treatment response/outcome: Twitch response elicited, Palpable decrease in muscle tension, and patient report of improved symptoms Post-treatment instructions: Patient instructed to expect possible mild to moderate muscle soreness later today and/or tomorrow. Patient instructed in methods to reduce muscle soreness and to continue prescribed HEP. If patient was dry needled over the lung field, patient was instructed on signs and symptoms of pneumothorax and, however unlikely, to see immediate medical attention should they occur. Patient was also educated on signs and symptoms of infection and to seek medical attention should they occur. Patient verbalized understanding of these instructions and education.   [facility]                                                [facility]: [facility] Therapeutic Exercise: Prone I, T, Y, ER with 3# 2 x 10 each (right only) Row with black 2 x 15 ER with green 2 x 15 (right only) Push-up plus from table 2 x 10 Manual Therapy: Skilled palpation and monitoring of muscle tension while performing TPDN treatment Scap pinned GHJ stretching for elevation and cross body posterior cuff Trigger  Point Dry Needling Treatment: Pre-treatment instruction: Patient instructed on dry needling rationale, procedures, and possible side effects including pain during treatment (achy,cramping feeling), bruising, drop of blood, lightheadedness, nausea, sweating. Patient Consent Given: Yes Education handout provided: Previously provided Muscles treated: right teres minor/major, middle and posterior deltoid Needle size and number: .30x532mElectrical stimulation performed: No Parameters: N/A Treatment response/outcome: Twitch response elicited, Palpable decrease in muscle tension, and patient report of improved symptoms Post-treatment instructions: Patient instructed to expect possible mild to moderate muscle soreness later today and/or tomorrow. Patient instructed in methods to reduce muscle soreness and to continue prescribed HEP. If patient was dry needled over the lung field, patient was instructed on signs and symptoms of pneumothorax and, however unlikely, to see immediate medical attention should they occur. Patient was also educated on signs and symptoms of infection and to  seek medical attention should they occur. Patient verbalized understanding of these instructions and education.  [facility] Adult PT Treatment:                                                DATE: 08/30/21 Therapeutic Exercise: Sleeper stretch 2 x 30 sec  Prone I 2 x 10  Pec doorway stretch 2 x 30 sec  Resisted shoulder extension green band 2 x 15  Resisted rows black band 2 x 15 Serratus wall slides 2 x 10  Bilateral shoulder ER green band 2 x 10  Resisted shoulder adduction green band 2 x 10  Updated HEP  Manual Therapy: STM/DTM Rt rotator cuff musculature, upper trap, lat Trigger Point Dry Needling Treatment: Pre-treatment instruction: Patient instructed on dry needling rationale, procedures, and possible side effects including pain during treatment (achy,cramping feeling), bruising, drop of blood, lightheadedness, nausea,  sweating. Patient Consent Given: Yes Education handout provided: Previously provided Muscles treated: right infraspinatus, teres minor, subscapularis Needle size and number: .30x62m Electrical stimulation performed: No Parameters: N/A Treatment response/outcome: Twitch response elicited, Palpable decrease in muscle tension, and patient report of improved symptoms Post-treatment instructions: Patient instructed to expect possible mild to moderate muscle soreness later today and/or tomorrow. Patient instructed in methods to reduce muscle soreness and to continue prescribed HEP. If patient was dry needled over the lung field, patient was instructed on signs and symptoms of pneumothorax and, however unlikely, to see immediate medical attention should they occur. Patient was also educated on signs and symptoms of infection and to seek medical attention should they occur. Patient verbalized understanding of these instructions and education.   PATIENT EDUCATION: Education details: HEP update, TPDN Person educated: Patient Education method: Explanation, Demonstration, Tactile cues, Verbal cues, Handouts Education comprehension: verbalized understanding, returned demonstration, verbal cues required, tactile cues required, and needs further education   HOME EXERCISE PROGRAM: Access Code: [email or web address]     ASSESSMENT: CLINICAL IMPRESSION: Patient tolerated therapy well with no adverse effects. Therapy continues to incorporate TPDN for the right shoulder with good therapeutic benefit. Therapy focused on progression of cuff and  scapular strengthening this visit and updated HEP with good tolerance. He does require occasional cues to avoid shug and upper trap tensioning. Patient would benefit from continued skilled PT to progress mobility and strength in order to reduce pain and maximize functional ability.    OBJECTIVE IMPAIRMENTS decreased strength, postural dysfunction, and pain.    ACTIVITY LIMITATIONS  lifting   PARTICIPATION LIMITATIONS: cleaning, occupation, and yard work   PERSONAL FACTORS Time since onset of injury/illness/exacerbation are also affecting patient's functional outcome.      GOALS: Goals reviewed with patient? Yes   SHORT TERM GOALS: Target date: 09/12/2021    Patient will be I with initial HEP in order to progress with therapy. Baseline: HEP provided at eval 09/12/2021: independent with initial HEP Goal status: MET   2.  PT will review FOTO with patient by 3rd visit in order to understand expected progress and outcome with therapy. Baseline: FOTO assessed at eval 09/12/2021: reviewed Goal status: MET   3.  Patient will report pain no more than 5/10 with activity in order to reduce functional limitations and indicate improved activity tolerance. Baseline: 7-8/10 09/12/2021: reports < 5/10 pain Goal status: MET   LONG TERM GOALS: Target date: 10/03/2021     Patient will  be I with final HEP to maintain progress from PT. Baseline: HEP provided at eval Goal status: INITIAL   2.  Patient will report >/= 77% status on FOTO to indicate improved functional ability. Baseline: 68% Goal status: INITIAL   3.  Patent will exhibit right rotator cuff strength 5/5 and periscapular strength >/= 4/5 MMT in order to improve lifting ability with the right arm Baseline: right rotator cuff 4+/5, periscapular 4-/5 MMT Goal status: INITIAL   4.  Patient will report no pain or limitation with lifting using the right arm or with reaching behind back. Baseline: patient reports increased right shoulder with lifting and reaching behind back Goal status: INITIAL     PLAN: PT FREQUENCY: 1x/week   PT DURATION: 6 weeks   PLANNED INTERVENTIONS: Therapeutic exercises, Therapeutic activity, Neuromuscular re-education, Balance training, Gait training, Patient/Family education, Joint manipulation, Joint mobilization, Aquatic Therapy, Dry Needling, Electrical stimulation, Spinal  manipulation, Spinal mobilization, Cryotherapy, Moist heat, Taping, Ionotophoresis 53m/ml Dexamethasone, Manual therapy, and Re-evaluation   PLAN FOR NEXT SESSION: Review HEP and progress PRN, dry needling/manual for right shoulder rotator cuff, progress rotator cuff and postural strengthening, endurance for rotator cuff   CHilda Blades PT, DPT, LAT, ATC 09/12/21  10:01 AM Phone: [phone]: [phone]

## 2021-09-12 ENCOUNTER — Ambulatory Visit: Payer: BC Managed Care – PPO | Admitting: Physical Therapy

## 2021-09-12 ENCOUNTER — Encounter: Payer: Self-pay | Admitting: Physical Therapy

## 2021-09-12 ENCOUNTER — Other Ambulatory Visit: Payer: Self-pay

## 2021-09-12 DIAGNOSIS — M6281 Muscle weakness (generalized): Secondary | ICD-10-CM | POA: Diagnosis not present

## 2021-09-12 DIAGNOSIS — G8929 Other chronic pain: Secondary | ICD-10-CM | POA: Diagnosis not present

## 2021-09-12 DIAGNOSIS — M25511 Pain in right shoulder: Secondary | ICD-10-CM | POA: Diagnosis not present

## 2021-09-12 NOTE — Patient Instructions (Signed)
Access Code: [email or web address] URL: [email or web address] Date: 09/12/2021 Prepared by: Rosana Hoes  Exercises - Standing Upper Trapezius Mobilization with Small Ball  - 1-2 x daily - Sleeper Stretch  - 1-2 x daily - 3 reps - 30 seconds hold - Doorway Pec Stretch at 90 Degrees Abduction  - 1-2 x daily - 3 reps - 30 sec  hold - Prone Single Arm Shoulder Y  - 1 x daily - 2 sets - 15 reps - Prone Shoulder Extension - Single Arm  - 1 x daily - 2 sets - 15 reps - Prone Shoulder Horizontal Abduction  - 1 x daily - 2 sets - 15 reps - Prone Shoulder External Rotation  - 1 x daily - 3 sets - 15 reps - Quadruped Scapular Protraction and Retraction  - 1 x daily - 3 sets - 10 reps - Standing Shoulder Row with Anchored Resistance  - 1 x daily - 3 sets - 15 reps - Shoulder External Rotation with Anchored Resistance  - 1 x daily - 3 sets - 15 reps - Shoulder Internal Rotation with Resistance  - 1 x daily - 7 x weekly - 3 sets - 15 reps

## 2021-09-19 ENCOUNTER — Ambulatory Visit: Payer: BC Managed Care – PPO

## 2021-09-19 DIAGNOSIS — G8929 Other chronic pain: Secondary | ICD-10-CM

## 2021-09-19 DIAGNOSIS — M25511 Pain in right shoulder: Secondary | ICD-10-CM | POA: Diagnosis not present

## 2021-09-19 DIAGNOSIS — M6281 Muscle weakness (generalized): Secondary | ICD-10-CM

## 2021-09-19 DIAGNOSIS — F332 Major depressive disorder, recurrent severe without psychotic features: Secondary | ICD-10-CM | POA: Diagnosis not present

## 2021-09-19 NOTE — Therapy (Signed)
OUTPATIENT PHYSICAL THERAPY TREATMENT NOTE   Patient Name: Terry [name] MRN: [id number] DOB:06/29/1985, 36 y.o., male Today's Date: 09/19/2021  PCP: Susy Frizzle, MD REFERRING PROVIDER: Glennon Mac, DO   END OF SESSION:   PT End of Session - 09/19/21 1142     Visit Number 5    Number of Visits 6    Date for PT Re-Evaluation 10/03/21    Authorization Type BC[employer]    PT Start Time 4656    PT Stop Time 1226    PT Time Calculation (min) 41 min    Activity Tolerance Patient tolerated treatment well    Behavior During Therapy [facility] for tasks assessed/performed               Past Medical History:  Diagnosis Date   Acute appendicitis 02/08/2013   ADHD (attention deficit hyperactivity disorder)    Allergic rhinitis    Appendicitis, acute 02/08/2013   Depression    since 8th grade, hospitalized 2015 ([place])   HSV infection    Past Surgical History:  Procedure Laterality Date   APPENDECTOMY     2013   LAPAROSCOPIC APPENDECTOMY N/A 02/08/2013   Procedure: APPENDECTOMY LAPAROSCOPIC;  Surgeon: Earnstine Regal, MD;  Location: [facility];  Service: General;  Laterality: N/A;   WISDOM TOOTH EXTRACTION     Patient Active Problem List   Diagnosis Date Noted   Spinal stenosis of lumbar region 01/03/2021   Somatic dysfunction of spine, lumbar 08/10/2020   Chronic left-sided low back pain with left-sided sciatica 12/01/2019   Genital herpes simplex 09/24/2018   Overweight (BMI 25.0-29.9) 09/24/2018   Bipolar affective disorder, current episode depressed ([place]) 09/24/2018   Generalized anxiety disorder 09/24/2018   Social anxiety disorder 09/24/2018   Insomnia 09/24/2018   Recurrent major depression resistant to treatment ([place]) 09/24/2018   ADD (attention deficit disorder) without hyperactivity 09/24/2018   Elevated liver enzymes 09/24/2018   Drug-induced erectile dysfunction 09/24/2018   ATTENTION DEFICIT HYPERACTIVITY DISORDER 03/04/2007   Seasonal and perennial  allergic rhinitis 03/04/2007    REFERRING DIAG: Chronic right shoulder pain, Right rotator cuff tendonitis    THERAPY DIAG:  Chronic right shoulder pain  Muscle weakness (generalized)  Rationale for Evaluation and Treatment Rehabilitation  PERTINENT HISTORY: None   PRECAUTIONS: None    SUBJECTIVE: Patient reports the shoulder pain seems to move location without known cause, but overall the pain is improving.   PAIN:  Are you having pain? Yes:  NPRS scale: 3/10 Pain location: Right posterior shoulder Pain description: burning, ice pick, electricity  Aggravating factors: Overuse Relieving factors: Rest   OBJECTIVE: (objective measures completed at initial evaluation unless otherwise dated) PATIENT SURVEYS:  FOTO 68% functional status   POSTURE: Slight rounded shoulder and forward head posture   CERVICAL AROM: WNL and pain free 09/19/21  UPPER EXTREMITY ROM:    Active ROM Right eval Left eval Right 08/30/21  Shoulder flexion 160 -   Shoulder abduction 165 -   Shoulder internal rotation T8 - T8 pain  Shoulder external rotation 55 (T4) -   Patient reports pain with IR behind back   UPPER EXTREMITY MMT:   MMT Right eval Left eval  Shoulder flexion 5 5  Shoulder extension 5 5  Shoulder abduction 5 5  Shoulder internal rotation 5 5  Shoulder external rotation 4+ 5  Middle trapezius 4 4  Lower trapezius 4- 4  Patient reports pain with ER MMT on right   SHOULDER SPECIAL TESTS: Impingement tests: Hawkins/Kennedy  impingement test: positive  09/19/21: (+) Spurling's    JOINT MOBILITY TESTING:  WFL  Rt UPAs C5-7 referred pain to Rt shoulder, hypomobility    PALPATION:  Tender to palpation with palpable trigger point right infraspinatus               TODAY'S TREATMENT:  [facility] Adult PT Treatment:                                                DATE: 08/2621 Therapeutic Exercise: Repeated cervical retraction 1 x 10 in sitting and supine Repeated cervical  retraction with extension 1 x 10 in sitting and supine Prone T 2 x 15; 3# Prone W 2 x 15 Prone Y 2 x 10  Serratus wall slides 2 x 15  Manual Therapy: STM cervical paraspinals CPAs/Rt UPAs cervical spine  Manual cervical distraction  Trigger Point Dry Needling Treatment: Pre-treatment instruction: Patient instructed on dry needling rationale, procedures, and possible side effects including pain during treatment (achy,cramping feeling), bruising, drop of blood, lightheadedness, nausea, sweating. Patient Consent Given: Yes Education handout provided: Previously provided Muscles treated: Rt C6 and C7 multifidi   Treatment response/outcome:  referred pain to the shoulder during intervention, no referral post intervention with UPA/CPA Post-treatment instructions: Patient instructed to expect possible mild to moderate muscle soreness later today and/or tomorrow. Patient instructed in methods to reduce muscle soreness and to continue prescribed HEP. If patient was dry needled over the lung field, patient was instructed on signs and symptoms of pneumothorax and, however unlikely, to see immediate medical attention should they occur. Patient was also educated on signs and symptoms of infection and to seek medical attention should they occur. Patient verbalized understanding of these instructions and education.  [facility] Adult PT Treatment:                                                DATE: 09/12/21 Therapeutic Exercise: UBE L5 x 4 min (2 fwd/bwd) while taking subjective Machine low row 55# 3 x 15 Prone ER with 3# x 15, 5# 2 x 15 (right only) Scapular protraction/retraction in bear crawl position 3 x 10 Standing band ER/IR with blue 2 x 15 each Review of updated HEP Manual Therapy: Skilled palpation and monitoring of muscle tension while performing TPDN treatment STM for right posterior cuff Scap pinned GHJ stretching for elevation and cross body posterior cuff Trigger Point Dry Needling  Treatment: Pre-treatment instruction: Patient instructed on dry needling rationale, procedures, and possible side effects including pain during treatment (achy,cramping feeling), bruising, drop of blood, lightheadedness, nausea, sweating. Patient Consent Given: Yes Education handout provided: Previously provided Muscles treated: right infraspinatus, teres minor/major, upper trap, posterior deltoid Needle size and number: .30x53m Electrical stimulation performed: No Parameters: N/A Treatment response/outcome: Twitch response elicited, Palpable decrease in muscle tension, and patient report of improved symptoms Post-treatment instructions: Patient instructed to expect possible mild to moderate muscle soreness later today and/or tomorrow. Patient instructed in methods to reduce muscle soreness and to continue prescribed HEP. If patient was dry needled over the lung field, patient was instructed on signs and symptoms of pneumothorax and, however unlikely, to see immediate medical attention should they occur. Patient was also educated on signs and  symptoms of infection and to seek medical attention should they occur. Patient verbalized understanding of these instructions and education.   [facility] Adult PT Treatment:                                                DATE: 09/05/21 Therapeutic Exercise: Prone I, T, Y, ER with 3# 2 x 10 each (right only) Row with black 2 x 15 ER with green 2 x 15 (right only) Push-up plus from table 2 x 10 Manual Therapy: Skilled palpation and monitoring of muscle tension while performing TPDN treatment Scap pinned GHJ stretching for elevation and cross body posterior cuff Trigger Point Dry Needling Treatment: Pre-treatment instruction: Patient instructed on dry needling rationale, procedures, and possible side effects including pain during treatment (achy,cramping feeling), bruising, drop of blood, lightheadedness, nausea, sweating. Patient Consent Given: Yes Education  handout provided: Previously provided Muscles treated: right teres minor/major, middle and posterior deltoid Needle size and number: .30x49m Electrical stimulation performed: No Parameters: N/A Treatment response/outcome: Twitch response elicited, Palpable decrease in muscle tension, and patient report of improved symptoms Post-treatment instructions: Patient instructed to expect possible mild to moderate muscle soreness later today and/or tomorrow. Patient instructed in methods to reduce muscle soreness and to continue prescribed HEP. If patient was dry needled over the lung field, patient was instructed on signs and symptoms of pneumothorax and, however unlikely, to see immediate medical attention should they occur. Patient was also educated on signs and symptoms of infection and to seek medical attention should they occur. Patient verbalized understanding of these instructions and education.  PATIENT EDUCATION: Education details: HEP update, TPDN Person educated: Patient Education method: Explanation, Demonstration, Tactile cues, Verbal cues, Handouts Education comprehension: verbalized understanding, returned demonstration, verbal cues required, tactile cues required, and needs further education   HOME EXERCISE PROGRAM: Access Code: [email or web address]     ASSESSMENT: CLINICAL IMPRESSION: Given patient's subjective description of his shoulder pain, brief assessment of the cervical spine was completed. He is noted to have a positive Spurling's reporting referral down the entire RUE to the thumb. He is noted to have referred pain to the posterior shoulder with Rt UPAs at C6-7. Trialed repeated cervical movement, but this did not have any effect on his pain. TPDN was performed to C6-7 multifidi with referral noted to the Rt posterior shoulder. Continued with periscapular strengthening without reports of increased pain and minimal postural cues required.     OBJECTIVE IMPAIRMENTS decreased strength,  postural dysfunction, and pain.    ACTIVITY LIMITATIONS lifting   PARTICIPATION LIMITATIONS: cleaning, occupation, and yard work   PERSONAL FACTORS Time since onset of injury/illness/exacerbation are also affecting patient's functional outcome.      GOALS: Goals reviewed with patient? Yes   SHORT TERM GOALS: Target date: 09/12/2021    Patient will be I with initial HEP in order to progress with therapy. Baseline: HEP provided at eval 09/12/2021: independent with initial HEP Goal status: MET   2.  PT will review FOTO with patient by 3rd visit in order to understand expected progress and outcome with therapy. Baseline: FOTO assessed at eval 09/12/2021: reviewed Goal status: MET   3.  Patient will report pain no more than 5/10 with activity in order to reduce functional limitations and indicate improved activity tolerance. Baseline: 7-8/10 09/12/2021: reports < 5/10 pain Goal status: MET   LONG TERM  GOALS: Target date: 10/03/2021     Patient will be I with final HEP to maintain progress from PT. Baseline: HEP provided at eval Goal status: INITIAL   2.  Patient will report >/= 77% status on FOTO to indicate improved functional ability. Baseline: 68% Goal status: INITIAL   3.  Patent will exhibit right rotator cuff strength 5/5 and periscapular strength >/= 4/5 MMT in order to improve lifting ability with the right arm Baseline: right rotator cuff 4+/5, periscapular 4-/5 MMT Goal status: INITIAL   4.  Patient will report no pain or limitation with lifting using the right arm or with reaching behind back. Baseline: patient reports increased right shoulder with lifting and reaching behind back Goal status: INITIAL     PLAN: PT FREQUENCY: 1x/week   PT DURATION: 6 weeks   PLANNED INTERVENTIONS: Therapeutic exercises, Therapeutic activity, Neuromuscular re-education, Balance training, Gait training, Patient/Family education, Joint manipulation, Joint mobilization, Aquatic  Therapy, Dry Needling, Electrical stimulation, Spinal manipulation, Spinal mobilization, Cryotherapy, Moist heat, Taping, Ionotophoresis 66m/ml Dexamethasone, Manual therapy, and Re-evaluation   PLAN FOR NEXT SESSION: Review HEP and progress PRN, dry needling/manual for right shoulder rotator cuff, progress rotator cuff and postural strengthening, endurance for rotator cuff  SGwendolyn Grant PT, DPT, ATC 09/19/21 12:28 PM

## 2021-09-24 DIAGNOSIS — F900 Attention-deficit hyperactivity disorder, predominantly inattentive type: Secondary | ICD-10-CM | POA: Diagnosis not present

## 2021-09-24 DIAGNOSIS — F3341 Major depressive disorder, recurrent, in partial remission: Secondary | ICD-10-CM | POA: Diagnosis not present

## 2021-09-24 DIAGNOSIS — F411 Generalized anxiety disorder: Secondary | ICD-10-CM | POA: Diagnosis not present

## 2021-09-25 NOTE — Therapy (Signed)
OUTPATIENT PHYSICAL THERAPY TREATMENT NOTE   Patient Name: ISAM UNREIN MRN: [id number] DOB:Feb 24, 1986, 36 y.o., male Today's Date: 09/26/2021  PCP: Susy Frizzle, MD REFERRING PROVIDER: Glennon Mac, DO   END OF SESSION:   PT End of Session - 09/26/21 1004     Visit Number 6    Number of Visits 9    Date for PT Re-Evaluation 11/07/21    Authorization Type [employer]    PT Start Time 1000    PT Stop Time 9233    PT Time Calculation (min) 45 min    Activity Tolerance Patient tolerated treatment well    Behavior During Therapy [facility] for tasks assessed/performed                Past Medical History:  Diagnosis Date   Acute appendicitis 02/08/2013   ADHD (attention deficit hyperactivity disorder)    Allergic rhinitis    Appendicitis, acute 02/08/2013   Depression    since 8th grade, hospitalized 2015 ([place])   HSV infection    Past Surgical History:  Procedure Laterality Date   APPENDECTOMY     2013   LAPAROSCOPIC APPENDECTOMY N/A 02/08/2013   Procedure: APPENDECTOMY LAPAROSCOPIC;  Surgeon: Earnstine Regal, MD;  Location: [facility];  Service: General;  Laterality: N/A;   WISDOM TOOTH EXTRACTION     Patient Active Problem List   Diagnosis Date Noted   Spinal stenosis of lumbar region 01/03/2021   Somatic dysfunction of spine, lumbar 08/10/2020   Chronic left-sided low back pain with left-sided sciatica 12/01/2019   Genital herpes simplex 09/24/2018   Overweight (BMI 25.0-29.9) 09/24/2018   Bipolar affective disorder, current episode depressed ([place]) 09/24/2018   Generalized anxiety disorder 09/24/2018   Social anxiety disorder 09/24/2018   Insomnia 09/24/2018   Recurrent major depression resistant to treatment ([place]) 09/24/2018   ADD (attention deficit disorder) without hyperactivity 09/24/2018   Elevated liver enzymes 09/24/2018   Drug-induced erectile dysfunction 09/24/2018   ATTENTION DEFICIT HYPERACTIVITY DISORDER 03/04/2007   Seasonal and perennial  allergic rhinitis 03/04/2007    REFERRING DIAG: Chronic right shoulder pain, Right rotator cuff tendonitis    THERAPY DIAG:  Chronic right shoulder pain  Muscle weakness (generalized)  Rationale for Evaluation and Treatment Rehabilitation  PERTINENT HISTORY: None   PRECAUTIONS: None    SUBJECTIVE: Patient reports his shoulder is feeling better but still having one spot of pain in the back. Overall his shoulder feels better and he feels he can maintain posture better.  PAIN:  Are you having pain? Yes:  NPRS scale: 3/10 Pain location: Right posterior shoulder Pain description: burning, ice pick, electricity  Aggravating factors: Overuse Relieving factors: Rest   OBJECTIVE: (objective measures completed at initial evaluation unless otherwise dated) PATIENT SURVEYS:  FOTO 68% functional status  09/26/2021: 81% functional status (met goal)   POSTURE: Slight rounded shoulder and forward head posture   CERVICAL AROM: WNL and pain free - 09/19/21  UPPER EXTREMITY ROM:    Active ROM Right eval Left eval Right 08/30/21 Right 09/26/2021  Shoulder flexion 160 -    Shoulder abduction 165 -    Shoulder internal rotation T8 - T8 pain T8  Shoulder external rotation 55 (T4) -    Patient reports pain with IR behind back   UPPER EXTREMITY MMT:   MMT Right eval Left eval Right 09/26/2021  Shoulder flexion 5 5   Shoulder extension 5 5   Shoulder abduction 5 5   Shoulder internal rotation 5 5  Shoulder external rotation 4+ 5 5  Middle trapezius 4 4   Lower trapezius 4- 4 4-  Patient reports pain with ER MMT on right   SHOULDER SPECIAL TESTS: Impingement tests: Hawkins/Kennedy impingement test: positive  09/19/21: (+) Spurling's    JOINT MOBILITY TESTING:  WFL   PALPATION:  Tender to palpation with palpable trigger point right infraspinatus and posterior deltoid               TODAY'S TREATMENT:  [facility] Adult PT Treatment:                                                 DATE: 09/26/2021 Therapeutic Exercise: UBE L5 x 4 min (2 fwd/bwd) while taking subjective Seated upper trap and levator scap stretch 2 x 20 sec each Sidelying sleeper and cross body stretch 2 x 20 sec each Prone I palm down with 4# 2 x 10 Prone T palm down with 4# 2 x 10 Prone Y thumb up with 4# 2 x 10 Serratus press from low table 2 x 10 Serratus wall slide with pillow case 2 x 10 Seated chin tuck 2 x 10 Wall ball circles with black physioball cw/ccw 2 x 20 each Manual Therapy: Skilled palpation and monitoring of muscle tension while performing  STM/TPR right posterior shoulder and neck Trigger Point Dry Needling Treatment: Pre-treatment instruction: Patient instructed on dry needling rationale, procedures, and possible side effects including pain during treatment (achy,cramping feeling), bruising, drop of blood, lightheadedness, nausea, sweating. Patient Consent Given: Yes Education handout provided: Previously provided Muscles treated: Right posterior deltoid and infraspinatus, right cervical multifidi Treatment response/outcome: Twitch response and reduce muscle tension Post-treatment instructions: Patient instructed to expect possible mild to moderate muscle soreness later today and/or tomorrow. Patient instructed in methods to reduce muscle soreness and to continue prescribed HEP. If patient was dry needled over the lung field, patient was instructed on signs and symptoms of pneumothorax and, however unlikely, to see immediate medical attention should they occur. Patient was also educated on signs and symptoms of infection and to seek medical attention should they occur. Patient verbalized understanding of these instructions and education.   [facility] Adult PT Treatment:                                                DATE: 08/2621 Therapeutic Exercise: Repeated cervical retraction 1 x 10 in sitting and supine Repeated cervical retraction with extension 1 x 10 in sitting and supine Prone T 2  x 15; 3# Prone W 2 x 15 Prone Y 2 x 10  Serratus wall slides 2 x 15  Manual Therapy: STM cervical paraspinals CPAs/Rt UPAs cervical spine  Manual cervical distraction  Trigger Point Dry Needling Treatment: Pre-treatment instruction: Patient instructed on dry needling rationale, procedures, and possible side effects including pain during treatment (achy,cramping feeling), bruising, drop of blood, lightheadedness, nausea, sweating. Patient Consent Given: Yes Education handout provided: Previously provided Muscles treated: Rt C6 and C7 multifidi   Treatment response/outcome:  referred pain to the shoulder during intervention, no referral post intervention with UPA/CPA Post-treatment instructions: Patient instructed to expect possible mild to moderate muscle soreness later today and/or tomorrow. Patient instructed in methods to reduce muscle soreness  and to continue prescribed HEP. If patient was dry needled over the lung field, patient was instructed on signs and symptoms of pneumothorax and, however unlikely, to see immediate medical attention should they occur. Patient was also educated on signs and symptoms of infection and to seek medical attention should they occur. Patient verbalized understanding of these instructions and education.  [facility] Adult PT Treatment:                                                DATE: 09/12/21 Therapeutic Exercise: UBE L5 x 4 min (2 fwd/bwd) while taking subjective Machine low row 55# 3 x 15 Prone ER with 3# x 15, 5# 2 x 15 (right only) Scapular protraction/retraction in bear crawl position 3 x 10 Standing band ER/IR with blue 2 x 15 each Review of updated HEP Manual Therapy: Skilled palpation and monitoring of muscle tension while performing TPDN treatment STM for right posterior cuff Scap pinned GHJ stretching for elevation and cross body posterior cuff Trigger Point Dry Needling Treatment: Pre-treatment instruction: Patient instructed on dry needling  rationale, procedures, and possible side effects including pain during treatment (achy,cramping feeling), bruising, drop of blood, lightheadedness, nausea, sweating. Patient Consent Given: Yes Education handout provided: Previously provided Muscles treated: right infraspinatus, teres minor/major, upper trap, posterior deltoid Needle size and number: .30x33m Electrical stimulation performed: No Parameters: N/A Treatment response/outcome: Twitch response elicited, Palpable decrease in muscle tension, and patient report of improved symptoms Post-treatment instructions: Patient instructed to expect possible mild to moderate muscle soreness later today and/or tomorrow. Patient instructed in methods to reduce muscle soreness and to continue prescribed HEP. If patient was dry needled over the lung field, patient was instructed on signs and symptoms of pneumothorax and, however unlikely, to see immediate medical attention should they occur. Patient was also educated on signs and symptoms of infection and to seek medical attention should they occur. Patient verbalized understanding of these instructions and education.  PATIENT EDUCATION: Education details: POC extension, HEP, TPDN Person educated: Patient Education method: Explanation, Demonstration, Tactile cues, Verbal cues Education comprehension: Verbalized understanding, returned demonstration, verbal cues required, tactile cues required, and needs further education   HOME EXERCISE PROGRAM: Access Code: [email or web address]     ASSESSMENT: CLINICAL IMPRESSION: Patient tolerated therapy well with no adverse effects. He reports improvement in his functional status, achieving his FOTO goal, and improved rotator cuff strength but continues to exhibit gross periscapular strength deficit and pain reaching behind his back. He responds well to the dry needling and this visit did not report any cervical referral with CPA/UPA or palpation. Therapy continues to focus on  progressing his strength and control and patient does report improvement in his ability to maintain a better posture. Patient would benefit from continued skilled PT to progress his mobility and strength in order to reduce pain and maximize his functional ability, so will extend PT POC for 6 more weeks at frequency of every other week.    OBJECTIVE IMPAIRMENTS decreased strength, postural dysfunction, and pain.    ACTIVITY LIMITATIONS lifting   PARTICIPATION LIMITATIONS: cleaning, occupation, and yard work   PERSONAL FACTORS Time since onset of injury/illness/exacerbation are also affecting patient's functional outcome.      GOALS: Goals reviewed with patient? Yes   SHORT TERM GOALS: Target date: 09/12/2021    Patient will be I with initial HEP in  order to progress with therapy. Baseline: HEP provided at eval 09/12/2021: independent with initial HEP Goal status: MET   2.  PT will review FOTO with patient by 3rd visit in order to understand expected progress and outcome with therapy. Baseline: FOTO assessed at eval 09/12/2021: reviewed Goal status: MET   3.  Patient will report pain no more than 5/10 with activity in order to reduce functional limitations and indicate improved activity tolerance. Baseline: 7-8/10 09/12/2021: reports < 5/10 pain Goal status: MET   LONG TERM GOALS: Target date: 11/07/2021     Patient will be I with final HEP to maintain progress from PT. Baseline: HEP provided at eval 09/26/2021: progressing Goal status: PARTIALLY MET   2.  Patient will report >/= 77% status on FOTO to indicate improved functional ability. Baseline: 68% 09/26/2021: 81% Goal status: MET   3.  Patent will exhibit right rotator cuff strength 5/5 and periscapular strength >/= 4/5 MMT in order to improve lifting ability with the right arm Baseline: right rotator cuff 4+/5, periscapular 4-/5 MMT 09/26/2021: periscapular strength 4-/5 MMT Goal status: PARTIALLY MET   4.  Patient will  report no pain or limitation with lifting using the right arm or with reaching behind back. Baseline: patient reports increased right shoulder with lifting and reaching behind back 09/26/2021: continues to have pain reaching behind back Goal status: PARTIALLY MET     PLAN: PT FREQUENCY: Biweekly   PT DURATION: 6 weeks   PLANNED INTERVENTIONS: Therapeutic exercises, Therapeutic activity, Neuromuscular re-education, Balance training, Gait training, Patient/Family education, Joint manipulation, Joint mobilization, Aquatic Therapy, Dry Needling, Electrical stimulation, Spinal manipulation, Spinal mobilization, Cryotherapy, Moist heat, Taping, Ionotophoresis 75m/ml Dexamethasone, Manual therapy, and Re-evaluation   PLAN FOR NEXT SESSION: Review HEP and progress PRN, dry needling/manual for right shoulder and neck, progress rotator cuff and postural strengthening, endurance for rotator cuff   CHilda Blades PT, DPT, LAT, ATC 09/26/21  11:22 AM Phone: [phone]: [phone]

## 2021-09-26 ENCOUNTER — Ambulatory Visit: Payer: BC Managed Care – PPO | Attending: Sports Medicine | Admitting: Physical Therapy

## 2021-09-26 ENCOUNTER — Other Ambulatory Visit: Payer: Self-pay

## 2021-09-26 ENCOUNTER — Encounter: Payer: Self-pay | Admitting: Physical Therapy

## 2021-09-26 DIAGNOSIS — G8929 Other chronic pain: Secondary | ICD-10-CM | POA: Diagnosis not present

## 2021-09-26 DIAGNOSIS — E559 Vitamin D deficiency, unspecified: Secondary | ICD-10-CM | POA: Diagnosis not present

## 2021-09-26 DIAGNOSIS — M25511 Pain in right shoulder: Secondary | ICD-10-CM | POA: Insufficient documentation

## 2021-09-26 DIAGNOSIS — Z79899 Other long term (current) drug therapy: Secondary | ICD-10-CM | POA: Diagnosis not present

## 2021-09-26 DIAGNOSIS — E785 Hyperlipidemia, unspecified: Secondary | ICD-10-CM | POA: Diagnosis not present

## 2021-09-26 DIAGNOSIS — E291 Testicular hypofunction: Secondary | ICD-10-CM | POA: Diagnosis not present

## 2021-09-26 DIAGNOSIS — M6281 Muscle weakness (generalized): Secondary | ICD-10-CM | POA: Diagnosis not present

## 2021-10-08 ENCOUNTER — Encounter: Payer: Self-pay | Admitting: Family Medicine

## 2021-10-10 ENCOUNTER — Ambulatory Visit: Payer: BC Managed Care – PPO

## 2021-10-10 DIAGNOSIS — M6281 Muscle weakness (generalized): Secondary | ICD-10-CM | POA: Diagnosis not present

## 2021-10-10 DIAGNOSIS — G8929 Other chronic pain: Secondary | ICD-10-CM | POA: Diagnosis not present

## 2021-10-10 DIAGNOSIS — M25511 Pain in right shoulder: Secondary | ICD-10-CM | POA: Diagnosis not present

## 2021-10-10 NOTE — Therapy (Signed)
OUTPATIENT PHYSICAL THERAPY TREATMENT NOTE   Patient Name: Terry [name] MRN: [id number] DOB:[birth year], 36 y.o., male Today's Date: 10/10/2021  PCP: Susy Frizzle, MD REFERRING PROVIDER: Glennon Mac, DO   END OF SESSION:   PT End of Session - 10/10/21 0843     Visit Number 7    Number of Visits 9    Date for PT Re-Evaluation 11/07/21    Authorization Type [employer]    PT Start Time 0845    PT Stop Time 0929    PT Time Calculation (min) 44 min    Activity Tolerance Patient tolerated treatment well    Behavior During Therapy [facility] for tasks assessed/performed                 Past Medical History:  Diagnosis Date   Acute appendicitis 02/08/2013   ADHD (attention deficit hyperactivity disorder)    Allergic rhinitis    Appendicitis, acute 02/08/2013   Depression    since 8th grade, hospitalized 2015 ([place])   HSV infection    Past Surgical History:  Procedure Laterality Date   APPENDECTOMY     2013   LAPAROSCOPIC APPENDECTOMY N/A 02/08/2013   Procedure: APPENDECTOMY LAPAROSCOPIC;  Surgeon: Earnstine Regal, MD;  Location: [facility];  Service: General;  Laterality: N/A;   WISDOM TOOTH EXTRACTION     Patient Active Problem List   Diagnosis Date Noted   Spinal stenosis of lumbar region 01/03/2021   Somatic dysfunction of spine, lumbar 08/10/2020   Chronic left-sided low back pain with left-sided sciatica 12/01/2019   Genital herpes simplex 09/24/2018   Overweight (BMI 25.0-29.9) 09/24/2018   Bipolar affective disorder, current episode depressed ([place]) 09/24/2018   Generalized anxiety disorder 09/24/2018   Social anxiety disorder 09/24/2018   Insomnia 09/24/2018   Recurrent major depression resistant to treatment ([place]) 09/24/2018   ADD (attention deficit disorder) without hyperactivity 09/24/2018   Elevated liver enzymes 09/24/2018   Drug-induced erectile dysfunction 09/24/2018   ATTENTION DEFICIT HYPERACTIVITY DISORDER 03/04/2007   Seasonal and perennial  allergic rhinitis 03/04/2007    REFERRING DIAG: Chronic right shoulder pain, Right rotator cuff tendonitis    THERAPY DIAG:  Chronic right shoulder pain  Muscle weakness (generalized)  Rationale for Evaluation and Treatment Rehabilitation  PERTINENT HISTORY: None   PRECAUTIONS: None    SUBJECTIVE: Patient reports the shoulder is mostly better, but did some heavy lifting over the weekend which bothered it.  PAIN:  Are you having pain? Yes:  NPRS scale: 4/10 Pain location: Right posterior shoulder Pain description: burning Aggravating factors: Overuse Relieving factors: Rest   OBJECTIVE: (objective measures completed at initial evaluation unless otherwise dated) PATIENT SURVEYS:  FOTO 68% functional status  09/26/2021: 81% functional status (met goal)   POSTURE: Slight rounded shoulder and forward head posture   CERVICAL AROM: WNL and pain free - 09/19/21  UPPER EXTREMITY ROM:    Active ROM Right eval Left eval Right 08/30/21 Right 09/26/2021  Shoulder flexion 160 -    Shoulder abduction 165 -    Shoulder internal rotation T8 - T8 pain T8  Shoulder external rotation 55 (T4) -    Patient reports pain with IR behind back   UPPER EXTREMITY MMT:   MMT Right eval Left eval Right 09/26/2021  Shoulder flexion 5 5   Shoulder extension 5 5   Shoulder abduction 5 5   Shoulder internal rotation 5 5   Shoulder external rotation 4+ 5 5  Middle trapezius 4 4   Lower trapezius  4- 4 4-  Patient reports pain with ER MMT on right   SHOULDER SPECIAL TESTS: Impingement tests: Hawkins/Kennedy impingement test: positive  09/19/21: (+) Spurling's    JOINT MOBILITY TESTING:  WFL   PALPATION:  Tender to palpation with palpable trigger point right infraspinatus and posterior deltoid               TODAY'S TREATMENT:  [facility] Adult PT Treatment:                                                DATE: 10/10/21 Therapeutic Exercise: UBE level 3.5 x 2 min each fwd/bwd Sleeper stretch 2  x 30 sec Cross body stretch 2 x 30 sec Childs pose x 60 sec  Prone retraction with ER 2 x 10 bilateral  Scapular wall clocks with red ball 2 x 5 each with therapist assist for scapular rotation on the RUE Resisted IR at 90/90 green band 2 x 10  Manual Therapy: Skilled palpation and monitoring of muscle tension while performing  STM/TPR right rotator cuff Scapular mobilizations inferior/superior, and upward/downward rotation Trigger Point Dry Needling Treatment: Pre-treatment instruction: Patient instructed on dry needling rationale, procedures, and possible side effects including pain during treatment (achy,cramping feeling), bruising, drop of blood, lightheadedness, nausea, sweating. Patient Consent Given: Yes Education handout provided: Previously provided Muscles treated: Right infraspinatus, subscapularis  Treatment response/outcome: Twitch response and reduce muscle tension Post-treatment instructions: Patient instructed to expect possible mild to moderate muscle soreness later today and/or tomorrow. Patient instructed in methods to reduce muscle soreness and to continue prescribed HEP. If patient was dry needled over the lung field, patient was instructed on signs and symptoms of pneumothorax and, however unlikely, to see immediate medical attention should they occur. Patient was also educated on signs and symptoms of infection and to seek medical attention should they occur. Patient verbalized understanding of these instructions and education.  [facility] Adult PT Treatment:                                                DATE: 09/26/2021 Therapeutic Exercise: UBE L5 x 4 min (2 fwd/bwd) while taking subjective Seated upper trap and levator scap stretch 2 x 20 sec each Sidelying sleeper and cross body stretch 2 x 20 sec each Prone I palm down with 4# 2 x 10 Prone T palm down with 4# 2 x 10 Prone Y thumb up with 4# 2 x 10 Serratus press from low table 2 x 10 Serratus wall slide with pillow  case 2 x 10 Seated chin tuck 2 x 10 Wall ball circles with black physioball cw/ccw 2 x 20 each Manual Therapy: Skilled palpation and monitoring of muscle tension while performing  STM/TPR right posterior shoulder and neck Trigger Point Dry Needling Treatment: Pre-treatment instruction: Patient instructed on dry needling rationale, procedures, and possible side effects including pain during treatment (achy,cramping feeling), bruising, drop of blood, lightheadedness, nausea, sweating. Patient Consent Given: Yes Education handout provided: Previously provided Muscles treated: Right posterior deltoid and infraspinatus, right cervical multifidi Treatment response/outcome: Twitch response and reduce muscle tension Post-treatment instructions: Patient instructed to expect possible mild to moderate muscle soreness later today and/or tomorrow. Patient instructed in methods to reduce muscle soreness  and to continue prescribed HEP. If patient was dry needled over the lung field, patient was instructed on signs and symptoms of pneumothorax and, however unlikely, to see immediate medical attention should they occur. Patient was also educated on signs and symptoms of infection and to seek medical attention should they occur. Patient verbalized understanding of these instructions and education.   [facility] Adult PT Treatment:                                                DATE: 08/2621 Therapeutic Exercise: Repeated cervical retraction 1 x 10 in sitting and supine Repeated cervical retraction with extension 1 x 10 in sitting and supine Prone T 2 x 15; 3# Prone W 2 x 15 Prone Y 2 x 10  Serratus wall slides 2 x 15  Manual Therapy: STM cervical paraspinals CPAs/Rt UPAs cervical spine  Manual cervical distraction  Trigger Point Dry Needling Treatment: Pre-treatment instruction: Patient instructed on dry needling rationale, procedures, and possible side effects including pain during treatment (achy,cramping  feeling), bruising, drop of blood, lightheadedness, nausea, sweating. Patient Consent Given: Yes Education handout provided: Previously provided Muscles treated: Rt C6 and C7 multifidi   Treatment response/outcome:  referred pain to the shoulder during intervention, no referral post intervention with UPA/CPA Post-treatment instructions: Patient instructed to expect possible mild to moderate muscle soreness later today and/or tomorrow. Patient instructed in methods to reduce muscle soreness and to continue prescribed HEP. If patient was dry needled over the lung field, patient was instructed on signs and symptoms of pneumothorax and, however unlikely, to see immediate medical attention should they occur. Patient was also educated on signs and symptoms of infection and to seek medical attention should they occur. Patient verbalized understanding of these instructions and education.   PATIENT EDUCATION: Education details: N/A Person educated: N/A Education method: N/A Education comprehension: N/A   HOME EXERCISE PROGRAM: Access Code: [email or web address]     ASSESSMENT: CLINICAL IMPRESSION: Patient tolerated session well today with manual therapy/TPDN focused on reducing myofasical restriction about the rotator cuff as well as scapular mobility. Continued with further progression of periscapular and rotator cuff strengthening with patient requiring minimal postural cues throughout session to reduce upper trap activation. He is noted to have decreased upward rotation of the scapula with overhead movement reporting pain about the posterior rotator cuff when performing scapular wall clocks. With therapist assist of scapular rotation with scapular wall clocks this relieved his pain.   OBJECTIVE IMPAIRMENTS decreased strength, postural dysfunction, and pain.    ACTIVITY LIMITATIONS lifting   PARTICIPATION LIMITATIONS: cleaning, occupation, and yard work   PERSONAL FACTORS Time since onset of  injury/illness/exacerbation are also affecting patient's functional outcome.      GOALS: Goals reviewed with patient? Yes   SHORT TERM GOALS: Target date: 09/12/2021    Patient will be I with initial HEP in order to progress with therapy. Baseline: HEP provided at eval 09/12/2021: independent with initial HEP Goal status: MET   2.  PT will review FOTO with patient by 3rd visit in order to understand expected progress and outcome with therapy. Baseline: FOTO assessed at eval 09/12/2021: reviewed Goal status: MET   3.  Patient will report pain no more than 5/10 with activity in order to reduce functional limitations and indicate improved activity tolerance. Baseline: 7-8/10 09/12/2021: reports < 5/10 pain Goal status: MET  LONG TERM GOALS: Target date: 11/07/2021     Patient will be I with final HEP to maintain progress from PT. Baseline: HEP provided at eval 09/26/2021: progressing Goal status: PARTIALLY MET   2.  Patient will report >/= 77% status on FOTO to indicate improved functional ability. Baseline: 68% 09/26/2021: 81% Goal status: MET   3.  Patent will exhibit right rotator cuff strength 5/5 and periscapular strength >/= 4/5 MMT in order to improve lifting ability with the right arm Baseline: right rotator cuff 4+/5, periscapular 4-/5 MMT 09/26/2021: periscapular strength 4-/5 MMT Goal status: PARTIALLY MET   4.  Patient will report no pain or limitation with lifting using the right arm or with reaching behind back. Baseline: patient reports increased right shoulder with lifting and reaching behind back 09/26/2021: continues to have pain reaching behind back Goal status: PARTIALLY MET     PLAN: PT FREQUENCY: Biweekly   PT DURATION: 6 weeks   PLANNED INTERVENTIONS: Therapeutic exercises, Therapeutic activity, Neuromuscular re-education, Balance training, Gait training, Patient/Family education, Joint manipulation, Joint mobilization, Aquatic Therapy, Dry Needling,  Electrical stimulation, Spinal manipulation, Spinal mobilization, Cryotherapy, Moist heat, Taping, Ionotophoresis 69m/ml Dexamethasone, Manual therapy, and Re-evaluation   PLAN FOR NEXT SESSION: Review HEP and progress PRN, dry needling/manual for right shoulder and neck, progress rotator cuff and postural strengthening, endurance for rotator cuff  SGwendolyn Grant PT, DPT, ATC 10/10/21 9:29 AM

## 2021-10-15 DIAGNOSIS — H00011 Hordeolum externum right upper eyelid: Secondary | ICD-10-CM | POA: Diagnosis not present

## 2021-10-24 ENCOUNTER — Ambulatory Visit: Payer: BC Managed Care – PPO

## 2021-10-24 DIAGNOSIS — M6281 Muscle weakness (generalized): Secondary | ICD-10-CM | POA: Diagnosis not present

## 2021-10-24 DIAGNOSIS — F332 Major depressive disorder, recurrent severe without psychotic features: Secondary | ICD-10-CM | POA: Diagnosis not present

## 2021-10-24 DIAGNOSIS — M25511 Pain in right shoulder: Secondary | ICD-10-CM | POA: Diagnosis not present

## 2021-10-24 DIAGNOSIS — G8929 Other chronic pain: Secondary | ICD-10-CM | POA: Diagnosis not present

## 2021-10-24 NOTE — Therapy (Signed)
OUTPATIENT PHYSICAL THERAPY TREATMENT NOTE   Patient Name: Terry [name] MRN: [id number] DOB:05/31/1985, 36 y.o., male Today's Date: 10/24/2021  PCP: Pickard, Warren T, MD REFERRING PROVIDER: Jackson, Benjamin, DO   END OF SESSION:   PT End of Session - 10/24/21 1233     Visit Number 8    Number of Visits 9    Date for PT Re-Evaluation 11/07/21    Authorization Type BCBS    PT Start Time 1232    PT Stop Time 1315    PT Time Calculation (min) 43 min    Activity Tolerance Patient tolerated treatment well    Behavior During Therapy WFL for tasks assessed/performed                  Past Medical History:  Diagnosis Date   Acute appendicitis 02/08/2013   ADHD (attention deficit hyperactivity disorder)    Allergic rhinitis    Appendicitis, acute 02/08/2013   Depression    since 8th grade, hospitalized 2015 ([place])   HSV infection    Past Surgical History:  Procedure Laterality Date   APPENDECTOMY     2013   LAPAROSCOPIC APPENDECTOMY N/A 02/08/2013   Procedure: APPENDECTOMY LAPAROSCOPIC;  Surgeon: Todd M Gerkin, MD;  Location: [facility];  Service: General;  Laterality: N/A;   WISDOM TOOTH EXTRACTION     Patient Active Problem List   Diagnosis Date Noted   Spinal stenosis of lumbar region 01/03/2021   Somatic dysfunction of spine, lumbar 08/10/2020   Chronic left-sided low back pain with left-sided sciatica 12/01/2019   Genital herpes simplex 09/24/2018   Overweight (BMI 25.0-29.9) 09/24/2018   Bipolar affective disorder, current episode depressed (HCC) 09/24/2018   Generalized anxiety disorder 09/24/2018   Social anxiety disorder 09/24/2018   Insomnia 09/24/2018   Recurrent major depression resistant to treatment (HCC) 09/24/2018   ADD (attention deficit disorder) without hyperactivity 09/24/2018   Elevated liver enzymes 09/24/2018   Drug-induced erectile dysfunction 09/24/2018   ATTENTION DEFICIT HYPERACTIVITY DISORDER 03/04/2007   Seasonal and perennial  allergic rhinitis 03/04/2007    REFERRING DIAG: Chronic right shoulder pain, Right rotator cuff tendonitis    THERAPY DIAG:  Chronic right shoulder pain  Muscle weakness (generalized)  Rationale for Evaluation and Treatment Rehabilitation  PERTINENT HISTORY: None   PRECAUTIONS: None    SUBJECTIVE: patient reports the shoulder is getting better, but the pain can still fluctuate about the posterolateral shoulder noticing it more with heavy lifting.  PAIN:  Are you having pain? None currently  At worst NPRS scale: 5/10 Pain location: Right posterior shoulder Pain description: dull, burning  Aggravating factors: Overuse Relieving factors: Rest   OBJECTIVE: (objective measures completed at initial evaluation unless otherwise dated) PATIENT SURVEYS:  FOTO 68% functional status  09/26/2021: 81% functional status (met goal)   POSTURE: Slight rounded shoulder and forward head posture   CERVICAL AROM: WNL and pain free - 09/19/21  UPPER EXTREMITY ROM:    Active ROM Right eval Left eval Right 08/30/21 Right 09/26/2021 10/24/21 Right   Shoulder flexion 160 -     Shoulder abduction 165 -     Shoulder internal rotation T8 - T8 pain T8 T8  Shoulder external rotation 55 (T4) -     Patient reports pain with IR behind back   UPPER EXTREMITY MMT:   MMT Right eval Left eval Right 09/26/2021  Shoulder flexion 5 5   Shoulder extension 5 5   Shoulder abduction 5 5   Shoulder internal rotation 5   5   Shoulder external rotation 4+ 5 5  Middle trapezius 4 4   Lower trapezius 4- 4 4-  Patient reports pain with ER MMT on right   SHOULDER SPECIAL TESTS: Impingement tests: Hawkins/Kennedy impingement test: positive  09/19/21: (+) Spurling's    JOINT MOBILITY TESTING:  WFL   PALPATION:  Tender to palpation with palpable trigger point right infraspinatus and posterior deltoid               TODAY'S TREATMENT:  [facility] Adult PT Treatment:                                                 DATE: 10/24/21 Therapeutic Exercise: UBE level 3.5 x 4 min fwd Sleeper stretch 3 x 30 sec  Resisted ER at 90/90 red band 2 x 10  Resisted IR at 90/90 green band 2 x 10  Prone T on stability ball 2 x 15 Prone Y on stability ball 2 x 15  Serratus punch 2 x 15; 10 lbs  Functional IR with strap x 10; 5 sec hold  Updated HEP  Manual therapy: Skilled palpation and monitoring of muscle tension while performing  STM/TPR right rotator cuff Scapular mobilizations inferior/superior, and upward/downward rotation ER/IR stretching Rt shoulder  Active release to Rt lat  Rt GHJ mobilizations grade II-III A/P and inferior Trigger Point Dry Needling Treatment: Pre-treatment instruction: Patient instructed on dry needling rationale, procedures, and possible side effects including pain during treatment (achy,cramping feeling), bruising, drop of blood, lightheadedness, nausea, sweating. Patient Consent Given: Yes Education handout provided: No Muscles treated: Rt infraspinatus and subscapularis   Treatment response/outcome: Twitch response elicited and Palpable decrease in muscle tension Post-treatment instructions: Patient instructed to expect possible mild to moderate muscle soreness later today and/or tomorrow. Patient instructed in methods to reduce muscle soreness and to continue prescribed HEP. If patient was dry needled over the lung field, patient was instructed on signs and symptoms of pneumothorax and, however unlikely, to see immediate medical attention should they occur. Patient was also educated on signs and symptoms of infection and to seek medical attention should they occur. Patient verbalized understanding of these instructions and education.  [facility] Adult PT Treatment:                                                DATE: 10/10/21 Therapeutic Exercise: UBE level 3.5 x 2 min each fwd/bwd Sleeper stretch 2 x 30 sec Cross body stretch 2 x 30 sec Childs pose x 60 sec  Prone retraction with ER 2  x 10 bilateral  Scapular wall clocks with red ball 2 x 5 each with therapist assist for scapular rotation on the RUE Resisted IR at 90/90 green band 2 x 10  Manual Therapy: Skilled palpation and monitoring of muscle tension while performing  STM/TPR right rotator cuff Scapular mobilizations inferior/superior, and upward/downward rotation Trigger Point Dry Needling Treatment: Pre-treatment instruction: Patient instructed on dry needling rationale, procedures, and possible side effects including pain during treatment (achy,cramping feeling), bruising, drop of blood, lightheadedness, nausea, sweating. Patient Consent Given: Yes Education handout provided: Previously provided Muscles treated: Right infraspinatus, subscapularis  Treatment response/outcome: Twitch response and reduce muscle tension Post-treatment instructions: Patient instructed to   expect possible mild to moderate muscle soreness later today and/or tomorrow. Patient instructed in methods to reduce muscle soreness and to continue prescribed HEP. If patient was dry needled over the lung field, patient was instructed on signs and symptoms of pneumothorax and, however unlikely, to see immediate medical attention should they occur. Patient was also educated on signs and symptoms of infection and to seek medical attention should they occur. Patient verbalized understanding of these instructions and education.  [facility] Adult PT Treatment:                                                DATE: 09/26/2021 Therapeutic Exercise: UBE L5 x 4 min (2 fwd/bwd) while taking subjective Seated upper trap and levator scap stretch 2 x 20 sec each Sidelying sleeper and cross body stretch 2 x 20 sec each Prone I palm down with 4# 2 x 10 Prone T palm down with 4# 2 x 10 Prone Y thumb up with 4# 2 x 10 Serratus press from low table 2 x 10 Serratus wall slide with pillow case 2 x 10 Seated chin tuck 2 x 10 Wall ball circles with black physioball cw/ccw 2 x 20  each Manual Therapy: Skilled palpation and monitoring of muscle tension while performing  STM/TPR right posterior shoulder and neck Trigger Point Dry Needling Treatment: Pre-treatment instruction: Patient instructed on dry needling rationale, procedures, and possible side effects including pain during treatment (achy,cramping feeling), bruising, drop of blood, lightheadedness, nausea, sweating. Patient Consent Given: Yes Education handout provided: Previously provided Muscles treated: Right posterior deltoid and infraspinatus, right cervical multifidi Treatment response/outcome: Twitch response and reduce muscle tension Post-treatment instructions: Patient instructed to expect possible mild to moderate muscle soreness later today and/or tomorrow. Patient instructed in methods to reduce muscle soreness and to continue prescribed HEP. If patient was dry needled over the lung field, patient was instructed on signs and symptoms of pneumothorax and, however unlikely, to see immediate medical attention should they occur. Patient was also educated on signs and symptoms of infection and to seek medical attention should they occur. Patient verbalized understanding of these instructions and education.     PATIENT EDUCATION: Education details: HEP Person educated: patient  Education method: instruction, demo Education comprehension: verbalized understanding    HOME EXERCISE PROGRAM: Access Code: [email or web address]     ASSESSMENT: CLINICAL IMPRESSION: Manual therapy focused on improving scapulothoracic and glenohumeral mobility as he is noted to have hypomobility in all planes of the GHJ and restriction into upward rotation of the scapulothoracic joint. Strengthening focused on further rotator cuff and periscapular strengthening, which he tolerated well requiring minimal postural cues to decrease excessive upper trap engagement. IR functional reach remains unchanged, but is no longer painful.   OBJECTIVE  IMPAIRMENTS decreased strength, postural dysfunction, and pain.    ACTIVITY LIMITATIONS lifting   PARTICIPATION LIMITATIONS: cleaning, occupation, and yard work   PERSONAL FACTORS Time since onset of injury/illness/exacerbation are also affecting patient's functional outcome.      GOALS: Goals reviewed with patient? Yes   SHORT TERM GOALS: Target date: 09/12/2021    Patient will be I with initial HEP in order to progress with therapy. Baseline: HEP provided at eval 09/12/2021: independent with initial HEP Goal status: MET   2.  PT will review FOTO with patient by 3rd visit in order to understand expected progress and  outcome with therapy. Baseline: FOTO assessed at eval 09/12/2021: reviewed Goal status: MET   3.  Patient will report pain no more than 5/10 with activity in order to reduce functional limitations and indicate improved activity tolerance. Baseline: 7-8/10 09/12/2021: reports < 5/10 pain Goal status: MET   LONG TERM GOALS: Target date: 11/07/2021     Patient will be I with final HEP to maintain progress from PT. Baseline: HEP provided at eval 09/26/2021: progressing Goal status: PARTIALLY MET   2.  Patient will report >/= 77% status on FOTO to indicate improved functional ability. Baseline: 68% 09/26/2021: 81% Goal status: MET   3.  Patent will exhibit right rotator cuff strength 5/5 and periscapular strength >/= 4/5 MMT in order to improve lifting ability with the right arm Baseline: right rotator cuff 4+/5, periscapular 4-/5 MMT 09/26/2021: periscapular strength 4-/5 MMT Goal status: PARTIALLY MET   4.  Patient will report no pain or limitation with lifting using the right arm or with reaching behind back. Baseline: patient reports increased right shoulder with lifting and reaching behind back 09/26/2021: continues to have pain reaching behind back Goal status: PARTIALLY MET     PLAN: PT FREQUENCY: Biweekly   PT DURATION: 6 weeks   PLANNED INTERVENTIONS:  Therapeutic exercises, Therapeutic activity, Neuromuscular re-education, Balance training, Gait training, Patient/Family education, Joint manipulation, Joint mobilization, Aquatic Therapy, Dry Needling, Electrical stimulation, Spinal manipulation, Spinal mobilization, Cryotherapy, Moist heat, Taping, Ionotophoresis 38m/ml Dexamethasone, Manual therapy, and Re-evaluation   PLAN FOR NEXT SESSION: Review HEP and progress PRN, dry needling/manual for right shoulder and neck, progress rotator cuff and postural strengthening, endurance for rotator cuff; anticipate D/C.   SGwendolyn Grant PT, DPT, ATC 10/24/21 1:18 PM

## 2021-11-06 NOTE — Therapy (Signed)
OUTPATIENT PHYSICAL THERAPY TREATMENT NOTE  DISCHARGE   Patient Name: Terry [name] MRN: [id number] DOB:October 09, 1985, 36 y.o., male Today's Date: 11/07/2021  PCP: Susy Frizzle, MD REFERRING PROVIDER: Glennon Mac, DO   END OF SESSION:   PT End of Session - 11/07/21 0834     Visit Number 9    Number of Visits 9    Date for PT Re-Evaluation 11/07/21    Authorization Type BCBS    PT Start Time 0830    PT Stop Time 0910    PT Time Calculation (min) 40 min    Activity Tolerance Patient tolerated treatment well    Behavior During Therapy [facility] for tasks assessed/performed                   Past Medical History:  Diagnosis Date   Acute appendicitis 02/08/2013   ADHD (attention deficit hyperactivity disorder)    Allergic rhinitis    Appendicitis, acute 02/08/2013   Depression    since 8th grade, hospitalized 2015 ([place])   HSV infection    Past Surgical History:  Procedure Laterality Date   APPENDECTOMY     2013   LAPAROSCOPIC APPENDECTOMY N/A 02/08/2013   Procedure: APPENDECTOMY LAPAROSCOPIC;  Surgeon: Earnstine Regal, MD;  Location: [facility];  Service: General;  Laterality: N/A;   WISDOM TOOTH EXTRACTION     Patient Active Problem List   Diagnosis Date Noted   Spinal stenosis of lumbar region 01/03/2021   Somatic dysfunction of spine, lumbar 08/10/2020   Chronic left-sided low back pain with left-sided sciatica 12/01/2019   Genital herpes simplex 09/24/2018   Overweight (BMI 25.0-29.9) 09/24/2018   Bipolar affective disorder, current episode depressed ([place]) 09/24/2018   Generalized anxiety disorder 09/24/2018   Social anxiety disorder 09/24/2018   Insomnia 09/24/2018   Recurrent major depression resistant to treatment ([place]) 09/24/2018   ADD (attention deficit disorder) without hyperactivity 09/24/2018   Elevated liver enzymes 09/24/2018   Drug-induced erectile dysfunction 09/24/2018   ATTENTION DEFICIT HYPERACTIVITY DISORDER 03/04/2007   Seasonal  and perennial allergic rhinitis 03/04/2007    REFERRING DIAG: Chronic right shoulder pain, Right rotator cuff tendonitis    THERAPY DIAG:  Chronic right shoulder pain  Muscle weakness (generalized)  Rationale for Evaluation and Treatment Rehabilitation  PERTINENT HISTORY: None   PRECAUTIONS: None    SUBJECTIVE: Patient states shoulder has been overall doing a lot better. He did have a slightly different pain in the shoulder yesterday that has since resolved, it came on pretty quick when he was in the shower but dissipated in 5-10 minutes when he stopped moving his shoulder.  PAIN:  Are you having pain? None currently  At worst NPRS scale: 3/10 Pain location: Right posterior shoulder Pain description: dull, burning  Aggravating factors: Overuse Relieving factors: Rest   OBJECTIVE: (objective measures completed at initial evaluation unless otherwise dated) PATIENT SURVEYS:  FOTO 68% functional status  09/26/2021: 81% functional status (met goal)   POSTURE: Slight rounded shoulder and forward head posture   UPPER EXTREMITY ROM:    Active ROM Right eval Left eval Right  08/30/21 Right 09/26/2021 10/24/21 Right   Shoulder flexion 160 -     Shoulder abduction 165 -     Shoulder internal rotation T8 - T8 pain T8 T8  Shoulder external rotation 55 (T4) -     Patient reports pain with IR behind back   UPPER EXTREMITY MMT:   MMT Right eval Left eval Right 09/26/2021 Right 11/06/2021  Shoulder  flexion 5 5    Shoulder extension 5 5    Shoulder abduction 5 5    Shoulder internal rotation 5 5    Shoulder external rotation 4+ _0 Middle trapezius _1 Lower trapezius 4- 4 4- 4  Patient reports pain with ER MMT on right   SHOULDER SPECIAL TESTS: Impingement tests: Hawkins/Kennedy impingement test: positive  09/19/21: (+) Spurling's    JOINT MOBILITY TESTING:  WFL   PALPATION:  Tender to palpation with palpable trigger point right infraspinatus and posterior  deltoid               TODAY'S TREATMENT:  [facility] Adult PT Treatment:                                                DATE: 11/07/21 Therapeutic Exercise: UBE L4 x 4 min (2 fwd/bwd) while taking subjective Sleeper stretch 2 x 60 sec  Side cross body stretch 2 x 60 sec Standing IR functional reach behind back stretch with towel 5 x 10 sec Doorway pec stretch at 90 deg 2 x 30 sec Standing functional IR behind back step out stretch 5 x 10 sec Prone T thumb up with 2# 2 x 10 Prone Y with 2# 2 x 10 Prone I with 2# 2 x 10 Quadruped scapular protraction/retraction 2 x 15 Prone ER at 90 deg with 2# 2 x 15 Standing scaption to 90 deg with 5# 2 x 15   [facility] Adult PT Treatment:                                                DATE: 10/24/21 Therapeutic Exercise: UBE level 3.5 x 4 min fwd Sleeper stretch 3 x 30 sec  Resisted ER at 90/90 red band 2 x 10  Resisted IR at 90/90 green band 2 x 10  Prone T on stability ball 2 x 15 Prone Y on stability ball 2 x 15  Serratus punch 2 x 15; 10 lbs  Functional IR with strap x 10; 5 sec hold  Updated HEP Manual therapy: Skilled palpation and monitoring of muscle tension while performing  STM/TPR right rotator cuff Scapular mobilizations inferior/superior, and upward/downward rotation ER/IR stretching Rt shoulder  Active release to Rt lat  Rt GHJ mobilizations grade II-III A/P and inferior Trigger Point Dry Needling Treatment: Pre-treatment instruction: Patient instructed on dry needling rationale, procedures, and possible side effects including pain during treatment (achy,cramping feeling), bruising, drop of blood, lightheadedness, nausea, sweating. Patient Consent Given: Yes Education handout provided: No Muscles treated: Rt infraspinatus and subscapularis   Treatment response/outcome: Twitch response elicited and Palpable decrease in muscle tension Post-treatment instructions: Patient instructed to expect possible mild to moderate muscle soreness later  today and/or tomorrow. Patient instructed in methods to reduce muscle soreness and to continue prescribed HEP. If patient was dry needled over the lung field, patient was instructed on signs and symptoms of pneumothorax and, however unlikely, to see immediate medical attention should they occur. Patient was also educated on signs and symptoms of infection and to seek medical attention should they occur. Patient verbalized understanding of these instructions and education.  [facility] Adult PT Treatment:  DATE: 10/10/21 Therapeutic Exercise: UBE level 3.5 x 2 min each fwd/bwd Sleeper stretch 2 x 30 sec Cross body stretch 2 x 30 sec Childs pose x 60 sec  Prone retraction with ER 2 x 10 bilateral  Scapular wall clocks with red ball 2 x 5 each with therapist assist for scapular rotation on the RUE Resisted IR at 90/90 green band 2 x 10  Manual Therapy: Skilled palpation and monitoring of muscle tension while performing  STM/TPR right rotator cuff Scapular mobilizations inferior/superior, and upward/downward rotation Trigger Point Dry Needling Treatment: Pre-treatment instruction: Patient instructed on dry needling rationale, procedures, and possible side effects including pain during treatment (achy,cramping feeling), bruising, drop of blood, lightheadedness, nausea, sweating. Patient Consent Given: Yes Education handout provided: Previously provided Muscles treated: Right infraspinatus, subscapularis  Treatment response/outcome: Twitch response and reduce muscle tension Post-treatment instructions: Patient instructed to expect possible mild to moderate muscle soreness later today and/or tomorrow. Patient instructed in methods to reduce muscle soreness and to continue prescribed HEP. If patient was dry needled over the lung field, patient was instructed on signs and symptoms of pneumothorax and, however unlikely, to see immediate medical attention should they  occur. Patient was also educated on signs and symptoms of infection and to seek medical attention should they occur. Patient verbalized understanding of these instructions and education.  PATIENT EDUCATION: Education details: POC discharge, HEP Person educated: Patient  Education method: Instruction Education comprehension: Verbalized understanding    HOME EXERCISE PROGRAM: Access Code: [email or web address]     ASSESSMENT: CLINICAL IMPRESSION: Patient tolerated therapy well with no adverse effects. He has improved greatly with therapy and demonstrates overall improvement in his strength, mobility, and function. He does continue to report pain mainly with heavy lifting and tightness with reaching behind his back. He will be formally discharged from therapy and instructed in following-up with his doctor as needed.   OBJECTIVE IMPAIRMENTS decreased strength, postural dysfunction, and pain.    ACTIVITY LIMITATIONS lifting   PARTICIPATION LIMITATIONS: cleaning, occupation, and yard work   PERSONAL FACTORS Time since onset of injury/illness/exacerbation are also affecting patient's functional outcome.      GOALS: Goals reviewed with patient? Yes   SHORT TERM GOALS: Target date: 09/12/2021    Patient will be I with initial HEP in order to progress with therapy. Baseline: HEP provided at eval 09/12/2021: independent with initial HEP Goal status: MET   2.  PT will review FOTO with patient by 3rd visit in order to understand expected progress and outcome with therapy. Baseline: FOTO assessed at eval 09/12/2021: reviewed Goal status: MET   3.  Patient will report pain no more than 5/10 with activity in order to reduce functional limitations and indicate improved activity tolerance. Baseline: 7-8/10 09/12/2021: reports < 5/10 pain Goal status: MET   LONG TERM GOALS: Target date: 11/07/2021     Patient will be I with final HEP to maintain progress from PT. Baseline: HEP provided at eval 09/26/2021:  progressing 11/07/2021: independent Goal status: MET   2.  Patient will report >/= 77% status on FOTO to indicate improved functional ability. Baseline: 68% 09/26/2021: 81% Goal status: MET   3.  Patent will exhibit right rotator cuff strength 5/5 and periscapular strength >/= 4/5 MMT in order to improve lifting ability with the right arm Baseline: right rotator cuff 4+/5, periscapular 4-/5 MMT 09/26/2021: periscapular strength 4-/5 MMT 11/07/2021: strength to 4/5 MMT Goal status: MET   4.  Patient will report no pain or limitation with  lifting using the right arm or with reaching behind back. Baseline: patient reports increased right shoulder with lifting and reaching behind back 09/26/2021: continues to have pain reaching behind back 11/07/2021: patient continues to report tightness with reaching behind back Goal status: PARTIALLY MET     PLAN: PT FREQUENCY: NA - discharge   PT DURATION: NA - discharge   PLANNED INTERVENTIONS: Therapeutic exercises, Therapeutic activity, Neuromuscular re-education, Balance training, Gait training, Patient/Family education, Joint manipulation, Joint mobilization, Aquatic Therapy, Dry Needling, Electrical stimulation, Spinal manipulation, Spinal mobilization, Cryotherapy, Moist heat, Taping, Ionotophoresis 61m/ml Dexamethasone, Manual therapy, and Re-evaluation   PLAN FOR NEXT SESSION: NA - discharge   CHilda Blades PT, DPT, LAT, ATC 11/07/21  9:52 AM Phone: [phone]: [phone]  PHYSICAL THERAPY DISCHARGE SUMMARY  Visits from Start of Care: 9  Current functional level related to goals / functional outcomes: See above   Remaining deficits: See above   Education / Equipment: HEP   Patient agrees to discharge. Patient goals were partially met. Patient is being discharged due to being pleased with the current functional level.

## 2021-11-07 ENCOUNTER — Other Ambulatory Visit: Payer: Self-pay

## 2021-11-07 ENCOUNTER — Encounter: Payer: Self-pay | Admitting: Physical Therapy

## 2021-11-07 ENCOUNTER — Ambulatory Visit: Payer: BC Managed Care – PPO | Attending: Sports Medicine | Admitting: Physical Therapy

## 2021-11-07 DIAGNOSIS — F900 Attention-deficit hyperactivity disorder, predominantly inattentive type: Secondary | ICD-10-CM | POA: Diagnosis not present

## 2021-11-07 DIAGNOSIS — M6281 Muscle weakness (generalized): Secondary | ICD-10-CM | POA: Diagnosis not present

## 2021-11-07 DIAGNOSIS — G8929 Other chronic pain: Secondary | ICD-10-CM | POA: Insufficient documentation

## 2021-11-07 DIAGNOSIS — F3342 Major depressive disorder, recurrent, in full remission: Secondary | ICD-10-CM | POA: Diagnosis not present

## 2021-11-07 DIAGNOSIS — M25511 Pain in right shoulder: Secondary | ICD-10-CM | POA: Diagnosis not present

## 2021-11-07 DIAGNOSIS — F411 Generalized anxiety disorder: Secondary | ICD-10-CM | POA: Diagnosis not present

## 2021-11-07 NOTE — Patient Instructions (Signed)
Access Code: [email or web address] URL: [email or web address] Date: 11/07/2021 Prepared by: Rosana Hoes  Exercises - Standing Upper Trapezius Mobilization with Small Ball  - Sleeper Stretch  - 3 reps - 30 seconds hold - Sidelying Posterior Cuff Cross Body Stretch  - 3 sets - 60 seconds hold - Standing Shoulder Internal Rotation Stretch with Towel  - 10 reps - 5 sec  hold - Doorway Pec Stretch at 90 Degrees Abduction  - 3 reps - 30 sec  hold - Prone Shoulder Horizontal Abduction  - 3 sets - 15 reps - Prone Single Arm Shoulder Y  - 3 sets - 15 reps - Prone Shoulder Extension - Single Arm  - 3 sets - 15 reps - Prone Shoulder External Rotation  - 3 sets - 15 reps - Quadruped Scapular Protraction and Retraction  - 3 sets - 15 reps - Scaption with Dumbbells  - 3 sets - 15 reps

## 2021-11-13 ENCOUNTER — Ambulatory Visit: Payer: Self-pay

## 2021-11-13 ENCOUNTER — Ambulatory Visit (INDEPENDENT_AMBULATORY_CARE_PROVIDER_SITE_OTHER): Payer: BC Managed Care – PPO | Admitting: Sports Medicine

## 2021-11-13 VITALS — BP 118/80 | HR 83 | Ht 71.0 in | Wt 213.0 lb

## 2021-11-13 DIAGNOSIS — G8929 Other chronic pain: Secondary | ICD-10-CM | POA: Diagnosis not present

## 2021-11-13 DIAGNOSIS — M25511 Pain in right shoulder: Secondary | ICD-10-CM

## 2021-11-13 DIAGNOSIS — M7581 Other shoulder lesions, right shoulder: Secondary | ICD-10-CM | POA: Diagnosis not present

## 2021-11-13 MED ORDER — MELOXICAM 15 MG PO TABS: 15.0000 mg | ORAL_TABLET | Freq: Every day | ORAL | 0 refills | Status: DC

## 2021-11-13 NOTE — Progress Notes (Signed)
[name] [address] [place] [address] Phone: [phone]   Assessment and Plan:     1. Chronic right shoulder pain 2. Right rotator cuff tendonitis  -Chronic with exacerbation, subsequent visit - Overall patient had moderate improvement in right shoulder pain after 9 PT visits and was discharged from PT last week, however new flare of pain after falling onto shoulder most consistent with contusion and rotator cuff strain.  No major findings on ultrasound - Start meloxicam 15 mg daily x2 weeks.  If still having pain after 2 weeks, complete 3rd-week of meloxicam. May use remaining meloxicam as needed once daily for pain control.  Do not to use additional NSAIDs while taking meloxicam.  May use Tylenol [phone] 2 to 3 times a day for breakthrough pain.  - Continue HEP  Sports Medicine: Musculoskeletal Ultrasound.   Exam:Right   Shoulder Exam.  Diagnosis: Right shoulder pain  Biceps tendon: Normal Subscapularis: Normal Supraspinatus: Normal Infraspinatus/teres minor: Normal Glenohumeral joint (posterior): Normal Acromioclavicular joint: Normal Additional findings: None    Impression:  Unremarkable exam   Pertinent previous records reviewed include none   Follow Up: As needed if no improvement or worsening of symptoms in 2 to 3 weeks   Subjective:   I, Terry [name], am serving as a Neurosurgeon for Doctor [employer]  Chief Complaint: R shoulder pain   HPI:   11/13/21 Patient is a 36 year old male complaining of shoulder pain. Patient states He fell on his shoulder he misstep on a floor joyce yesterday, it is feeling better than yesterday, no numbness or tingling , anterior and posterior shoulder pain, took tylenol and that seemed to help a little , he also iced , has tried to move the shoulder is babying it.   Relevant Historical Information: None pertinent  Additional pertinent review of systems  negative.   Current Outpatient Medications:    buPROPion (WELLBUTRIN XL) 300 MG 24 hr tablet, Take 300 mg by mouth daily., Disp: , Rfl:    clonazePAM (KLONOPIN) 0.5 MG tablet, Take 1 tablet (0.5 mg total) by mouth 2 (two) times daily as needed for anxiety., Disp: 30 tablet, Rfl: 1   Dextromethorphan-buPROPion ER (AUVELITY) 45-105 MG TBCR, Take by mouth., Disp: , Rfl:    levothyroxine (SYNTHROID) 50 MCG tablet, TAKE 1 TABLET BY MOUTH EVERY DAY, Disp: 90 tablet, Rfl: 1   lisdexamfetamine (VYVANSE) 50 MG capsule, Take 1 capsule (50 mg total) by mouth daily., Disp: 30 capsule, Rfl: 0   meloxicam (MOBIC) 15 MG tablet, Take 1 tablet (15 mg total) by mouth daily., Disp: 30 tablet, Rfl: 0   OLANZapine (ZYPREXA) 5 MG tablet, TAKE 1 TABLET BY MOUTH EVERYDAY AT BEDTIME, Disp: 90 tablet, Rfl: 1   sildenafil (VIAGRA) 100 MG tablet, Take 0.5-1 tablets (50-100 mg total) by mouth daily as needed for erectile dysfunction., Disp: 5 tablet, Rfl: 11   SPRAVATO, 84 MG DOSE, 28 MG/DEVICE SOPK, Place into both nostrils., Disp: , Rfl:    traZODone (DESYREL) 100 MG tablet, Take 1 tablet (100 mg total) by mouth at bedtime., Disp: 90 tablet, Rfl: 0   valACYclovir (VALTREX) 1000 MG tablet, Take 1 tablet (1,000 mg total) by mouth 2 (two) times daily as needed., Disp: 30 tablet, Rfl: 0   Objective:     Vitals:   11/13/21 0753  BP: 118/80  Pulse: 83  SpO2: 96%  Weight: 213 lb (96.6 kg)  Height: 5\' 11"  (1.803 m)  Body mass index is 29.71 kg/m.    Physical Exam:    Gen: Appears well, nad, nontoxic and pleasant Neuro:sensation intact, strength is 5/5 with df/pf/inv/ev, muscle tone wnl Skin: no suspicious lesion or defmority Psych: A&O, appropriate mood and affect  Right shoulder: no deformity, swelling or muscle wasting No scapular winging FF 180 with painful arc, abd 180 with painful arc, int 5 with painful arc, ext 90 TTP anterior shoulder musculature NTTP over the [place], clavicle, ac, coracoid, biceps  groove, humerus, deltoid, trapezius, cervical spine Positive Neer, Hawking's, empty can, O'Brien Neg   subscap liftoff, speeds,   Neg ant drawer, sulcus sign, apprehension Negative Spurling's test bilat FROM of neck    Electronically signed by:  Benito Mccreedy D.[facility] 8:02 AM 11/13/21

## 2021-11-13 NOTE — Patient Instructions (Addendum)
Good to see you  Continue HEP  - Start meloxicam 15 mg daily x2 weeks.  If still having pain after 2 weeks, complete 3rd-week of meloxicam. May use remaining meloxicam as needed once daily for pain control.  Do not to use additional NSAIDs while taking meloxicam.  May use Tylenol [phone] mg 2 to 3 times a day for breakthrough pain. As needed follow up if no improvement we will see you in 2-3 weeks

## 2021-11-19 ENCOUNTER — Other Ambulatory Visit: Payer: Self-pay

## 2021-11-19 NOTE — Telephone Encounter (Signed)
Pharmacy faxed a refill request for OLANZapine (ZYPREXA) 5 MG tablet [[id number]]    Order Details Dose, Route, Frequency: As Directed  Dispense Quantity: 90 tablet Refills: 1        Sig: TAKE 1 TABLET BY MOUTH EVERYDAY AT BEDTIME       Start Date: 11/07/20 End Date: --  Written Date: 11/07/20 Expiration Date: 11/07/21  Original Order:  OLANZapine (ZYPREXA) 5 MG tablet [[id number]]

## 2021-11-20 MED ORDER — OLANZAPINE 5 MG PO TABS: 5.0000 mg | ORAL_TABLET | Freq: Every day | ORAL | 1 refills | Status: DC

## 2021-11-20 NOTE — Telephone Encounter (Signed)
Requested medication (s) are due for refill today: yes  Requested medication (s) are on the active medication list: yes    Last refill: 11/07/20  #90  1 refill  Future visit scheduled no  Notes to clinic:Not delegated, please review.  Thank you.     OV 04/10/21  Requested Prescriptions  Pending Prescriptions Disp Refills   OLANZapine (ZYPREXA) 5 MG tablet 90 tablet 1     Not Delegated - Psychiatry:  Antipsychotics - Second Generation (Atypical) - olanzapine Failed - 11/19/2021 10:23 AM      Failed - This refill cannot be delegated      Failed - Valid encounter within last 6 months    Recent Outpatient Visits           7 months ago Dark red stool   [place] Pickard, Cammie Mcgee, MD   9 months ago Other chest pain   [place] Eulogio Bear, NP   11 months ago Other specified hypothyroidism   [place] Schaumann, Cammie Mcgee, MD   1 year ago Severe major depression without psychotic features [facility])   [place] Pickard, Cammie Mcgee, MD   1 year ago ADD (attention deficit disorder) without hyperactivity   [place] Pickard, Cammie Mcgee, MD              Failed - Lipid Panel in normal range within the last 12 months    Cholesterol  Date Value Ref Range Status  06/06/2020 144 <200 mg/dL Final   LDL Cholesterol (Calc)  Date Value Ref Range Status  06/06/2020 67 mg/dL (calc) Final    Comment:    Reference range: <100 . Desirable range <100 mg/dL for primary prevention;   <70 mg/dL for patients with CHD or diabetic patients  with > or = 2 CHD risk factors. Marland Kitchen LDL-C is now calculated using the Martin-Hopkins  calculation, which is a validated novel method providing  better accuracy than the Friedewald equation in the  estimation of LDL-C.  Cresenciano Genre et al. Annamaria Helling. 2778;242(35): 2061-2068  (http://education.[facility].com/faq/FAQ164)    HDL  Date Value Ref Range Status  06/06/2020  57 > OR = 40 mg/dL Final   Triglycerides  Date Value Ref Range Status  06/06/2020 118 <150 mg/dL Final         Failed - CMP within normal limits and completed in the last 12 months    Albumin  Date Value Ref Range Status  06/27/2016 4.5 3.6 - 5.1 g/dL Final   Alkaline Phosphatase  Date Value Ref Range Status  06/27/2016 35 (L) 40 - 115 U/L Final   Alkaline phosphatase (APISO)  Date Value Ref Range Status  04/10/2021 45 36 - 130 U/L Final   ALT  Date Value Ref Range Status  04/10/2021 36 9 - 46 U/L Final   AST  Date Value Ref Range Status  04/10/2021 21 10 - 40 U/L Final   BUN  Date Value Ref Range Status  04/10/2021 27 (H) 7 - 25 mg/dL Final   Calcium  Date Value Ref Range Status  04/10/2021 9.7 8.6 - 10.3 mg/dL Final   CO2  Date Value Ref Range Status  04/10/2021 25 20 - 32 mmol/L Final   Creat  Date Value Ref Range Status  04/10/2021 1.18 0.60 - 1.26 mg/dL Final   Glucose, Bld  Date Value Ref Range Status  04/10/2021 89 65 - 99 mg/dL Final    Comment:    .  Fasting reference interval .    Potassium  Date Value Ref Range Status  04/10/2021 4.4 3.5 - 5.3 mmol/L Final   Sodium  Date Value Ref Range Status  04/10/2021 139 135 - 146 mmol/L Final   Total Bilirubin  Date Value Ref Range Status  04/10/2021 0.7 0.2 - 1.2 mg/dL Final   Total Protein  Date Value Ref Range Status  04/10/2021 7.2 6.1 - 8.1 g/dL Final   GFR, Est African American  Date Value Ref Range Status  06/06/2020 112 > OR = 60 mL/min/1.55m Final   eGFR  Date Value Ref Range Status  04/10/2021 83 > OR = 60 mL/min/1.77mFinal    Comment:    The eGFR is based on the CKD-EPI 2021 equation. To calculate  the new eGFR from a previous Creatinine or Cystatin C result, go to https://www.kidney.org/professionals/ kdoqi/gfr%5Fcalculator    GFR, Est Non African American  Date Value Ref Range Status  06/06/2020 97 > OR = 60 mL/min/1.7315minal         Passed - TSH in  normal range and within 360 days    TSH  Date Value Ref Range Status  12/07/2020 1.22 0.40 - 4.50 mIU/L Final         Passed - Completed PHQ-2 or PHQ-9 in the last 360 days      Passed - Last BP in normal range    BP Readings from Last 1 Encounters:  11/13/21 118/80         Passed - Last Heart Rate in normal range    Pulse Readings from Last 1 Encounters:  11/13/21 83         Passed - CBC within normal limits and completed in the last 12 months    WBC  Date Value Ref Range Status  04/10/2021 8.4 3.8 - 10.8 Thousand/uL Final   RBC  Date Value Ref Range Status  04/10/2021 5.15 4.20 - 5.80 Million/uL Final   Hemoglobin  Date Value Ref Range Status  04/10/2021 16.0 13.2 - 17.1 g/dL Final   HCT  Date Value Ref Range Status  04/10/2021 46.5 38.5 - 50.0 % Final   MCHC  Date Value Ref Range Status  04/10/2021 34.4 32.0 - 36.0 g/dL Final   [facility] Value Ref Range Status  04/10/2021 31.1 27.0 - 33.0 pg Final   MCV  Date Value Ref Range Status  04/10/2021 90.3 80.0 - 100.0 fL Final   No results found for: "PLTCOUNTKUC", "LABPLAT", "POCPLA" RDW  Date Value Ref Range Status  04/10/2021 12.9 11.0 - 15.0 % Final

## 2021-11-21 DIAGNOSIS — F332 Major depressive disorder, recurrent severe without psychotic features: Secondary | ICD-10-CM | POA: Diagnosis not present

## 2021-11-29 NOTE — Progress Notes (Signed)
[name] [name] [address] [place] [address] Phone: [phone]   Assessment and Plan:     1. Chronic right shoulder pain 2. Right rotator cuff tendonitis  -Chronic with exacerbation, subsequent visit - Consistent with rotator cuff tendinitis versus subacromial bursitis flared with patient's fall prior to previous office visit and continued physical activity and physical work - Discontinue meloxicam - Continue HEP to prevent stiffness and rotator cuff - Reassuring that patient had unremarkable ultrasound at previous office visit on 11/13/2021 and recent unremarkable MRI - Patient elected for subacromial CSI.  Tolerated well per note below  Procedure: Subacromial Injection Side: Right  Risks explained and consent was given verbally. The site was cleaned with alcohol prep. A steroid injection was performed from posterior approach using 30mL of 1% lidocaine without epinephrine and 31mL of kenalog 40mg /ml. This was well tolerated and resulted in symptomatic relief.  Needle was removed, hemostasis achieved, and post injection instructions were explained.   Pt was advised to call or return to clinic if these symptoms worsen or fail to improve as anticipated.   Pertinent previous records reviewed include none   Follow Up: 2 to 3 weeks for reevaluation.  Could consider repeat ultrasound versus repeat physical therapy if no improvement or worsening of symptoms   Subjective:   I, [name], am serving as a for Doctor Neurosurgeon   Chief Complaint: R shoulder pain    HPI:    11/13/21 Patient is a 36 year old male complaining of shoulder pain. Patient states He fell on his shoulder he misstep on a floor joyce yesterday, it is feeling better than yesterday, no numbness or tingling , anterior and posterior shoulder pain, took tylenol and that seemed to help a little , he also iced , has tried to move the shoulder is babying  it.   11/30/2021 Patient states that shoulder pain is worst    Relevant Historical Information: None pertinent  Additional pertinent review of systems negative.   Current Outpatient Medications:    buPROPion (WELLBUTRIN XL) 300 MG 24 hr tablet, Take 300 mg by mouth daily., Disp: , Rfl:    clonazePAM (KLONOPIN) 0.5 MG tablet, Take 1 tablet (0.5 mg total) by mouth 2 (two) times daily as needed for anxiety., Disp: 30 tablet, Rfl: 1   Dextromethorphan-buPROPion ER (AUVELITY) 45-105 MG TBCR, Take by mouth., Disp: , Rfl:    levothyroxine (SYNTHROID) 50 MCG tablet, TAKE 1 TABLET BY MOUTH EVERY DAY, Disp: 90 tablet, Rfl: 1   lisdexamfetamine (VYVANSE) 50 MG capsule, Take 1 capsule (50 mg total) by mouth daily., Disp: 30 capsule, Rfl: 0   meloxicam (MOBIC) 15 MG tablet, Take 1 tablet (15 mg total) by mouth daily., Disp: 30 tablet, Rfl: 0   meloxicam (MOBIC) 15 MG tablet, Take 1 tablet (15 mg total) by mouth daily., Disp: 30 tablet, Rfl: 0   OLANZapine (ZYPREXA) 5 MG tablet, Take 1 tablet (5 mg total) by mouth at bedtime., Disp: 90 tablet, Rfl: 1   sildenafil (VIAGRA) 100 MG tablet, Take 0.5-1 tablets (50-100 mg total) by mouth daily as needed for erectile dysfunction., Disp: 5 tablet, Rfl: 11   SPRAVATO, 84 MG DOSE, 28 MG/DEVICE SOPK, Place into both nostrils., Disp: , Rfl:    traZODone (DESYREL) 100 MG tablet, Take 1 tablet (100 mg total) by mouth at bedtime., Disp: 90 tablet, Rfl: 0   valACYclovir (VALTREX) 1000 MG tablet, Take 1 tablet (1,000 mg total) by  mouth 2 (two) times daily as needed., Disp: 30 tablet, Rfl: 0   Objective:     Vitals:   11/30/21 0830  BP: 118/84  Pulse: 67  SpO2: 95%  Weight: 210 lb (95.3 kg)  Height: 5\' 11"  (1.803 m)      Body mass index is 29.29 kg/m.    Physical Exam:      Gen: Appears well, nad, nontoxic and pleasant Neuro:sensation intact, strength is 5/5 with df/pf/inv/ev, muscle tone wnl Skin: no suspicious lesion or defmority Psych: A&O, appropriate  mood and affect   Right shoulder: no deformity, swelling or muscle wasting No scapular winging FF 180 with painful arc, abd 180 with painful arc, int 5 with painful arc, ext 90 TTP anterior shoulder musculature NTTP over the [place], clavicle, ac, coracoid, biceps groove, humerus, deltoid, trapezius, cervical spine Positive Neer, Hawking's, empty can, O'Brien Neg   subscap liftoff, speeds,   Neg ant drawer, sulcus sign, apprehension Negative Spurling's test bilat FROM of neck    Electronically signed by:  Benito Mccreedy D.[facility] 8:47 AM 11/30/21

## 2021-11-30 ENCOUNTER — Ambulatory Visit (INDEPENDENT_AMBULATORY_CARE_PROVIDER_SITE_OTHER): Payer: BC Managed Care – PPO | Admitting: Sports Medicine

## 2021-11-30 VITALS — BP 118/84 | HR 67 | Ht 71.0 in | Wt 210.0 lb

## 2021-11-30 DIAGNOSIS — M25511 Pain in right shoulder: Secondary | ICD-10-CM | POA: Diagnosis not present

## 2021-11-30 DIAGNOSIS — M7581 Other shoulder lesions, right shoulder: Secondary | ICD-10-CM | POA: Diagnosis not present

## 2021-11-30 DIAGNOSIS — G8929 Other chronic pain: Secondary | ICD-10-CM

## 2021-11-30 NOTE — Patient Instructions (Addendum)
Good to see you  2-3 week follow up   

## 2021-12-05 DIAGNOSIS — M25511 Pain in right shoulder: Secondary | ICD-10-CM | POA: Diagnosis not present

## 2021-12-19 ENCOUNTER — Ambulatory Visit: Payer: BC Managed Care – PPO | Admitting: Family Medicine

## 2021-12-19 DIAGNOSIS — F332 Major depressive disorder, recurrent severe without psychotic features: Secondary | ICD-10-CM | POA: Diagnosis not present

## 2022-01-09 DIAGNOSIS — F332 Major depressive disorder, recurrent severe without psychotic features: Secondary | ICD-10-CM | POA: Diagnosis not present

## 2022-01-14 DIAGNOSIS — M25511 Pain in right shoulder: Secondary | ICD-10-CM | POA: Diagnosis not present

## 2022-01-18 ENCOUNTER — Other Ambulatory Visit: Payer: Self-pay | Admitting: Family Medicine

## 2022-01-21 MED ORDER — LISDEXAMFETAMINE DIMESYLATE 50 MG PO CAPS: 50.0000 mg | ORAL_CAPSULE | Freq: Every day | ORAL | 0 refills | Status: DC

## 2022-01-25 ENCOUNTER — Telehealth: Payer: Self-pay

## 2022-01-25 NOTE — Telephone Encounter (Signed)
Prescription Request  01/25/2022  Is this a "Controlled Substance" medicine? Yes  LOV: Visit date not found  What is the name of the medication or equipment? traZODone (DESYREL) 100 MG tablet [[id number]]   Have you contacted your pharmacy to request a refill? Yes   Which pharmacy would you like this sent to?  [id number] [place], [place] - [address] [place] [address] Phone: [phone] Fax: [phone]    Patient notified that their request is being sent to the clinical staff for review and that they should receive a response within 2 business days.   Please advise at Mobile [phone] (mobile)

## 2022-01-28 ENCOUNTER — Other Ambulatory Visit: Payer: Self-pay | Admitting: Family Medicine

## 2022-01-28 MED ORDER — TRAZODONE HCL 100 MG PO TABS: 100.0000 mg | ORAL_TABLET | Freq: Every day | ORAL | 3 refills | Status: DC

## 2022-01-30 DIAGNOSIS — M25511 Pain in right shoulder: Secondary | ICD-10-CM | POA: Diagnosis not present

## 2022-02-04 DIAGNOSIS — M25511 Pain in right shoulder: Secondary | ICD-10-CM | POA: Diagnosis not present

## 2022-02-13 DIAGNOSIS — F332 Major depressive disorder, recurrent severe without psychotic features: Secondary | ICD-10-CM | POA: Diagnosis not present

## 2022-02-14 ENCOUNTER — Other Ambulatory Visit: Payer: Self-pay

## 2022-02-14 DIAGNOSIS — F3342 Major depressive disorder, recurrent, in full remission: Secondary | ICD-10-CM | POA: Diagnosis not present

## 2022-02-14 DIAGNOSIS — F900 Attention-deficit hyperactivity disorder, predominantly inattentive type: Secondary | ICD-10-CM | POA: Diagnosis not present

## 2022-02-14 DIAGNOSIS — F410 Panic disorder [episodic paroxysmal anxiety] without agoraphobia: Secondary | ICD-10-CM | POA: Diagnosis not present

## 2022-02-14 MED ORDER — LEVOTHYROXINE SODIUM 50 MCG PO TABS: 50.0000 ug | ORAL_TABLET | Freq: Every day | ORAL | 0 refills | Status: DC

## 2022-02-15 ENCOUNTER — Other Ambulatory Visit: Payer: Self-pay

## 2022-02-15 DIAGNOSIS — E038 Other specified hypothyroidism: Secondary | ICD-10-CM

## 2022-02-15 MED ORDER — LEVOTHYROXINE SODIUM 50 MCG PO TABS: 50.0000 ug | ORAL_TABLET | Freq: Every day | ORAL | 0 refills | Status: DC

## 2022-03-13 DIAGNOSIS — F332 Major depressive disorder, recurrent severe without psychotic features: Secondary | ICD-10-CM | POA: Diagnosis not present

## 2022-03-20 DIAGNOSIS — F332 Major depressive disorder, recurrent severe without psychotic features: Secondary | ICD-10-CM | POA: Diagnosis not present

## 2022-03-22 ENCOUNTER — Other Ambulatory Visit: Payer: Self-pay | Admitting: Family Medicine

## 2022-03-22 DIAGNOSIS — E038 Other specified hypothyroidism: Secondary | ICD-10-CM

## 2022-03-22 NOTE — Telephone Encounter (Signed)
PT not a PT of [facility]

## 2022-03-22 NOTE — Telephone Encounter (Signed)
Thank you for making me aware. REROUTED to [place].

## 2022-03-22 NOTE — Telephone Encounter (Signed)
Requested medication (s) are due for refill today: yes  Requested medication (s) are on the active medication list: yes  Last refill:  02/15/22 #30  Future visit scheduled: yes  Notes to clinic:  overdue TSH 12/07/20   Requested Prescriptions  Pending Prescriptions Disp Refills   levothyroxine (SYNTHROID) 50 MCG tablet [Pharmacy Med Name: LEVOTHYROXINE 50 MCG TABLET] 30 tablet 0    Sig: TAKE 1 [place]     Endocrinology:  Hypothyroid Agents Failed - 03/22/2022  1:47 AM      Failed - TSH in normal range and within 360 days    TSH  Date Value Ref Range Status  12/07/2020 1.22 0.40 - 4.50 mIU/L Final         Passed - Valid encounter within last 12 months    Recent Outpatient Visits           11 months ago Dark red stool   [place] Pickard, Cammie Mcgee, MD   1 year ago Other chest pain   [place] Eulogio Bear, NP   1 year ago Other specified hypothyroidism   [place] Dennard Schaumann, Cammie Mcgee, MD   1 year ago Severe major depression without psychotic features [facility])   [place] Pickard, Cammie Mcgee, MD   1 year ago ADD (attention deficit disorder) without hyperactivity   [place] Pickard, Cammie Mcgee, MD       Future Appointments             In 2 weeks Pickard, Cammie Mcgee, MD [place], [facility]

## 2022-03-28 ENCOUNTER — Other Ambulatory Visit: Payer: Self-pay

## 2022-03-28 DIAGNOSIS — E038 Other specified hypothyroidism: Secondary | ICD-10-CM

## 2022-03-29 ENCOUNTER — Other Ambulatory Visit: Payer: Self-pay | Admitting: Family Medicine

## 2022-03-29 DIAGNOSIS — E038 Other specified hypothyroidism: Secondary | ICD-10-CM

## 2022-03-29 MED ORDER — LEVOTHYROXINE SODIUM 50 MCG PO TABS: 50.0000 ug | ORAL_TABLET | Freq: Every day | ORAL | 0 refills | Status: DC

## 2022-04-09 ENCOUNTER — Encounter: Payer: Self-pay | Admitting: Family Medicine

## 2022-04-09 ENCOUNTER — Ambulatory Visit (INDEPENDENT_AMBULATORY_CARE_PROVIDER_SITE_OTHER): Payer: BC Managed Care – PPO | Admitting: Family Medicine

## 2022-04-09 VITALS — BP 118/66 | HR 71 | Temp 98.8°F | Ht 71.0 in | Wt 205.0 lb

## 2022-04-09 DIAGNOSIS — I781 Nevus, non-neoplastic: Secondary | ICD-10-CM

## 2022-04-09 DIAGNOSIS — E038 Other specified hypothyroidism: Secondary | ICD-10-CM | POA: Diagnosis not present

## 2022-04-09 DIAGNOSIS — Z0001 Encounter for general adult medical examination with abnormal findings: Secondary | ICD-10-CM | POA: Diagnosis not present

## 2022-04-09 DIAGNOSIS — Z0000 Encounter for general adult medical examination without abnormal findings: Secondary | ICD-10-CM

## 2022-04-09 NOTE — Progress Notes (Signed)
Subjective:    Patient ID: Terry [name], male    DOB: [birth year], 37 y.o.   MRN: [id number] Patient is a 37 year old patient here to fill exam.  He is now seeking care with a psychiatrist for his depression.  They are treating him with a combination of Zyprexa, ketamine, dextromethorphan/Wellbutrin, and lithium.  He states that he is doing well emotionally.  Since I last saw the patient he had the birth of his first child who is now 89 months old.  He does have an unusual rash on his neck and chest.  This consist of red splotches that look like telangiectasias.  There are numerous telangiectasias covering his neck and his upper chest.  He denies any itching or pain or burning.  There is no pustules or papules.  There is no scale.  He would like to see a dermatologist.  I am uncertain what the cause of his rashes.  He denies any bleeding or bruising.  There are no petechia on his extremities.  There is no purpura.  He declines a flu shot.  He declines RSV vaccine but I did discuss this with him given his newborn child. Past Medical History:  Diagnosis Date   Acute appendicitis 02/08/2013   ADHD (attention deficit hyperactivity disorder)    Allergic rhinitis    Appendicitis, acute 02/08/2013   Depression    since 8th grade, hospitalized 2015 ([place])   HSV infection    Past Surgical History:  Procedure Laterality Date   APPENDECTOMY     2013   LAPAROSCOPIC APPENDECTOMY N/A 02/08/2013   Procedure: APPENDECTOMY LAPAROSCOPIC;  Surgeon: Earnstine Regal, MD;  Location: [facility];  Service: General;  Laterality: N/A;   WISDOM TOOTH EXTRACTION     Current Outpatient Medications on File Prior to Visit  Medication Sig Dispense Refill   clonazePAM (KLONOPIN) 0.5 MG tablet Take 1 tablet (0.5 mg total) by mouth 2 (two) times daily as needed for anxiety. 30 tablet 1   Dextromethorphan-buPROPion ER (AUVELITY) 45-105 MG TBCR Take by mouth.     levothyroxine (SYNTHROID) 50 MCG tablet Take 1 tablet (50 mcg  total) by mouth daily. 30 tablet 0   lisdexamfetamine (VYVANSE) 50 MG capsule Take 1 capsule (50 mg total) by mouth daily. 30 capsule 0   lithium carbonate (ESKALITH) 450 MG ER tablet SMARTSIG:1 Tablet(s) By Mouth Every Evening     OLANZapine (ZYPREXA) 5 MG tablet Take 1 tablet (5 mg total) by mouth at bedtime. 90 tablet 1   SPRAVATO, 84 MG DOSE, 28 MG/DEVICE SOPK Place into both nostrils.     traZODone (DESYREL) 100 MG tablet Take 1 tablet (100 mg total) by mouth at bedtime. 90 tablet 3   valACYclovir (VALTREX) 1000 MG tablet Take 1 tablet (1,000 mg total) by mouth 2 (two) times daily as needed. 30 tablet 0   No current facility-administered medications on file prior to visit.   No Known Allergies Social History   Socioeconomic History   Marital status: Single    Spouse name: Not on file   Number of children: 0   Years of education: Not on file   Highest education level: Bachelor's degree (e.g., BA, AB, BS)  Occupational History   Occupation: works in garden department    Comment: [employer]   Tobacco Use   Smoking status: Former    Packs/day: 0.50    Years: 3.00    Total pack years: 1.50    Types: Cigarettes    Quit date:  2009    Years since quitting: 15.1   Smokeless tobacco: Never  Vaping Use   Vaping Use: Never used  Substance and Sexual Activity   Alcohol use: Yes    Alcohol/week: 9.0 standard drinks of alcohol    Types: 9 Cans of beer per week    Comment: 2-3   Drug use: Yes    Frequency: 7.0 times per week    Types: Marijuana   Sexual activity: Not on file  Other Topics Concern   Not on file  Social History Narrative   Lives at home alone   Right handed   Caffeine: 1 cup each morning    Social Determinants of Health   Financial Resource Strain: Not on file  Food Insecurity: Not on file  Transportation Needs: Not on file  Physical Activity: Not on file  Stress: Not on file  Social Connections: Not on file  Intimate Partner Violence: Not on file   Family  History  Problem Relation Age of Onset   Allergic rhinitis Mother    Cancer Maternal Grandfather        bone and prostate     Review of Systems  All other systems reviewed and are negative.      Objective:   Physical Exam Vitals reviewed.  Constitutional:      General: He is not in acute distress.    Appearance: He is well-developed. He is not diaphoretic.  HENT:     Right Ear: Tympanic membrane and ear canal normal.     Left Ear: Tympanic membrane and ear canal normal.     Nose: No congestion or rhinorrhea.     Mouth/Throat:     Pharynx: No oropharyngeal exudate or posterior oropharyngeal erythema.  Eyes:     Extraocular Movements: Extraocular movements intact.     Conjunctiva/sclera: Conjunctivae normal.     Pupils: Pupils are equal, round, and reactive to light.  Cardiovascular:     Rate and Rhythm: Normal rate and regular rhythm.     Heart sounds: Normal heart sounds. No murmur heard.    No friction rub. No gallop.  Pulmonary:     Effort: Pulmonary effort is normal. No respiratory distress.     Breath sounds: Normal breath sounds. No wheezing or rales.  Chest:     Chest wall: No tenderness.  Abdominal:     General: Bowel sounds are normal. There is no distension.     Palpations: Abdomen is soft. There is no mass.     Tenderness: There is no abdominal tenderness. There is no guarding.  Musculoskeletal:     Right lower leg: No edema.     Left lower leg: No edema.  Skin:    Findings: Erythema and rash present.  Neurological:     General: No focal deficit present.     Mental Status: He is oriented to person, place, and time. Mental status is at baseline.     Motor: No weakness.     Coordination: Coordination normal.     Gait: Gait normal.     Deep Tendon Reflexes: Reflexes normal.  Psychiatric:        Speech: Speech normal.        Behavior: Behavior normal.        Thought Content: Thought content normal. Thought content is not paranoid or delusional. Thought  content does not include homicidal or suicidal ideation. Thought content does not include homicidal or suicidal plan.        Judgment: Judgment  normal.           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  I encouraged him to follow-up with his psychiatrist but I am grateful that he is seeing benefit.  I recommended a flu shot as well as the RSV vaccine.  I believe the rash or telangiectasias however I am not certain why he is developing this vague macular rash with poorly circumscribed borders.  Therefore I will consult dermatology in addition to checking a CBC to evaluate for any thrombocytopenia.  Check CBC CMP lipid panel and TSH given his history of hypothyroidism.

## 2022-04-10 LAB — CBC WITH DIFFERENTIAL/PLATELET
Absolute Monocytes: 387 cells/uL (ref 200–950)
Basophils Absolute: 29 cells/uL (ref 0–200)
Basophils Relative: 0.4 %
Eosinophils Absolute: 58 cells/uL (ref 15–500)
Eosinophils Relative: 0.8 %
HCT: 43.4 % (ref 38.5–50.0)
Hemoglobin: 15 g/dL (ref 13.2–17.1)
Lymphs Abs: 2723 cells/uL (ref 850–3900)
MCH: 31.1 pg (ref 27.0–33.0)
MCHC: 34.6 g/dL (ref 32.0–36.0)
MCV: 90 fL (ref 80.0–100.0)
MPV: 9.4 fL (ref 7.5–12.5)
Monocytes Relative: 5.3 %
Neutro Abs: 4103 cells/uL (ref 1500–7800)
Neutrophils Relative %: 56.2 %
Platelets: 250 10*3/uL (ref 140–400)
RBC: 4.82 10*6/uL (ref 4.20–5.80)
RDW: 13.6 % (ref 11.0–15.0)
Total Lymphocyte: 37.3 %
WBC: 7.3 10*3/uL (ref 3.8–10.8)

## 2022-04-10 LAB — TSH: TSH: 1.98 mIU/L (ref 0.40–4.50)

## 2022-04-10 LAB — COMPLETE METABOLIC PANEL WITH GFR
AG Ratio: 2.1 (calc) (ref 1.0–2.5)
ALT: 39 U/L (ref 9–46)
AST: 20 U/L (ref 10–40)
Albumin: 4.7 g/dL (ref 3.6–5.1)
Alkaline phosphatase (APISO): 45 U/L (ref 36–130)
BUN: 21 mg/dL (ref 7–25)
CO2: 22 mmol/L (ref 20–32)
Calcium: 9.7 mg/dL (ref 8.6–10.3)
Chloride: 108 mmol/L (ref 98–110)
Creat: 1.03 mg/dL (ref 0.60–1.26)
Globulin: 2.2 g/dL (calc) (ref 1.9–3.7)
Glucose, Bld: 123 mg/dL — ABNORMAL HIGH (ref 65–99)
Potassium: 4.5 mmol/L (ref 3.5–5.3)
Sodium: 139 mmol/L (ref 135–146)
Total Bilirubin: 0.5 mg/dL (ref 0.2–1.2)
Total Protein: 6.9 g/dL (ref 6.1–8.1)
eGFR: 97 mL/min/{1.73_m2} (ref >=60)

## 2022-04-10 LAB — LIPID PANEL
Cholesterol: 166 mg/dL (ref <200)
HDL: 63 mg/dL (ref >=40)
LDL Cholesterol (Calc): 88 mg/dL (calc)
Non-HDL Cholesterol (Calc): 103 mg/dL (calc) (ref <130)
Total CHOL/HDL Ratio: 2.6 (calc) (ref <5.0)
Triglycerides: 61 mg/dL (ref <150)

## 2022-04-11 ENCOUNTER — Encounter: Payer: Self-pay | Admitting: Family Medicine

## 2022-04-12 ENCOUNTER — Telehealth: Payer: Self-pay

## 2022-04-12 NOTE — Telephone Encounter (Signed)
Pt called in wanting to discuss recent lab results. Please advise.  Cb#: [phone]

## 2022-04-16 ENCOUNTER — Other Ambulatory Visit: Payer: BC Managed Care – PPO

## 2022-04-16 DIAGNOSIS — R7309 Other abnormal glucose: Secondary | ICD-10-CM

## 2022-04-17 DIAGNOSIS — F332 Major depressive disorder, recurrent severe without psychotic features: Secondary | ICD-10-CM | POA: Diagnosis not present

## 2022-04-17 LAB — HEMOGLOBIN A1C
Hgb A1c MFr Bld: 5.6 % of total Hgb (ref <5.7)
Mean Plasma Glucose: 114 mg/dL
eAG (mmol/L): 6.3 mmol/L

## 2022-04-25 ENCOUNTER — Encounter: Payer: Self-pay | Admitting: Family Medicine

## 2022-04-30 DIAGNOSIS — E291 Testicular hypofunction: Secondary | ICD-10-CM | POA: Diagnosis not present

## 2022-04-30 DIAGNOSIS — E559 Vitamin D deficiency, unspecified: Secondary | ICD-10-CM | POA: Diagnosis not present

## 2022-04-30 DIAGNOSIS — E785 Hyperlipidemia, unspecified: Secondary | ICD-10-CM | POA: Diagnosis not present

## 2022-04-30 DIAGNOSIS — Z79899 Other long term (current) drug therapy: Secondary | ICD-10-CM | POA: Diagnosis not present

## 2022-05-08 DIAGNOSIS — F4321 Adjustment disorder with depressed mood: Secondary | ICD-10-CM | POA: Diagnosis not present

## 2022-05-08 DIAGNOSIS — F900 Attention-deficit hyperactivity disorder, predominantly inattentive type: Secondary | ICD-10-CM | POA: Diagnosis not present

## 2022-05-08 DIAGNOSIS — F411 Generalized anxiety disorder: Secondary | ICD-10-CM | POA: Diagnosis not present

## 2022-05-08 DIAGNOSIS — F3341 Major depressive disorder, recurrent, in partial remission: Secondary | ICD-10-CM | POA: Diagnosis not present

## 2022-05-09 ENCOUNTER — Encounter: Payer: Self-pay | Admitting: Family Medicine

## 2022-05-15 DIAGNOSIS — F332 Major depressive disorder, recurrent severe without psychotic features: Secondary | ICD-10-CM | POA: Diagnosis not present

## 2022-05-15 DIAGNOSIS — M545 Low back pain, unspecified: Secondary | ICD-10-CM | POA: Diagnosis not present

## 2022-05-17 ENCOUNTER — Other Ambulatory Visit: Payer: BC Managed Care – PPO

## 2022-05-17 DIAGNOSIS — R5383 Other fatigue: Secondary | ICD-10-CM

## 2022-05-17 DIAGNOSIS — E038 Other specified hypothyroidism: Secondary | ICD-10-CM | POA: Diagnosis not present

## 2022-05-20 LAB — FSH/LH
FSH: 2.8 m[IU]/mL (ref 1.4–12.8)
LH: 2 m[IU]/mL (ref 1.5–9.3)

## 2022-05-20 LAB — TESTOSTERONE: Testosterone: 341 ng/dL (ref 250–827)

## 2022-05-21 ENCOUNTER — Other Ambulatory Visit: Payer: Self-pay | Admitting: Family Medicine

## 2022-05-21 NOTE — Telephone Encounter (Signed)
Requested medication (s) are due for refill today: yes  Requested medication (s) are on the active medication list: yes  Last refill:  11/20/21 #90/1  Future visit scheduled: no  Notes to clinic:  Unable to refill per protocol, cannot delegate.      Requested Prescriptions  Pending Prescriptions Disp Refills   OLANZapine (ZYPREXA) 5 MG tablet [Pharmacy Med Name: OLANZAPINE 5 MG TABLET] 90 tablet 1    Sig: TAKE 1 TABLET BY MOUTH EVERYDAY AT BEDTIME     Not Delegated - Psychiatry:  Antipsychotics - Second Generation (Atypical) - olanzapine Failed - 05/21/2022  2:49 AM      Failed - This refill cannot be delegated      Failed - Completed PHQ-2 or PHQ-9 in the last 360 days      Failed - Valid encounter within last 6 months    Recent Outpatient Visits           1 year ago Dark red stool   [facility], Cammie Mcgee, MD   1 year ago Other chest pain   [facility]ulogio Bear, NP   1 year ago Other specified hypothyroidism   [place] [facility], Cammie Mcgee, MD   1 year ago Severe major depression without psychotic features ([place])   [place] Pickard, Cammie Mcgee, MD   1 year ago ADD (attention deficit disorder) without hyperactivity   [place] Pick[facility], Cammie Mcgee, MD              Failed - Lipid Panel in normal range within the last 12 months    Cholesterol  Date Value Ref Range Status  04/09/2022 166 <200 mg/dL Final   LDL Cholesterol (Calc)  Date Value Ref Range Status  04/09/2022 88 mg/dL (calc) Final    Comment:    Reference range: <100 . Desirable range <100 mg/dL for primary prevention;   <70 mg/dL for patients with CHD or diabetic patients  with > or = 2 CHD risk factors. Marland Kitchen LDL-C is now calculated using the Martin-Hopkins  calculation, which is a validated novel method providing  better accuracy than the Friedewald equation in the  estimation of LDL-C.  Cresenciano Genre et  al. Annamaria Helling. [id number]): 2061-2068  (http://education.[facility].com/faq/FAQ164)    HDL  Date Value Ref Range Status  04/09/2022 63 > OR = 40 mg/dL Final   Triglycerides  Date Value Ref Range Status  04/09/2022 61 <150 mg/dL Final         Failed - CMP within normal limits and completed in the last 12 months    Albumin  Date Value Ref Range Status  06/27/2016 4.5 3.6 - 5.1 g/dL Final   Alkaline Phosphatase  Date Value Ref Range Status  06/27/2016 35 (L) 40 - 115 U/L Final   Alkaline phosphatase (APISO)  Date Value Ref Range Status  04/09/2022 45 36 - 130 U/L Final   ALT  Date Value Ref Range Status  04/09/2022 39 9 - 46 U/L Final   AST  Date Value Ref Range Status  04/09/2022 20 10 - 40 U/L Final   BUN  Date Value Ref Range Status  04/09/2022 21 7 - 25 mg/dL Final   Calcium  Date Value Ref Range Status  04/09/2022 9.7 8.6 - 10.3 mg/dL Final   CO2  Date Value Ref Range Status  04/09/2022 22 20 - 32 mmol/L Final   Creat  Date Value Ref Range Status  04/09/2022 1.03 0.60 - 1.26 mg/dL Final   Glucose, Bld  Date Value Ref Range Status  04/09/2022 123 (H) 65 - 99 mg/dL Final    Comment:    .            Fasting reference interval . For someone without known diabetes, a glucose value between 100 and 125 mg/dL is consistent with prediabetes and should be confirmed with a follow-up test. .    Potassium  Date Value Ref Range Status  04/09/2022 4.5 3.5 - 5.3 mmol/L Final   Sodium  Date Value Ref Range Status  04/09/2022 139 135 - 146 mmol/L Final   Total Bilirubin  Date Value Ref Range Status  04/09/2022 0.5 0.2 - 1.2 mg/dL Final   Total Protein  Date Value Ref Range Status  04/09/2022 6.9 6.1 - 8.1 g/dL Final   GFR, Est African American  Date Value Ref Range Status  06/06/2020 112 > OR = 60 mL/min/1.35m2 Final   eGFR  Date Value Ref Range Status  04/09/2022 97 > OR = 60 mL/min/1.28m2 Final   GFR, Est Non African American  Date Value  Ref Range Status  06/06/2020 97 > OR = 60 mL/min/1.42m2 Final         Passed - TSH in normal range and within 360 days    TSH  Date Value Ref Range Status  04/09/2022 1.98 0.40 - 4.50 mIU/L Final         Passed - Last BP in normal range    BP Readings from Last 1 Encounters:  04/09/22 118/66         Passed - Last Heart Rate in normal range    Pulse Readings from Last 1 Encounters:  04/09/22 71         Passed - CBC within normal limits and completed in the last 12 months    WBC  Date Value Ref Range Status  04/09/2022 7.3 3.8 - 10.8 Thousand/uL Final   RBC  Date Value Ref Range Status  04/09/2022 4.82 4.20 - 5.80 Million/uL Final   Hemoglobin  Date Value Ref Range Status  04/09/2022 15.0 13.2 - 17.1 g/dL Final   HCT  Date Value Ref Range Status  04/09/2022 43.4 38.5 - 50.0 % Final   MCHC  Date Value Ref Range Status  04/09/2022 34.6 32.0 - 36.0 g/dL Final   [facility]  Date Value Ref Range Status  04/09/2022 31.1 27.0 - 33.0 pg Final   MCV  Date Value Ref Range Status  04/09/2022 90.0 80.0 - 100.0 fL Final   No results found for: "PLTCOUNTKUC", "LABPLAT", "POCPLA" RDW  Date Value Ref Range Status  04/09/2022 13.6 11.0 - 15.0 % Final

## 2022-05-29 DIAGNOSIS — M5416 Radiculopathy, lumbar region: Secondary | ICD-10-CM | POA: Diagnosis not present

## 2022-06-10 ENCOUNTER — Other Ambulatory Visit: Payer: Self-pay | Admitting: Family Medicine

## 2022-06-10 DIAGNOSIS — E038 Other specified hypothyroidism: Secondary | ICD-10-CM

## 2022-06-12 DIAGNOSIS — F332 Major depressive disorder, recurrent severe without psychotic features: Secondary | ICD-10-CM | POA: Diagnosis not present

## 2022-06-12 DIAGNOSIS — M5416 Radiculopathy, lumbar region: Secondary | ICD-10-CM | POA: Diagnosis not present

## 2022-06-18 DIAGNOSIS — M5416 Radiculopathy, lumbar region: Secondary | ICD-10-CM | POA: Diagnosis not present

## 2022-06-26 DIAGNOSIS — M5416 Radiculopathy, lumbar region: Secondary | ICD-10-CM | POA: Diagnosis not present

## 2022-07-03 DIAGNOSIS — M545 Low back pain, unspecified: Secondary | ICD-10-CM | POA: Diagnosis not present

## 2022-07-03 DIAGNOSIS — M5416 Radiculopathy, lumbar region: Secondary | ICD-10-CM | POA: Diagnosis not present

## 2022-07-05 DIAGNOSIS — F900 Attention-deficit hyperactivity disorder, predominantly inattentive type: Secondary | ICD-10-CM | POA: Diagnosis not present

## 2022-07-05 DIAGNOSIS — F410 Panic disorder [episodic paroxysmal anxiety] without agoraphobia: Secondary | ICD-10-CM | POA: Diagnosis not present

## 2022-07-05 DIAGNOSIS — F331 Major depressive disorder, recurrent, moderate: Secondary | ICD-10-CM | POA: Diagnosis not present

## 2022-07-08 DIAGNOSIS — M5416 Radiculopathy, lumbar region: Secondary | ICD-10-CM | POA: Diagnosis not present

## 2022-07-10 ENCOUNTER — Encounter: Payer: Self-pay | Admitting: Family Medicine

## 2022-07-10 DIAGNOSIS — F332 Major depressive disorder, recurrent severe without psychotic features: Secondary | ICD-10-CM | POA: Diagnosis not present

## 2022-07-12 ENCOUNTER — Other Ambulatory Visit: Payer: Self-pay | Admitting: Family Medicine

## 2022-07-12 DIAGNOSIS — E038 Other specified hypothyroidism: Secondary | ICD-10-CM

## 2022-07-15 DIAGNOSIS — J329 Chronic sinusitis, unspecified: Secondary | ICD-10-CM | POA: Diagnosis not present

## 2022-07-15 DIAGNOSIS — J301 Allergic rhinitis due to pollen: Secondary | ICD-10-CM | POA: Diagnosis not present

## 2022-07-17 DIAGNOSIS — M5416 Radiculopathy, lumbar region: Secondary | ICD-10-CM | POA: Diagnosis not present

## 2022-07-17 DIAGNOSIS — F332 Major depressive disorder, recurrent severe without psychotic features: Secondary | ICD-10-CM | POA: Diagnosis not present

## 2022-07-24 DIAGNOSIS — F332 Major depressive disorder, recurrent severe without psychotic features: Secondary | ICD-10-CM | POA: Diagnosis not present

## 2022-07-29 DIAGNOSIS — M5416 Radiculopathy, lumbar region: Secondary | ICD-10-CM | POA: Diagnosis not present

## 2022-08-06 DIAGNOSIS — Z789 Other specified health status: Secondary | ICD-10-CM | POA: Diagnosis not present

## 2022-08-06 DIAGNOSIS — I788 Other diseases of capillaries: Secondary | ICD-10-CM | POA: Diagnosis not present

## 2022-08-06 DIAGNOSIS — R238 Other skin changes: Secondary | ICD-10-CM | POA: Diagnosis not present

## 2022-08-06 DIAGNOSIS — D224 Melanocytic nevi of scalp and neck: Secondary | ICD-10-CM | POA: Diagnosis not present

## 2022-08-06 DIAGNOSIS — R208 Other disturbances of skin sensation: Secondary | ICD-10-CM | POA: Diagnosis not present

## 2022-08-06 DIAGNOSIS — F332 Major depressive disorder, recurrent severe without psychotic features: Secondary | ICD-10-CM | POA: Diagnosis not present

## 2022-08-06 DIAGNOSIS — B078 Other viral warts: Secondary | ICD-10-CM | POA: Diagnosis not present

## 2022-08-07 DIAGNOSIS — M5416 Radiculopathy, lumbar region: Secondary | ICD-10-CM | POA: Diagnosis not present

## 2022-08-21 DIAGNOSIS — F331 Major depressive disorder, recurrent, moderate: Secondary | ICD-10-CM | POA: Diagnosis not present

## 2022-08-21 DIAGNOSIS — F900 Attention-deficit hyperactivity disorder, predominantly inattentive type: Secondary | ICD-10-CM | POA: Diagnosis not present

## 2022-08-21 DIAGNOSIS — F411 Generalized anxiety disorder: Secondary | ICD-10-CM | POA: Diagnosis not present

## 2022-08-26 DIAGNOSIS — F332 Major depressive disorder, recurrent severe without psychotic features: Secondary | ICD-10-CM | POA: Diagnosis not present

## 2022-08-27 DIAGNOSIS — F332 Major depressive disorder, recurrent severe without psychotic features: Secondary | ICD-10-CM | POA: Diagnosis not present

## 2022-08-27 DIAGNOSIS — M5416 Radiculopathy, lumbar region: Secondary | ICD-10-CM | POA: Diagnosis not present

## 2022-09-04 DIAGNOSIS — B349 Viral infection, unspecified: Secondary | ICD-10-CM | POA: Diagnosis not present

## 2022-09-04 DIAGNOSIS — M791 Myalgia, unspecified site: Secondary | ICD-10-CM | POA: Diagnosis not present

## 2022-09-04 DIAGNOSIS — Z20822 Contact with and (suspected) exposure to covid-19: Secondary | ICD-10-CM | POA: Diagnosis not present

## 2022-09-04 DIAGNOSIS — G4489 Other headache syndrome: Secondary | ICD-10-CM | POA: Diagnosis not present

## 2022-09-05 ENCOUNTER — Other Ambulatory Visit (HOSPITAL_BASED_OUTPATIENT_CLINIC_OR_DEPARTMENT_OTHER): Payer: Self-pay

## 2022-09-05 ENCOUNTER — Emergency Department (HOSPITAL_BASED_OUTPATIENT_CLINIC_OR_DEPARTMENT_OTHER): Payer: BC Managed Care – PPO | Admitting: Radiology

## 2022-09-05 ENCOUNTER — Emergency Department (HOSPITAL_BASED_OUTPATIENT_CLINIC_OR_DEPARTMENT_OTHER)
Admission: EM | Admit: 2022-09-05 | Discharge: 2022-09-05 | Disposition: A | Discharge status: Home / Self Care | Payer: BC Managed Care – PPO | Attending: Emergency Medicine | Admitting: Emergency Medicine

## 2022-09-05 ENCOUNTER — Encounter (HOSPITAL_BASED_OUTPATIENT_CLINIC_OR_DEPARTMENT_OTHER): Payer: Self-pay

## 2022-09-05 ENCOUNTER — Encounter (HOSPITAL_BASED_OUTPATIENT_CLINIC_OR_DEPARTMENT_OTHER): Payer: Self-pay | Admitting: Emergency Medicine

## 2022-09-05 ENCOUNTER — Other Ambulatory Visit: Payer: Self-pay

## 2022-09-05 ENCOUNTER — Emergency Department (HOSPITAL_BASED_OUTPATIENT_CLINIC_OR_DEPARTMENT_OTHER)
Admission: EM | Admit: 2022-09-05 | Discharge: 2022-09-05 | Discharge status: Against Medical Advice | Payer: BC Managed Care – PPO | Source: Home / Self Care

## 2022-09-05 DIAGNOSIS — R531 Weakness: Secondary | ICD-10-CM | POA: Insufficient documentation

## 2022-09-05 DIAGNOSIS — J189 Pneumonia, unspecified organism: Secondary | ICD-10-CM | POA: Insufficient documentation

## 2022-09-05 DIAGNOSIS — Z5321 Procedure and treatment not carried out due to patient leaving prior to being seen by health care provider: Secondary | ICD-10-CM | POA: Insufficient documentation

## 2022-09-05 DIAGNOSIS — R918 Other nonspecific abnormal finding of lung field: Secondary | ICD-10-CM | POA: Diagnosis not present

## 2022-09-05 DIAGNOSIS — R509 Fever, unspecified: Secondary | ICD-10-CM | POA: Diagnosis not present

## 2022-09-05 DIAGNOSIS — R52 Pain, unspecified: Secondary | ICD-10-CM | POA: Insufficient documentation

## 2022-09-05 DIAGNOSIS — R0902 Hypoxemia: Secondary | ICD-10-CM | POA: Insufficient documentation

## 2022-09-05 DIAGNOSIS — Z20822 Contact with and (suspected) exposure to covid-19: Secondary | ICD-10-CM | POA: Insufficient documentation

## 2022-09-05 DIAGNOSIS — R059 Cough, unspecified: Secondary | ICD-10-CM | POA: Diagnosis not present

## 2022-09-05 LAB — CBC WITH DIFFERENTIAL/PLATELET
Abs Immature Granulocytes: 0.04 10*3/uL (ref 0.00–0.07)
Basophils Absolute: 0 10*3/uL (ref 0.0–0.1)
Basophils Relative: 0 %
Eosinophils Absolute: 0.1 10*3/uL (ref 0.0–0.5)
Eosinophils Relative: 1 %
HCT: 45.7 % (ref 39.0–52.0)
Hemoglobin: 15.5 g/dL (ref 13.0–17.0)
Immature Granulocytes: 0 %
Lymphocytes Relative: 21 %
Lymphs Abs: 1.9 10*3/uL (ref 0.7–4.0)
MCH: 30.5 pg (ref 26.0–34.0)
MCHC: 33.9 g/dL (ref 30.0–36.0)
MCV: 90 fL (ref 80.0–100.0)
Monocytes Absolute: 0.8 10*3/uL (ref 0.1–1.0)
Monocytes Relative: 9 %
Neutro Abs: 6.3 10*3/uL (ref 1.7–7.7)
Neutrophils Relative %: 69 %
Platelets: 207 10*3/uL (ref 150–400)
RBC: 5.08 MIL/uL (ref 4.22–5.81)
RDW: 12.9 % (ref 11.5–15.5)
WBC: 9.1 10*3/uL (ref 4.0–10.5)
nRBC: 0 % (ref 0.0–0.2)

## 2022-09-05 LAB — BASIC METABOLIC PANEL
Anion gap: 8 (ref 5–15)
BUN: 22 mg/dL — ABNORMAL HIGH (ref 6–20)
CO2: 26 mmol/L (ref 22–32)
Calcium: 9.8 mg/dL (ref 8.9–10.3)
Chloride: 104 mmol/L (ref 98–111)
Creatinine, Ser: 0.99 mg/dL (ref 0.61–1.24)
GFR, Estimated: >60 mL/min (ref >60)
Glucose, Bld: 104 mg/dL — ABNORMAL HIGH (ref 70–99)
Potassium: 4.5 mmol/L (ref 3.5–5.1)
Sodium: 138 mmol/L (ref 135–145)

## 2022-09-05 LAB — RESP PANEL BY RT-PCR (RSV, FLU A&B, COVID)  RVPGX2
Influenza A by PCR: NEGATIVE
Influenza B by PCR: NEGATIVE
Resp Syncytial Virus by PCR: NEGATIVE
SARS Coronavirus 2 by RT PCR: NEGATIVE

## 2022-09-05 MED ORDER — SODIUM CHLORIDE 0.9 % IV BOLUS
1000.0000 mL | Freq: Once | INTRAVENOUS | Status: AC
  Administered 2022-09-05: 1000 mL via INTRAVENOUS

## 2022-09-05 MED ORDER — BENZONATATE 100 MG PO CAPS: 100.0000 mg | ORAL_CAPSULE | Freq: Three times a day (TID) | ORAL | 0 refills | Status: DC

## 2022-09-05 MED ORDER — METHYLPREDNISOLONE 4 MG PO TBPK: ORAL_TABLET | ORAL | 0 refills | Status: DC

## 2022-09-05 MED ORDER — SODIUM CHLORIDE 0.9 % IV SOLN
1.0000 g | Freq: Once | INTRAVENOUS | Status: AC
  Administered 2022-09-05: 1 g via INTRAVENOUS
  Filled 2022-09-05: qty 10

## 2022-09-05 MED ORDER — AZITHROMYCIN 250 MG PO TABS: 250.0000 mg | ORAL_TABLET | Freq: Every day | ORAL | 0 refills | Status: DC

## 2022-09-05 MED ORDER — SODIUM CHLORIDE 0.9 % IV SOLN
500.0000 mg | Freq: Once | INTRAVENOUS | Status: AC
  Administered 2022-09-05: 500 mg via INTRAVENOUS
  Filled 2022-09-05: qty 5

## 2022-09-05 MED ORDER — ACETAMINOPHEN 500 MG PO TABS
1000.0000 mg | ORAL_TABLET | Freq: Once | ORAL | Status: AC
  Administered 2022-09-05: 1000 mg via ORAL
  Filled 2022-09-05: qty 2

## 2022-09-05 NOTE — ED Notes (Signed)
RT walked with Mr. Terry [name] the nurses' station on [place].  His O2 sat dropped to 92% and states that he feels less dizzy.

## 2022-09-05 NOTE — ED Notes (Signed)
RT walked with Terry [name] around the nurses' station on [place].  His O2 sat dropped to 90%.

## 2022-09-05 NOTE — Discharge Instructions (Addendum)
It was a pleasure taking care of you today!  Your chest xray showed concerns for pneumonia today. You will be sent a prescription for Azithromax, take as prescribed and complete the full course of the antibiotic. You will also be sent a prescription for tessalon perles to aid with your cough, take as directed. You will be sent a prescription for Medrol Dospak (steroid), take as directed. Ensure to maintain fluid intake with water, tea, broth, soup, pedialyte, gatorade. You may also use honey to aid with your cough. Call your primary care provider tomorrow to set up a follow up appointment for this week. Return to the Emergency Department if you are experiencing increasing/worsening cough, trouble breathing, pain in chest, fever, or worsening symptoms.

## 2022-09-05 NOTE — ED Triage Notes (Signed)
Pt complaining of feeling weak, aching, having low oxygen. Was diagnosed with pneumonia this am. Has been taking his meds. Not feeling well at all now.

## 2022-09-05 NOTE — ED Triage Notes (Addendum)
Pt arrives to ED with c/o body aches, chills, headache, cough x5 days. Was seen at [facility] yesterday, had labs drawn.

## 2022-09-05 NOTE — ED Provider Notes (Signed)
[place] EMERGENCY DEPARTMENT AT [facility] Provider Note   CSN: [id number] Arrival date & time: 09/05/22  8416     History  Chief Complaint  Patient presents with   Generalized Body Aches    Terry [name] is a 37 y.o. male who presents to the ED with concerns for generalized bodyaches x 5 days.  Sick contacts at home with similar symptoms. Was evaluated at urgent care on yesterday, 7/10. Has assoicated chills, headache, cough.  Has tried over-the-counter medications for symptoms. Denies chest pain, shortness of breath.   The history is provided by the patient. No language interpreter was used.       Home Medications Prior to Admission medications   Medication Sig Start Date End Date Taking? Authorizing Provider  azithromycin (ZITHROMAX) 250 MG tablet Take 1 tablet (250 mg total) by mouth daily. Take first 2 tablets together, then 1 every day until finished. 09/05/22  Yes Robson Trickey A, PA-C  benzonatate (TESSALON) 100 MG capsule Take 1 capsule (100 mg total) by mouth every 8 (eight) hours. 09/05/22  Yes Domnique Vantine A, PA-C  doxycycline (VIBRA-TABS) 100 MG tablet Take 100 mg by mouth 2 (two) times daily. 09/04/22 09/14/22 Yes [provider]  lisdexamfetamine (VYVANSE) 60 MG capsule Take 60 mg by mouth every morning. 08/21/22  Yes [provider]  methylPREDNISolone (MEDROL DOSEPAK) 4 MG TBPK tablet Take as directed 09/05/22  Yes Gleason Ardoin A, PA-C  clonazePAM (KLONOPIN) 0.5 MG tablet Take 1 tablet (0.5 mg total) by mouth 2 (two) times daily as needed for anxiety. 11/07/20   [name], MD  Dextromethorphan-buPROPion ER (AUVELITY) 45-105 MG TBCR Take by mouth.    [provider]  levothyroxine (SYNTHROID) 50 MCG tablet TAKE 1 TABLET BY MOUTH EVERY DAY 07/12/22   [name], MD  lisdexamfetamine (VYVANSE) 50 MG capsule Take 1 capsule (50 mg total) by mouth daily. 01/21/22   [name], MD  lithium carbonate (ESKALITH) 450  MG ER tablet SMARTSIG:1 Tablet(s) By Mouth Every Evening 03/02/22   [provider]  OLANZapine (ZYPREXA) 5 MG tablet TAKE 1 TABLET BY MOUTH EVERYDAY AT BEDTIME 05/21/22   [name], MD  SPRAVATO, 84 MG DOSE, 28 MG/DEVICE SOPK Place into both nostrils. 03/01/21   [provider]  traZODone (DESYREL) 100 MG tablet Take 1 tablet (100 mg total) by mouth at bedtime. 01/28/22   [name], MD  valACYclovir (VALTREX) 1000 MG tablet Take 1 tablet (1,000 mg total) by mouth 2 (two) times daily as needed. 08/23/21   [name], MD      Allergies    Patient has no known allergies.    Review of Systems   Review of Systems  All other systems reviewed and are negative.   Physical Exam Updated Vital Signs BP (!) 122/96 (BP Location: Left Arm)   Pulse 87   Temp 98.5 F (36.9 C) (Oral)   Resp 15   Ht 5\' 11"  (1.803 m)   Wt 90.7 kg   SpO2 94%   BMI 27.89 kg/m  Physical Exam Vitals and nursing note reviewed.  Constitutional:      General: He is not in acute distress.    Appearance: He is not diaphoretic.  HENT:     Head: Normocephalic and atraumatic.     Mouth/Throat:     Pharynx: No oropharyngeal exudate.  Eyes:     General: No scleral icterus.    Conjunctiva/sclera: Conjunctivae normal.  Cardiovascular:  Rate and Rhythm: Normal rate and regular rhythm.     Pulses: Normal pulses.     Heart sounds: Normal heart sounds.  Pulmonary:     Effort: Pulmonary effort is normal. No respiratory distress.     Breath sounds: Normal breath sounds. No wheezing.  Abdominal:     General: Bowel sounds are normal.     Palpations: Abdomen is soft. There is no mass.     Tenderness: There is no abdominal tenderness. There is no guarding or rebound.  Musculoskeletal:        General: Normal range of motion.     Cervical back: Normal range of motion and neck supple.  Skin:    General: Skin is warm and dry.  Neurological:     Mental Status: He is alert.  Psychiatric:         Behavior: Behavior normal.     ED Results / Procedures / Treatments   Labs (all labs ordered are listed, but only abnormal results are displayed) Labs Reviewed  BASIC METABOLIC PANEL - Abnormal; Notable for the following components:      Result Value   Glucose, Bld 104 (*)    BUN 22 (*)    All other components within normal limits  RESP PANEL BY RT-PCR (RSV, FLU A&B, COVID)  [id number]  CBC WITH DIFFERENTIAL/PLATELET    EKG None  Radiology DG Chest 2 View  Result Date: 09/05/2022 CLINICAL DATA:  Fever, body aches, cough EXAM: CHEST - 2 VIEW COMPARISON:  None Available. FINDINGS: No pleural effusion. No pneumothorax. There is a hazy opacity in the left mid lung field, which is worrisome infection. No radiographically apparent displaced rib fractures. Visualized upper unremarkable. Vertebral body heights are maintained IMPRESSION: Hazy opacity in the left mid lung field is worrisome for infection. Electronically Signed   By: Lorenza Cambridge M.D.   On: 09/05/2022 09:03    Procedures Procedures    Medications Ordered in ED Medications  acetaminophen (TYLENOL) tablet 1,000 mg (1,000 mg Oral Given 09/05/22 1018)  cefTRIAXone (ROCEPHIN) 1 g in sodium chloride 0.9 % 100 mL IVPB (0 g Intravenous Stopped 09/05/22 1145)  azithromycin (ZITHROMAX) 500 mg in sodium chloride 0.9 % 250 mL IVPB (0 mg Intravenous Stopped 09/05/22 1250)  sodium chloride 0.9 % bolus 1,000 mL (0 mLs Intravenous Stopped 09/05/22 1158)    ED Course/ Medical Decision Making/ A&P Clinical Course as of 09/05/22 1708  Thu Sep 05, 2022  1022 Discussed with patient lab and imaging findings. Discussed with patient that we will proceed with IVF, labs, and antibiotics. Pt agreeable at this time.  [SB]  1132 Pt re-evaluated and resting comfortably on stretcher. Discussed with patient lab and imaging findings. Discussed with patient treatment plan. Answered all available questions.  [SB]  1249 Patient ambulated after IV fluids  and eating in the ED and.  O2 remained at 92% with improvement of his dizziness. [SB]  1325 In depth conversation and shared decision-making held with patient and family member at bedside regarding plans for admission for observation versus home and trial p.o. antibiotics.  At this time patient opts for being discharged home and being trialed with p.o. antibiotics.  [SB]    Clinical Course User Index [SB] Lennon Richins A, PA-C                             Medical Decision Making Amount and/or Complexity of Data Reviewed Labs: ordered. Radiology: ordered.  Risk OTC drugs. Prescription drug management.   Pt presents with concerns for generalized body aches x 5 days.  Sick contacts at home with similar symptoms.  Patient afebrile, not tachycardic or hypoxic.  On exam patient with no acute cardiovascular, respiratory exam findings.  Differential diagnosis includes COVID, flu, RSV, pneumonia.   Additional history obtained:  Additional history obtained from Parent External records from outside source obtained and reviewed including: Pt was evaluated at Urgent Care on 09/04/22 for similar symptoms. Pt was discharged home with doxycycline prescription.   Labs:  I ordered, and personally interpreted labs.  The pertinent results include:   CBC unremarkable BMP unremarkable Negative COVID, Flu, RSV swab  Imaging: I ordered imaging studies including Chest x-ray I independently visualized and interpreted imaging which showed:  Hazy opacity in the left mid lung field is worrisome for infection.   I agree with the radiologist interpretation  Medications:  I ordered medication including Tylenol, IV fluids, Rocephin, Zithromax for symptom management Reevaluation of the patient after these medicines and interventions, I reevaluated the patient and found that they have improved I have reviewed the patients home medicines and have made adjustments as needed   Disposition: Presenting suspicious  for pneumonia.  Doubt concerns this time for COVID, flu, RSV. After consideration of the diagnostic results and the patients response to treatment, I feel that the patient would benefit from Discharge home.  Patient discharged with a prescription for Zithromax, Medrol Dosepak, Tessalon Perles.  Offered work note, patient declined at this time.  Per patient chart review, patient was evaluated yesterday at urgent care and sent a prescription for doxycycline.  Patient notes that he did not receive the antibiotic and it is not at his pharmacy.  Supportive care measures and strict return precautions discussed with patient at bedside. Pt acknowledges and verbalizes understanding. Pt appears safe for discharge. Follow up as indicated in discharge paperwork.    This chart was dictated using voice recognition software, Dragon. Despite the best efforts of this provider to proofread and correct errors, errors may still occur which can change documentation meaning.  Final Clinical Impression(s) / ED Diagnoses Final diagnoses:  Community acquired pneumonia of left lung, unspecified part of lung    Rx / DC Orders ED Discharge Orders          Ordered    azithromycin (ZITHROMAX) 250 MG tablet  Daily        09/05/22 1338    methylPREDNISolone (MEDROL DOSEPAK) 4 MG TBPK tablet        09/05/22 1338    benzonatate (TESSALON) 100 MG capsule  Every 8 hours        09/05/22 1338              Alan Drummer A, PA-C 09/05/22 1709    Tegeler, Canary Brim, MD 09/06/22 [phone]

## 2022-09-10 DIAGNOSIS — F332 Major depressive disorder, recurrent severe without psychotic features: Secondary | ICD-10-CM | POA: Diagnosis not present

## 2022-09-13 DIAGNOSIS — M5416 Radiculopathy, lumbar region: Secondary | ICD-10-CM | POA: Diagnosis not present

## 2022-09-16 ENCOUNTER — Ambulatory Visit (INDEPENDENT_AMBULATORY_CARE_PROVIDER_SITE_OTHER): Payer: BC Managed Care – PPO | Admitting: Family Medicine

## 2022-09-16 ENCOUNTER — Encounter: Payer: Self-pay | Admitting: Family Medicine

## 2022-09-16 VITALS — BP 120/62 | HR 82 | Temp 97.9°F | Ht 71.0 in | Wt 203.4 lb

## 2022-09-16 DIAGNOSIS — J189 Pneumonia, unspecified organism: Secondary | ICD-10-CM | POA: Diagnosis not present

## 2022-09-16 NOTE — Progress Notes (Signed)
Subjective:    Patient ID: Terry [name], male    DOB: [birth year], 37 y.o.   MRN: [id number]  Recently diagnosed with left upper lobe pneumonia and treated with a z pack.  Clinically, the patient states he is feeling back to normal.  He denies any fever or chills.  He denies any cough.  He denies any pleurisy.  He denies any hemoptysis.  He is 2 weeks out from the pneumonia and he is feeling back to his baseline.  We also discussed testosterone.  Please see the communication through epic.  Patient has a history of depression and is actually recommend treating his testosterone deficiency to help him for depression.  His testosterone level is approaching 400.  I feel that this was in the low end of normal.  I also discussed with the patient potential stop testosterone replacement including prostate cancer, polycythemia, DVT/PE, and the requisite blood work that it would require to monitor. Past Medical History:  Diagnosis Date   Acute appendicitis 02/08/2013   ADHD (attention deficit hyperactivity disorder)    Allergic rhinitis    Allergy    Anxiety    Appendicitis, acute 02/08/2013   Depression    since 8th grade, hospitalized 2015 ([place])   HSV infection    Past Surgical History:  Procedure Laterality Date   APPENDECTOMY     2013   LAPAROSCOPIC APPENDECTOMY N/A 02/08/2013   Procedure: APPENDECTOMY LAPAROSCOPIC;  Surgeon: Velora Heckler, MD;  Location: [facility];  Service: General;  Laterality: N/A;   WISDOM TOOTH EXTRACTION     Current Outpatient Medications on File Prior to Visit  Medication Sig Dispense Refill   clonazePAM (KLONOPIN) 0.5 MG tablet Take 1 tablet (0.5 mg total) by mouth 2 (two) times daily as needed for anxiety. 30 tablet 1   Dextromethorphan-buPROPion ER (AUVELITY) 45-105 MG TBCR Take by mouth.     levothyroxine (SYNTHROID) 50 MCG tablet TAKE 1 TABLET BY MOUTH EVERY DAY 90 tablet 1   lisdexamfetamine (VYVANSE) 50 MG capsule Take 1 capsule (50 mg total) by mouth daily.  30 capsule 0   lisdexamfetamine (VYVANSE) 60 MG capsule Take 60 mg by mouth every morning.     lithium carbonate (ESKALITH) 450 MG ER tablet SMARTSIG:1 Tablet(s) By Mouth Every Evening     methylPREDNISolone (MEDROL DOSEPAK) 4 MG TBPK tablet Take as directed 21 tablet 0   OLANZapine (ZYPREXA) 5 MG tablet TAKE 1 TABLET BY MOUTH EVERYDAY AT BEDTIME 90 tablet 1   SPRAVATO, 84 MG DOSE, 28 MG/DEVICE SOPK Place into both nostrils.     traZODone (DESYREL) 100 MG tablet Take 1 tablet (100 mg total) by mouth at bedtime. 90 tablet 3   valACYclovir (VALTREX) 1000 MG tablet Take 1 tablet (1,000 mg total) by mouth 2 (two) times daily as needed. 30 tablet 0   azithromycin (ZITHROMAX) 250 MG tablet Take 1 tablet (250 mg total) by mouth daily. Take first 2 tablets together, then 1 every day until finished. (Patient not taking: Reported on 09/16/2022) 6 tablet 0   benzonatate (TESSALON) 100 MG capsule Take 1 capsule (100 mg total) by mouth every 8 (eight) hours. (Patient not taking: Reported on 09/16/2022) 21 capsule 0   No current facility-administered medications on file prior to visit.   No Known Allergies Social History   Socioeconomic History   Marital status: Single    Spouse name: Not on file   Number of children: 0   Years of education: Not on file  Highest education level: Bachelor's degree (e.g., BA, AB, BS)  Occupational History   Occupation: works in garden department    Comment: [employer]   Tobacco Use   Smoking status: Former    Current packs/day: 0.00    Average packs/day: 0.5 packs/day for 3.0 years (1.5 ttl pk-yrs)    Types: Cigarettes    Start date: 02/26/2004    Quit date: 02/26/2007    Years since quitting: 15.5   Smokeless tobacco: Never  Vaping Use   Vaping status: Never Used  Substance and Sexual Activity   Alcohol use: Yes    Alcohol/week: 9.0 standard drinks of alcohol    Types: 9 Cans of beer per week    Comment: 2-3   Drug use: Yes    Frequency: 7.0 times per week     Types: Marijuana   Sexual activity: Not on file  Other Topics Concern   Not on file  Social History Narrative   Lives at home alone   Right handed   Caffeine: 1 cup each morning    Social Determinants of Health   Financial Resource Strain: Not on file  Food Insecurity: Not on file  Transportation Needs: Not on file  Physical Activity: Not on file  Stress: Not on file  Social Connections: Unknown (07/09/2021)   Received from [facility], [facility]   Social Network    Social Network: Not on file  Intimate Partner Violence: Unknown (06/01/2021)   Received from [facility], [facility]   HITS    Physically Hurt: Not on file    Insult or Talk Down To: Not on file    Threaten Physical Harm: Not on file    Scream or Curse: Not on file   Family History  Problem Relation Age of Onset   Allergic rhinitis Mother    Cancer Maternal Grandfather        bone and prostate     Review of Systems  All other systems reviewed and are negative.      Objective:   Physical Exam Vitals reviewed.  Constitutional:      General: He is not in acute distress.    Appearance: He is well-developed. He is not diaphoretic.  HENT:     Right Ear: Tympanic membrane and ear canal normal.     Left Ear: Tympanic membrane and ear canal normal.     Nose: No congestion or rhinorrhea.     Mouth/Throat:     Pharynx: No oropharyngeal exudate or posterior oropharyngeal erythema.  Eyes:     Extraocular Movements: Extraocular movements intact.     Conjunctiva/sclera: Conjunctivae normal.     Pupils: Pupils are equal, round, and reactive to light.  Cardiovascular:     Rate and Rhythm: Normal rate and regular rhythm.     Heart sounds: Normal heart sounds. No murmur heard.    No friction rub. No gallop.  Pulmonary:     Effort: Pulmonary effort is normal. No respiratory distress.     Breath sounds: Normal breath sounds. No wheezing or rales.  Chest:     Chest wall: No tenderness.  Abdominal:      General: Bowel sounds are normal. There is no distension.     Palpations: Abdomen is soft. There is no mass.     Tenderness: There is no abdominal tenderness. There is no guarding.  Musculoskeletal:     Right lower leg: No edema.     Left lower leg: No edema.  Skin:  Findings: Erythema and rash present.  Neurological:     General: No focal deficit present.     Mental Status: He is oriented to person, place, and time. Mental status is at baseline.     Motor: No weakness.     Coordination: Coordination normal.     Gait: Gait normal.     Deep Tendon Reflexes: Reflexes normal.  Psychiatric:        Speech: Speech normal.        Behavior: Behavior normal.        Thought Content: Thought content normal. Thought content is not paranoid or delusional. Thought content does not include homicidal or suicidal ideation. Thought content does not include homicidal or suicidal plan.        Judgment: Judgment normal.          Assessment & Plan:  Community acquired pneumonia of left upper lobe of lung - Plan: DG Chest 2 View Clinically, the patient is back to his baseline.  I would like to repeat a chest x-ray in 2 weeks to ensure clinical and radiographic resolution of the pneumonia.  We discussed the pros and cons of testosterone replacement.  I do not feel that his testosterone level is low enough to justify testosterone replacement however I would be willing to prescribe it but the severity of his depression however planed to him risk and benefits of this and the required monitoring.  At the present time, the patient prefers not to take testosterone

## 2022-09-18 DIAGNOSIS — F332 Major depressive disorder, recurrent severe without psychotic features: Secondary | ICD-10-CM | POA: Diagnosis not present

## 2022-09-30 ENCOUNTER — Ambulatory Visit: Admission: RE | Admit: 2022-09-30 | Payer: BC Managed Care – PPO | Source: Ambulatory Visit

## 2022-09-30 DIAGNOSIS — J189 Pneumonia, unspecified organism: Secondary | ICD-10-CM

## 2022-09-30 DIAGNOSIS — Z09 Encounter for follow-up examination after completed treatment for conditions other than malignant neoplasm: Secondary | ICD-10-CM | POA: Diagnosis not present

## 2022-10-07 DIAGNOSIS — M5416 Radiculopathy, lumbar region: Secondary | ICD-10-CM | POA: Diagnosis not present

## 2022-10-08 DIAGNOSIS — F332 Major depressive disorder, recurrent severe without psychotic features: Secondary | ICD-10-CM | POA: Diagnosis not present

## 2022-10-14 DIAGNOSIS — F900 Attention-deficit hyperactivity disorder, predominantly inattentive type: Secondary | ICD-10-CM | POA: Diagnosis not present

## 2022-10-14 DIAGNOSIS — F332 Major depressive disorder, recurrent severe without psychotic features: Secondary | ICD-10-CM | POA: Diagnosis not present

## 2022-10-14 DIAGNOSIS — F411 Generalized anxiety disorder: Secondary | ICD-10-CM | POA: Diagnosis not present

## 2022-10-18 ENCOUNTER — Ambulatory Visit (INDEPENDENT_AMBULATORY_CARE_PROVIDER_SITE_OTHER): Payer: BC Managed Care – PPO | Admitting: Family Medicine

## 2022-10-18 VITALS — BP 124/72 | HR 79 | Temp 98.5°F | Ht 71.0 in | Wt 203.0 lb

## 2022-10-18 DIAGNOSIS — R222 Localized swelling, mass and lump, trunk: Secondary | ICD-10-CM | POA: Diagnosis not present

## 2022-10-18 NOTE — Progress Notes (Signed)
Subjective:    Patient ID: Terry [name], male    DOB: 1985/03/01, 37 y.o.   MRN: [id number]  For the last 2 months, the patient has noticed a nodular lesion in his left mid abdomen.  It is to the left of the midline.  It is located in the subcutaneous fat.  It is roughly 1 estimated to the major status about the size and texture of a lima bean.  It is freely mobile.  It is nontender.  It is not firm.  However it does not have the consistency of the cyst.  Patient states that is not changing. Past Medical History:  Diagnosis Date   Acute appendicitis 02/08/2013   ADHD (attention deficit hyperactivity disorder)    Allergic rhinitis    Allergy    Anxiety    Appendicitis, acute 02/08/2013   Depression    since 8th grade, hospitalized 2015 ([place])   HSV infection    Past Surgical History:  Procedure Laterality Date   APPENDECTOMY     2013   LAPAROSCOPIC APPENDECTOMY N/A 02/08/2013   Procedure: APPENDECTOMY LAPAROSCOPIC;  Surgeon: Velora Heckler, MD;  Location: [facility];  Service: General;  Laterality: N/A;   WISDOM TOOTH EXTRACTION     Current Outpatient Medications on File Prior to Visit  Medication Sig Dispense Refill   clonazePAM (KLONOPIN) 0.5 MG tablet Take 1 tablet (0.5 mg total) by mouth 2 (two) times daily as needed for anxiety. 30 tablet 1   Dextromethorphan-buPROPion ER (AUVELITY) 45-105 MG TBCR Take by mouth.     levothyroxine (SYNTHROID) 50 MCG tablet TAKE 1 TABLET BY MOUTH EVERY DAY 90 tablet 1   lisdexamfetamine (VYVANSE) 60 MG capsule Take 60 mg by mouth every morning.     lithium carbonate (ESKALITH) 450 MG ER tablet SMARTSIG:1 Tablet(s) By Mouth Every Evening     OLANZapine (ZYPREXA) 5 MG tablet TAKE 1 TABLET BY MOUTH EVERYDAY AT BEDTIME 90 tablet 1   SPRAVATO, 84 MG DOSE, 28 MG/DEVICE SOPK Place into both nostrils.     traZODone (DESYREL) 100 MG tablet Take 1 tablet (100 mg total) by mouth at bedtime. 90 tablet 3   valACYclovir (VALTREX) 1000 MG tablet Take 1 tablet  (1,000 mg total) by mouth 2 (two) times daily as needed. 30 tablet 0   Vilazodone HCl (VIIBRYD) 10 MG TABS Take 10 mg by mouth daily.     No current facility-administered medications on file prior to visit.   No Known Allergies Social History   Socioeconomic History   Marital status: Single    Spouse name: Not on file   Number of children: 0   Years of education: Not on file   Highest education level: Bachelor's degree (e.g., BA, AB, BS)  Occupational History   Occupation: works in garden department    Comment: [employer]   Tobacco Use   Smoking status: Former    Current packs/day: 0.00    Average packs/day: 0.5 packs/day for 3.0 years (1.5 ttl pk-yrs)    Types: Cigarettes    Start date: 02/26/2004    Quit date: 02/26/2007    Years since quitting: 15.6   Smokeless tobacco: Never  Vaping Use   Vaping status: Never Used  Substance and Sexual Activity   Alcohol use: Yes    Alcohol/week: 9.0 standard drinks of alcohol    Types: 9 Cans of beer per week    Comment: 2-3   Drug use: Yes    Frequency: 7.0 times per week  Types: Marijuana   Sexual activity: Not on file  Other Topics Concern   Not on file  Social History Narrative   Lives at home alone   Right handed   Caffeine: 1 cup each morning    Social Determinants of Health   Financial Resource Strain: Not on file  Food Insecurity: Not on file  Transportation Needs: Not on file  Physical Activity: Not on file  Stress: Not on file  Social Connections: Unknown (07/09/2021)   Received [facility], [facility]   Social Network    Social Network: Not on file  Intimate Partner Violence: Unknown (06/01/2021)   Received from [facility], [facility]   HITS    Physically Hurt: Not on file    Insult or Talk Down To: Not on file    Threaten Physical Harm: Not on file    Scream or Curse: Not on file   Family History  Problem Relation Age of Onset   Allergic rhinitis Mother    Cancer Maternal Grandfather         bone and prostate     Review of Systems  All other systems reviewed and are negative.      Objective:   Physical Exam Vitals reviewed.  Constitutional:      General: He is not in acute distress.    Appearance: He is well-developed. He is not diaphoretic.  Cardiovascular:     Rate and Rhythm: Normal rate and regular rhythm.     Heart sounds: Normal heart sounds. No murmur heard.    No friction rub. No gallop.  Pulmonary:     Effort: Pulmonary effort is normal. No respiratory distress.     Breath sounds: Normal breath sounds. No wheezing or rales.  Chest:     Chest wall: No tenderness.  Abdominal:     General: Bowel sounds are normal.     Palpations: Abdomen is soft. There is mass.     Tenderness: There is no abdominal tenderness. There is no guarding or rebound.             Assessment & Plan:  Subcutaneous mass of abdominal wall I believe like this is a lipoma versus a cyst.  It seems stable.  The only way to be sure would be to biopsy it.  However I feel confident that this is a benign mass.  Patient is comfortable just watching the lesion at the present time.  If it starts to grow, we can have the patient see surgery for an excisional biopsy or I would be willing to try to perform an excisional biopsy here in the clinic.  Patient is comfortable watching it for now.

## 2022-10-23 DIAGNOSIS — F332 Major depressive disorder, recurrent severe without psychotic features: Secondary | ICD-10-CM | POA: Diagnosis not present

## 2022-11-05 DIAGNOSIS — F332 Major depressive disorder, recurrent severe without psychotic features: Secondary | ICD-10-CM | POA: Diagnosis not present

## 2022-11-06 DIAGNOSIS — Z130 Encounter for screening for diseases of the blood and blood-forming organs and certain disorders involving the immune mechanism: Secondary | ICD-10-CM | POA: Diagnosis not present

## 2022-11-06 DIAGNOSIS — Z79899 Other long term (current) drug therapy: Secondary | ICD-10-CM | POA: Diagnosis not present

## 2022-11-06 DIAGNOSIS — Z1329 Encounter for screening for other suspected endocrine disorder: Secondary | ICD-10-CM | POA: Diagnosis not present

## 2022-11-16 ENCOUNTER — Other Ambulatory Visit: Payer: Self-pay | Admitting: Family Medicine

## 2022-11-16 DIAGNOSIS — E038 Other specified hypothyroidism: Secondary | ICD-10-CM

## 2022-11-18 NOTE — Telephone Encounter (Signed)
Requested Prescriptions  Pending Prescriptions Disp Refills   OLANZapine (ZYPREXA) 5 MG tablet [Pharmacy Med Name: OLANZAPINE 5 MG TABLET] 90 tablet 1    Sig: TAKE 1 TABLET BY MOUTH EVERYDAY AT BEDTIME     Not Delegated - Psychiatry:  Antipsychotics - Second Generation (Atypical) - olanzapine Failed - 11/16/2022  1:17 AM      Failed - This refill cannot be delegated      Failed - Valid encounter within last 6 months    Recent Outpatient Visits           1 year ago Dark red stool   [facility] Pickard, Priscille Heidelberg, MD   1 year ago Other chest pain   [facility] Valentino Nose, NP   1 year ago Other specified hypothyroidism   [facility] [name], MD   2 years ago Severe major depression without psychotic features (HCC)   [facility] Pickard, Priscille Heidelberg, MD   2 years ago ADD (attention deficit disorder) without hyperactivity   [facility] Pickard, Priscille Heidelberg, MD              Failed - Lipid Panel in normal range within the last 12 months    Cholesterol  Date Value Ref Range Status  04/09/2022 166 <200 mg/dL Final   LDL Cholesterol (Calc)  Date Value Ref Range Status  04/09/2022 88 mg/dL (calc) Final    Comment:    Reference range: <100 . Desirable range <100 mg/dL for primary prevention;   <70 mg/dL for patients with CHD or diabetic patients  with > or = 2 CHD risk factors. Marland Kitchen LDL-C is now calculated using the Martin-Hopkins  calculation, which is a validated novel method providing  better accuracy than the Friedewald equation in the  estimation of LDL-C.  Horald Pollen et al. Lenox Ahr. 4401;027(25): 2061-2068  (http://education.[facility].com/faq/FAQ164)    HDL  Date Value Ref Range Status  04/09/2022 63 > OR = 40 mg/dL Final   Triglycerides  Date Value Ref Range Status  04/09/2022 61 <150 mg/dL Final         Failed - CMP within normal limits and completed in the last 12  months    Albumin  Date Value Ref Range Status  06/27/2016 4.5 3.6 - 5.1 g/dL Final   Alkaline Phosphatase  Date Value Ref Range Status  06/27/2016 35 (L) 40 - 115 U/L Final   Alkaline phosphatase (APISO)  Date Value Ref Range Status  04/09/2022 45 36 - 130 U/L Final   ALT  Date Value Ref Range Status  04/09/2022 39 9 - 46 U/L Final   AST  Date Value Ref Range Status  04/09/2022 20 10 - 40 U/L Final   BUN  Date Value Ref Range Status  09/05/2022 22 (H) 6 - 20 mg/dL Final   Calcium  Date Value Ref Range Status  09/05/2022 9.8 8.9 - 10.3 mg/dL Final   CO2  Date Value Ref Range Status  09/05/2022 26 22 - 32 mmol/L Final   Creat  Date Value Ref Range Status  04/09/2022 1.03 0.60 - 1.26 mg/dL Final   Creatinine, Ser  Date Value Ref Range Status  09/05/2022 0.99 0.61 - 1.24 mg/dL Final   Glucose, Bld  Date Value Ref Range Status  09/05/2022 104 (H) 70 - 99 mg/dL Final    Comment:    Glucose reference range applies only to samples taken after fasting for  at least 8 hours.   Potassium  Date Value Ref Range Status  09/05/2022 4.5 3.5 - 5.1 mmol/L Final   Sodium  Date Value Ref Range Status  09/05/2022 138 135 - 145 mmol/L Final   Total Bilirubin  Date Value Ref Range Status  04/09/2022 0.5 0.2 - 1.2 mg/dL Final   Total Protein  Date Value Ref Range Status  04/09/2022 6.9 6.1 - 8.1 g/dL Final   GFR, Est African American  Date Value Ref Range Status  06/06/2020 112 > OR = 60 mL/min/1.62m2 Final   eGFR  Date Value Ref Range Status  04/09/2022 97 > OR = 60 mL/min/1.50m2 Final   GFR, Est Non African American  Date Value Ref Range Status  06/06/2020 97 > OR = 60 mL/min/1.37m2 Final   GFR, Estimated  Date Value Ref Range Status  09/05/2022 >60 >60 mL/min Final    Comment:    (NOTE) Calculated using the CKD-EPI Creatinine Equation (2021)          Passed - TSH in normal range and within 360 days    TSH  Date Value Ref Range Status  04/09/2022 1.98  0.40 - 4.50 mIU/L Final         Passed - Completed PHQ-2 or PHQ-9 in the last 360 days      Passed - Last BP in normal range    BP Readings from Last 1 Encounters:  10/18/22 124/72         Passed - Last Heart Rate in normal range    Pulse Readings from Last 1 Encounters:  10/18/22 79         Passed - CBC within normal limits and completed in the last 12 months    WBC  Date Value Ref Range Status  09/05/2022 9.1 4.0 - 10.5 K/uL Final   RBC  Date Value Ref Range Status  09/05/2022 5.08 4.22 - 5.81 MIL/uL Final   Hemoglobin  Date Value Ref Range Status  09/05/2022 15.5 13.0 - 17.0 g/dL Final   HCT  Date Value Ref Range Status  09/05/2022 45.7 39.0 - 52.0 % Final   MCHC  Date Value Ref Range Status  09/05/2022 33.9 30.0 - 36.0 g/dL Final   [facility]  Date Value Ref Range Status  09/05/2022 30.5 26.0 - 34.0 pg Final   MCV  Date Value Ref Range Status  09/05/2022 90.0 80.0 - 100.0 fL Final   No results found for: "PLTCOUNTKUC", "LABPLAT", "POCPLA" RDW  Date Value Ref Range Status  09/05/2022 12.9 11.5 - 15.5 % Final         Refused Prescriptions Disp Refills   levothyroxine (SYNTHROID) 50 MCG tablet [Pharmacy Med Name: LEVOTHYROXINE 50 MCG TABLET] 90 tablet 1    Sig: TAKE 1 TABLET BY MOUTH EVERY DAY     Endocrinology:  Hypothyroid Agents Failed - 11/16/2022  1:17 AM      Failed - Valid encounter within last 12 months    Recent Outpatient Visits           1 year ago Dark red stool   [facility] Pick[facility], Priscille Heidelberg, MD   1 year ago Other chest pain   [facility] Valentino Nose, NP   1 year ago Other specified hypothyroidism   [facility] [name], MD   2 years ago Severe major depression without psychotic features (HCC)   [facility] Pickard, Priscille Heidelberg, MD   2  years ago ADD (attention deficit disorder) without hyperactivity   [facility] Pickard, Priscille Heidelberg, MD               Passed - TSH in normal range and within 360 days    TSH  Date Value Ref Range Status  04/09/2022 1.98 0.40 - 4.50 mIU/L Final

## 2022-11-18 NOTE — Telephone Encounter (Signed)
Requested medications are due for refill today.  yes  Requested medications are on the active medications list.  yes  Last refill. 05/21/2022 #90 1 rf  Future visit scheduled.   no  Notes to clinic.  Refill not delegated.    Requested Prescriptions  Pending Prescriptions Disp Refills   OLANZapine (ZYPREXA) 5 MG tablet [Pharmacy Med Name: OLANZAPINE 5 MG TABLET] 90 tablet 1    Sig: TAKE 1 TABLET BY MOUTH EVERYDAY AT BEDTIME     Not Delegated - Psychiatry:  Antipsychotics - Second Generation (Atypical) - olanzapine Failed - 11/16/2022  1:17 AM      Failed - This refill cannot be delegated      Failed - Valid encounter within last 6 months    Recent Outpatient Visits           1 year ago Dark red stool   [facility] Pickard, Priscille Heidelberg, MD   1 year ago Other chest pain   [facility] Valentino Nose, NP   1 year ago Other specified hypothyroidism   [facility] [name], MD   2 years ago Severe major depression without psychotic features (HCC)   [facility] Pickard, Priscille Heidelberg, MD   2 years ago ADD (attention deficit disorder) without hyperactivity   [facility] Pickard, Priscille Heidelberg, MD              Failed - Lipid Panel in normal range within the last 12 months    Cholesterol  Date Value Ref Range Status  04/09/2022 166 <200 mg/dL Final   LDL Cholesterol (Calc)  Date Value Ref Range Status  04/09/2022 88 mg/dL (calc) Final    Comment:    Reference range: <100 . Desirable range <100 mg/dL for primary prevention;   <70 mg/dL for patients with CHD or diabetic patients  with > or = 2 CHD risk factors. Marland Kitchen LDL-C is now calculated using the Martin-Hopkins  calculation, which is a validated novel method providing  better accuracy than the Friedewald equation in the  estimation of LDL-C.  Horald Pollen et al. Lenox Ahr. 5284;132(44): 2061-2068   (http://education.[facility].com/faq/FAQ164)    HDL  Date Value Ref Range Status  04/09/2022 63 > OR = 40 mg/dL Final   Triglycerides  Date Value Ref Range Status  04/09/2022 61 <150 mg/dL Final         Failed - CMP within normal limits and completed in the last 12 months    Albumin  Date Value Ref Range Status  06/27/2016 4.5 3.6 - 5.1 g/dL Final   Alkaline Phosphatase  Date Value Ref Range Status  06/27/2016 35 (L) 40 - 115 U/L Final   Alkaline phosphatase (APISO)  Date Value Ref Range Status  04/09/2022 45 36 - 130 U/L Final   ALT  Date Value Ref Range Status  04/09/2022 39 9 - 46 U/L Final   AST  Date Value Ref Range Status  04/09/2022 20 10 - 40 U/L Final   BUN  Date Value Ref Range Status  09/05/2022 22 (H) 6 - 20 mg/dL Final   Calcium  Date Value Ref Range Status  09/05/2022 9.8 8.9 - 10.3 mg/dL Final   CO2  Date Value Ref Range Status  09/05/2022 26 22 - 32 mmol/L Final   Creat  Date Value Ref Range Status  04/09/2022 1.03 0.60 - 1.26 mg/dL Final   Creatinine, Ser  Date Value Ref Range Status  09/05/2022 0.99 0.61 - 1.24 mg/dL Final   Glucose, Bld  Date Value Ref Range Status  09/05/2022 104 (H) 70 - 99 mg/dL Final    Comment:    Glucose reference range applies only to samples taken after fasting for at least 8 hours.   Potassium  Date Value Ref Range Status  09/05/2022 4.5 3.5 - 5.1 mmol/L Final   Sodium  Date Value Ref Range Status  09/05/2022 138 135 - 145 mmol/L Final   Total Bilirubin  Date Value Ref Range Status  04/09/2022 0.5 0.2 - 1.2 mg/dL Final   Total Protein  Date Value Ref Range Status  04/09/2022 6.9 6.1 - 8.1 g/dL Final   GFR, Est African American  Date Value Ref Range Status  06/06/2020 112 > OR = 60 mL/min/1.85m2 Final   eGFR  Date Value Ref Range Status  04/09/2022 97 > OR = 60 mL/min/1.73m2 Final   GFR, Est Non African American  Date Value Ref Range Status  06/06/2020 97 > OR = 60 mL/min/1.13m2  Final   GFR, Estimated  Date Value Ref Range Status  09/05/2022 >60 >60 mL/min Final    Comment:    (NOTE) Calculated using the CKD-EPI Creatinine Equation (2021)          Passed - TSH in normal range and within 360 days    TSH  Date Value Ref Range Status  04/09/2022 1.98 0.40 - 4.50 mIU/L Final         Passed - Completed PHQ-2 or PHQ-9 in the last 360 days      Passed - Last BP in normal range    BP Readings from Last 1 Encounters:  10/18/22 124/72         Passed - Last Heart Rate in normal range    Pulse Readings from Last 1 Encounters:  10/18/22 79         Passed - CBC within normal limits and completed in the last 12 months    WBC  Date Value Ref Range Status  09/05/2022 9.1 4.0 - 10.5 K/uL Final   RBC  Date Value Ref Range Status  09/05/2022 5.08 4.22 - 5.81 MIL/uL Final   Hemoglobin  Date Value Ref Range Status  09/05/2022 15.5 13.0 - 17.0 g/dL Final   HCT  Date Value Ref Range Status  09/05/2022 45.7 39.0 - 52.0 % Final   MCHC  Date Value Ref Range Status  09/05/2022 33.9 30.0 - 36.0 g/dL Final   [facility]  Date Value Ref Range Status  09/05/2022 30.5 26.0 - 34.0 pg Final   MCV  Date Value Ref Range Status  09/05/2022 90.0 80.0 - 100.0 fL Final   No results found for: "PLTCOUNTKUC", "LABPLAT", "POCPLA" RDW  Date Value Ref Range Status  09/05/2022 12.9 11.5 - 15.5 % Final         Refused Prescriptions Disp Refills   levothyroxine (SYNTHROID) 50 MCG tablet [Pharmacy Med Name: LEVOTHYROXINE 50 MCG TABLET] 90 tablet 1    Sig: TAKE 1 TABLET BY MOUTH EVERY DAY     Endocrinology:  Hypothyroid Agents Failed - 11/16/2022  1:17 AM      Failed - Valid encounter within last 12 months    Recent Outpatient Visits           1 year ago Dark red stool   [facility] Pickard, Priscille Heidelberg, MD   1 year ago Other chest pain   [facility] Valentino Nose,  NP   1 year ago Other specified hypothyroidism   [facility]  Medicine [name], MD   2 years ago Severe major depression without psychotic features [facility])   [facility] Pickard, Priscille Heidelberg, MD   2 years ago ADD (attention deficit disorder) without hyperactivity   [facility] Pickard, Priscille Heidelberg, MD              Passed - TSH in normal range and within 360 days    TSH  Date Value Ref Range Status  04/09/2022 1.98 0.40 - 4.50 mIU/L Final

## 2022-11-19 DIAGNOSIS — F332 Major depressive disorder, recurrent severe without psychotic features: Secondary | ICD-10-CM | POA: Diagnosis not present

## 2022-11-21 DIAGNOSIS — F332 Major depressive disorder, recurrent severe without psychotic features: Secondary | ICD-10-CM | POA: Diagnosis not present

## 2022-11-21 DIAGNOSIS — F410 Panic disorder [episodic paroxysmal anxiety] without agoraphobia: Secondary | ICD-10-CM | POA: Diagnosis not present

## 2022-11-21 DIAGNOSIS — F900 Attention-deficit hyperactivity disorder, predominantly inattentive type: Secondary | ICD-10-CM | POA: Diagnosis not present

## 2022-11-22 ENCOUNTER — Other Ambulatory Visit: Payer: Self-pay

## 2022-11-22 DIAGNOSIS — S51011A Laceration without foreign body of right elbow, initial encounter: Secondary | ICD-10-CM | POA: Insufficient documentation

## 2022-11-22 DIAGNOSIS — S51012A Laceration without foreign body of left elbow, initial encounter: Secondary | ICD-10-CM | POA: Insufficient documentation

## 2022-11-22 DIAGNOSIS — W19XXXA Unspecified fall, initial encounter: Secondary | ICD-10-CM | POA: Insufficient documentation

## 2022-11-22 DIAGNOSIS — S59902A Unspecified injury of left elbow, initial encounter: Secondary | ICD-10-CM | POA: Diagnosis not present

## 2022-11-22 NOTE — ED Triage Notes (Signed)
Pt to triage c/o of laceration to left elbow resulting from GLF into glass coffee table. Pt presents with 6 cm laceration to posterior bend of left elbow, bleeding controlled at this time. PT denies LOC or other injuries. VSS NAD PT on room air. Clean bandage applied to laceration.

## 2022-11-23 ENCOUNTER — Emergency Department (HOSPITAL_BASED_OUTPATIENT_CLINIC_OR_DEPARTMENT_OTHER)
Admission: EM | Admit: 2022-11-23 | Discharge: 2022-11-23 | Disposition: A | Discharge status: Home / Self Care | Payer: BC Managed Care – PPO | Attending: Emergency Medicine | Admitting: Emergency Medicine

## 2022-11-23 ENCOUNTER — Emergency Department (HOSPITAL_BASED_OUTPATIENT_CLINIC_OR_DEPARTMENT_OTHER): Payer: BC Managed Care – PPO

## 2022-11-23 DIAGNOSIS — S51012A Laceration without foreign body of left elbow, initial encounter: Secondary | ICD-10-CM

## 2022-11-23 MED ORDER — LIDOCAINE-EPINEPHRINE-TETRACAINE (LET) TOPICAL GEL
3.0000 mL | Freq: Once | TOPICAL | Status: AC
  Administered 2022-11-23: 3 mL via TOPICAL
  Filled 2022-11-23: qty 3

## 2022-11-23 MED ORDER — LIDOCAINE-EPINEPHRINE (PF) 2 %-1:200000 IJ SOLN
20.0000 mL | Freq: Once | INTRAMUSCULAR | Status: AC
  Administered 2022-11-23: 20 mL via INTRADERMAL
  Filled 2022-11-23: qty 20

## 2022-11-23 NOTE — Discharge Instructions (Signed)
Do not let your laceration (cut) get wet for the next 48 hours. After that you may allow soapy water to drain down the wound to clean it.  Please do not scrub.  Do not submerge the wound under water for the next 2 weeks.   To minimize scarring, you can apply a vaseline based ointment for the next 2 weeks and keep it out of direct sun light. After that, you may apply sunscreen for the next several months.    Return if your wound appears to be infected (see laceration care instructions).

## 2022-11-23 NOTE — ED Notes (Signed)
Pt. Has a 2.5-3cm lac to left elbow, edges not approximated, bleeding was controlled by a dressing pt. Had applied pta, but began oozing when I removed the dressing to assess wound.

## 2022-11-23 NOTE — ED Provider Notes (Signed)
EMERGENCY DEPARTMENT AT [facility] Provider Note  CSN: [id number] Arrival date & time: 11/22/22 2347  Chief Complaint(s) Laceration  HPI Terry [name] is a 37 y.o. male patient presented after mechanical fall onto a glass table.  Tetanus up-to-date.  Patient sustained lacerations to bilateral elbows.  Denies any associated pain with it.  Full range of motion.   Laceration   Past Medical History Past Medical History:  Diagnosis Date   Acute appendicitis 02/08/2013   ADHD (attention deficit hyperactivity disorder)    Allergic rhinitis    Allergy    Anxiety    Appendicitis, acute 02/08/2013   Depression    since 8th grade, hospitalized 2015 ([place])   HSV infection    Patient Active Problem List   Diagnosis Date Noted   Spinal stenosis of lumbar region 01/03/2021   Somatic dysfunction of spine, lumbar 08/10/2020   Chronic left-sided low back pain with left-sided sciatica 12/01/2019   Genital herpes simplex 09/24/2018   Overweight (BMI 25.0-29.9) 09/24/2018   Bipolar affective disorder, current episode depressed (HCC) 09/24/2018   Generalized anxiety disorder 09/24/2018   Social anxiety disorder 09/24/2018   Insomnia 09/24/2018   Recurrent major depression resistant to treatment (HCC) 09/24/2018   ADD (attention deficit disorder) without hyperactivity 09/24/2018   Elevated liver enzymes 09/24/2018   Drug-induced erectile dysfunction 09/24/2018   ATTENTION DEFICIT HYPERACTIVITY DISORDER 03/04/2007   Seasonal and perennial allergic rhinitis 03/04/2007   Home Medication(s) Prior to Admission medications   Medication Sig Start Date End Date Taking? Authorizing Provider  clonazePAM (KLONOPIN) 0.5 MG tablet Take 1 tablet (0.5 mg total) by mouth 2 (two) times daily as needed for anxiety. 11/07/20   [name], MD  Dextromethorphan-buPROPion ER (AUVELITY) 45-105 MG TBCR Take by mouth.    [provider]  levothyroxine (SYNTHROID) 50 MCG  tablet TAKE 1 TABLET BY MOUTH EVERY DAY 07/12/22   [name], MD  lisdexamfetamine (VYVANSE) 60 MG capsule Take 60 mg by mouth every morning. 08/21/22   [provider]  lithium carbonate (ESKALITH) 450 MG ER tablet SMARTSIG:1 Tablet(s) By Mouth Every Evening 03/02/22   [provider]  OLANZapine (ZYPREXA) 5 MG tablet TAKE 1 TABLET BY MOUTH EVERYDAY AT BEDTIME 05/21/22   [name], MD  SPRAVATO, 84 MG DOSE, 28 MG/DEVICE SOPK Place into both nostrils. 03/01/21   [provider]  traZODone (DESYREL) 100 MG tablet Take 1 tablet (100 mg total) by mouth at bedtime. 01/28/22   [name], MD  valACYclovir (VALTREX) 1000 MG tablet Take 1 tablet (1,000 mg total) by mouth 2 (two) times daily as needed. 08/23/21   [name], MD  Vilazodone HCl (VIIBRYD) 10 MG TABS Take 10 mg by mouth daily. 10/15/22   [provider]  Allergies Patient has no known allergies.  Review of Systems Review of Systems As noted in HPI  Physical Exam Vital Signs  I have reviewed the triage vital signs BP (!) 134/94   Pulse 61   Temp 97.9 F (36.6 C)   Resp 20   Wt 90.7 kg   SpO2 97%   BMI 27.89 kg/m   Physical Exam Vitals reviewed.  Constitutional:      General: He is not in acute distress.    Appearance: He is well-developed. He is not diaphoretic.  HENT:     Head: Normocephalic and atraumatic.     Right Ear: External ear normal.     Left Ear: External ear normal.     Nose: Nose normal.     Mouth/Throat:     Mouth: Mucous membranes are moist.  Eyes:     General: No scleral icterus.    Conjunctiva/sclera: Conjunctivae normal.  Neck:     Trachea: Phonation normal.  Cardiovascular:     Rate and Rhythm: Normal rate and regular rhythm.  Pulmonary:     Effort: Pulmonary effort is normal. No respiratory distress.      Breath sounds: No stridor.  Abdominal:     General: There is no distension.  Musculoskeletal:        General: Normal range of motion.     Right elbow: Laceration (numerous superficial laceration measuring <60mm. well approximated) present. No swelling or deformity. Normal range of motion. No tenderness.     Left elbow: Laceration present. No swelling or deformity. Normal range of motion. No tenderness.       Arms:     Cervical back: Normal range of motion.  Neurological:     Mental Status: He is alert and oriented to person, place, and time.  Psychiatric:        Behavior: Behavior normal.     ED Results and Treatments Labs (all labs ordered are listed, but only abnormal results are displayed) Labs Reviewed - No data to display                                                                                                                       EKG  EKG Interpretation Date/Time:    Ventricular Rate:    PR Interval:    QRS Duration:    QT Interval:    QTC Calculation:   R Axis:      Text Interpretation:         Radiology DG Elbow 2 Views Left  Result Date: 11/23/2022 CLINICAL DATA:  Laceration to the left elbow after a fall onto glass coffee table. EXAM: LEFT ELBOW - 2 VIEW COMPARISON:  None Available. FINDINGS: There is no evidence of fracture, dislocation, or joint effusion. There is no evidence of arthropathy or other focal bone abnormality. Soft tissues are unremarkable. IMPRESSION: Negative. Electronically Signed   By: Burman Nieves M.D.   On: 11/23/2022 03:09    Medications Ordered in ED Medications  lidocaine-EPINEPHrine (  XYLOCAINE W/EPI) 2 %-1:200000 (PF) injection 20 mL (20 mLs Intradermal Given 11/23/22 0400)  lidocaine-EPINEPHrine-tetracaine (LET) topical gel (3 mLs Topical Given 11/23/22 0400)   Procedures .Marland KitchenLaceration Repair  Date/Time: 11/23/2022 5:13 AM  Performed by: Nira Conn, MD Authorized by: Nira Conn, MD   Consent:     Consent obtained:  Verbal   Consent given by:  Patient   Risks discussed:  Infection, pain and retained foreign body Universal protocol:    Patient identity confirmed:  Arm band Anesthesia:    Anesthesia method:  Topical application and local infiltration   Topical anesthetic:  LET   Local anesthetic:  Lidocaine 2% WITH epi Laceration details:    Location:  Shoulder/arm   Shoulder/arm location:  L elbow   Length (cm):  2   Depth (mm):  4 Pre-procedure details:    Preparation:  Patient was prepped and draped in usual sterile fashion and imaging obtained to evaluate for foreign bodies Exploration:    Hemostasis achieved with:  Direct pressure   Imaging obtained: x-ray     Imaging outcome: foreign body not noted     Wound exploration: wound explored through full range of motion     Wound extent: no foreign body   Treatment:    Area cleansed with:  Povidone-iodine and saline   Amount of cleaning:  Standard   Irrigation solution:  Sterile saline   Irrigation method:  Pressure wash Skin repair:    Repair method:  Sutures   Suture size:  3-0   Suture material:  Nylon   Suture technique:  Horizontal mattress   Number of sutures:  2 Approximation:    Approximation:  Close Repair type:    Repair type:  Simple Post-procedure details:    Dressing:  Non-adherent dressing   Procedure completion:  Tolerated .Marland KitchenLaceration Repair  Date/Time: 11/23/2022 5:14 AM  Performed by: Nira Conn, MD Authorized by: Nira Conn, MD   Laceration details:    Length (cm):  1   Depth (mm):  2 Treatment:    Area cleansed with:  Povidone-iodine   Amount of cleaning:  Standard   Irrigation solution:  Sterile saline   Irrigation method:  Pressure wash Skin repair:    Repair method:  Sutures   Suture size:  3-0   Suture material:  Nylon   Suture technique:  Horizontal mattress   Number of sutures:  1 Approximation:    Approximation:  Close Repair type:    Repair type:   Simple Post-procedure details:    Dressing:  Non-adherent dressing   Procedure completion:  Tolerated .Marland KitchenLaceration Repair  Date/Time: 11/23/2022 5:15 AM  Performed by: Nira Conn, MD Authorized by: Nira Conn, MD   Laceration details:    Length (cm):  0.7   Depth (mm):  2 Treatment:    Area cleansed with:  Povidone-iodine   Amount of cleaning:  Standard   Irrigation solution:  Sterile saline   Irrigation method:  Pressure wash Skin repair:    Repair method:  Sutures   Suture size:  3-0   Suture material:  Nylon   Suture technique:  Simple interrupted   Number of sutures:  1 Approximation:    Approximation:  Close Repair type:    Repair type:  Simple Post-procedure details:    Dressing:  Non-adherent dressing   Procedure completion:  Tolerated   (including critical care time) Medical Decision Making / ED Course   Medical Decision Making Amount and/or Complexity  of Data Reviewed Radiology: ordered.  Risk Prescription drug management.    Mechanical fall resulting in bilateral elbow lacerations.  All blood 3 lacerations are small and well-approximated.  These do not require primary closure.  The 3 lacerations to the left elbow do require primary closure.  These were thoroughly irrigated and closed as above.  X-ray negative for associated bony injuries.  No obvious retained foreign bodies palpable or on x-ray.  Return for suture removals.    Final Clinical Impression(s) / ED Diagnoses Final diagnoses:  Elbow laceration, left, initial encounter   The patient appears reasonably screened and/or stabilized for discharge and I doubt any other medical condition or other [facility] requiring further screening, evaluation, or treatment in the ED at this time. I have discussed the findings, Dx and Tx plan with the patient/family who expressed understanding and agree(s) with the plan. Discharge instructions discussed at length. The patient/family was given strict  return precautions who verbalized understanding of the instructions. No further questions at time of discharge.  Disposition: Discharge  Condition: Good  ED Discharge Orders     None         Follow Up: [facility] Emergency Department at [facility] [address] [place] [address] [phone] Go to  for suture removal in 10-14 days    This chart was dictated using voice recognition software.  Despite best efforts to proofread,  errors can occur which can change the documentation meaning.    Nira Conn, MD 11/23/22 [phone]

## 2022-12-05 DIAGNOSIS — B360 Pityriasis versicolor: Secondary | ICD-10-CM | POA: Diagnosis not present

## 2022-12-05 DIAGNOSIS — D225 Melanocytic nevi of trunk: Secondary | ICD-10-CM | POA: Diagnosis not present

## 2022-12-05 DIAGNOSIS — L821 Other seborrheic keratosis: Secondary | ICD-10-CM | POA: Diagnosis not present

## 2022-12-05 DIAGNOSIS — L814 Other melanin hyperpigmentation: Secondary | ICD-10-CM | POA: Diagnosis not present

## 2022-12-06 DIAGNOSIS — F332 Major depressive disorder, recurrent severe without psychotic features: Secondary | ICD-10-CM | POA: Diagnosis not present

## 2022-12-09 ENCOUNTER — Encounter: Payer: Self-pay | Admitting: Family Medicine

## 2022-12-09 ENCOUNTER — Ambulatory Visit: Payer: BC Managed Care – PPO | Admitting: Family Medicine

## 2022-12-09 VITALS — BP 138/76 | HR 82 | Temp 99.0°F | Ht 71.0 in | Wt 207.6 lb

## 2022-12-09 DIAGNOSIS — S51002D Unspecified open wound of left elbow, subsequent encounter: Secondary | ICD-10-CM

## 2022-12-09 DIAGNOSIS — Z4802 Encounter for removal of sutures: Secondary | ICD-10-CM

## 2022-12-09 MED ORDER — CEPHALEXIN 500 MG PO CAPS: 500.0000 mg | ORAL_CAPSULE | Freq: Three times a day (TID) | ORAL | 0 refills | Status: DC

## 2022-12-09 NOTE — Progress Notes (Signed)
Subjective:    Patient ID: Terry [name], male    DOB: 1986-01-07, 37 y.o.   MRN: [id number]  Last Saturday, the patient fell through a piece of glass and sustained an injury to his left elbow.  The patient had 4 simple interrupted sutures placed in his left elbow.  He is here today to have the sutures removed.  There were 4 small lacerations each closed individually with separate sutures.  They have all healed completely.  There is no evidence of cellulitis. Past Medical History:  Diagnosis Date  . Acute appendicitis 02/08/2013  . ADHD (attention deficit hyperactivity disorder)   . Allergic rhinitis   . Allergy   . Anxiety   . Appendicitis, acute 02/08/2013  . Depression    since 8th grade, hospitalized 58 ([place])  . HSV infection    Past Surgical History:  Procedure Laterality Date  . APPENDECTOMY     2013  . LAPAROSCOPIC APPENDECTOMY N/A 02/08/2013   Procedure: APPENDECTOMY LAPAROSCOPIC;  Surgeon: Velora Heckler, MD;  Location: [facility] ORS;  Service: General;  Laterality: N/A;  . WISDOM TOOTH EXTRACTION     Current Outpatient Medications on File Prior to Visit  Medication Sig Dispense Refill  . clonazePAM (KLONOPIN) 0.5 MG tablet Take 1 tablet (0.5 mg total) by mouth 2 (two) times daily as needed for anxiety. 30 tablet 1  . Dextromethorphan-buPROPion ER (AUVELITY) 45-105 MG TBCR Take by mouth.    . levothyroxine (SYNTHROID) 50 MCG tablet TAKE 1 TABLET BY MOUTH EVERY DAY 90 tablet 1  . lisdexamfetamine (VYVANSE) 60 MG capsule Take 60 mg by mouth every morning.    . lithium carbonate (ESKALITH) 450 MG ER tablet SMARTSIG:1 Tablet(s) By Mouth Every Evening    . OLANZapine (ZYPREXA) 5 MG tablet TAKE 1 TABLET BY MOUTH EVERYDAY AT BEDTIME 90 tablet 1  . SPRAVATO, 84 MG DOSE, 28 MG/DEVICE SOPK Place into both nostrils.    . traZODone (DESYREL) 100 MG tablet Take 1 tablet (100 mg total) by mouth at bedtime. 90 tablet 3  . valACYclovir (VALTREX) 1000 MG tablet Take 1 tablet (1,000 mg  total) by mouth 2 (two) times daily as needed. 30 tablet 0  . Vilazodone HCl (VIIBRYD) 10 MG TABS Take 10 mg by mouth daily.     No current facility-administered medications on file prior to visit.   No Known Allergies Social History   Socioeconomic History  . Marital status: Single    Spouse name: Not on file  . Number of children: 0  . Years of education: Not on file  . Highest education level: Bachelor's degree (e.g., BA, AB, BS)  Occupational History  . Occupation: works in garden department    Comment: [employer]   Tobacco Use  . Smoking status: Former    Current packs/day: 0.00    Average packs/day: 0.5 packs/day for 3.0 years (1.5 ttl pk-yrs)    Types: Cigarettes    Start date: 02/26/2004    Quit date: 02/26/2007    Years since quitting: 15.7  . Smokeless tobacco: Never  Vaping Use  . Vaping status: Never Used  Substance and Sexual Activity  . Alcohol use: Yes    Alcohol/week: 9.0 standard drinks of alcohol    Types: 9 Cans of beer per week    Comment: 2-3  . Drug use: Yes    Frequency: 7.0 times per week    Types: Marijuana  . Sexual activity: Not on file  Other Topics Concern  . Not  on file  Social History Narrative   Lives at home alone   Right handed   Caffeine: 1 cup each morning    Social Determinants of Health   Financial Resource Strain: Not on file  Food Insecurity: Not on file  Transportation Needs: Not on file  Physical Activity: Not on file  Stress: Not on file  Social Connections: Unknown (07/09/2021)   [facility]   Social Network   . Social Network: Not on file  Intimate Partner Violence: Unknown (06/01/2021)   [facility] from [facility]   HITS   . Physically Hurt: Not on file   . Insult or Talk Down To: Not on file   . Threaten Physical Harm: Not on file   . Scream or Curse: Not on file   Family History  Problem Relation Age of Onset  . Allergic rhinitis Mother   . Cancer Maternal Grandfather         bone and prostate     Review of Systems  All other systems reviewed and are negative.      Objective:   Physical Exam Vitals reviewed.  Constitutional:      General: He is not in acute distress.    Appearance: He is well-developed. He is not diaphoretic.  Cardiovascular:     Rate and Rhythm: Normal rate and regular rhythm.     Heart sounds: Normal heart sounds. No murmur heard.    No friction rub. No gallop.  Pulmonary:     Effort: Pulmonary effort is normal. No respiratory distress.     Breath sounds: Normal breath sounds. No wheezing or rales.  Chest:     Chest wall: No tenderness.  Musculoskeletal:     Left elbow: No swelling, deformity or effusion. Normal range of motion. No tenderness.          Assessment & Plan:  Visit for suture removal 4 sutures removed without difficulty

## 2022-12-13 DIAGNOSIS — F332 Major depressive disorder, recurrent severe without psychotic features: Secondary | ICD-10-CM | POA: Diagnosis not present

## 2022-12-18 DIAGNOSIS — F332 Major depressive disorder, recurrent severe without psychotic features: Secondary | ICD-10-CM | POA: Diagnosis not present

## 2022-12-26 DIAGNOSIS — E291 Testicular hypofunction: Secondary | ICD-10-CM | POA: Diagnosis not present

## 2022-12-27 ENCOUNTER — Telehealth: Payer: Self-pay | Admitting: *Deleted

## 2022-12-27 NOTE — Telephone Encounter (Signed)
Transition Care Management Unsuccessful Follow-up Telephone Call  Date of discharge and from where:  [facility]  11/23/2022  Attempts:  1st Attempt  Reason for unsuccessful TCM follow-up call:  Left voice message

## 2022-12-30 ENCOUNTER — Telehealth: Payer: Self-pay | Admitting: *Deleted

## 2022-12-30 NOTE — Telephone Encounter (Signed)
Transition Care Management Unsuccessful Follow-up Telephone Call  Date of discharge and from where:  [facility]  11/23/2022  Attempts:  2nd Attempt  Reason for unsuccessful TCM follow-up call:  Left voice message

## 2022-12-31 ENCOUNTER — Other Ambulatory Visit: Payer: Self-pay | Admitting: Family Medicine

## 2022-12-31 DIAGNOSIS — E038 Other specified hypothyroidism: Secondary | ICD-10-CM

## 2023-01-01 DIAGNOSIS — F332 Major depressive disorder, recurrent severe without psychotic features: Secondary | ICD-10-CM | POA: Diagnosis not present

## 2023-01-01 NOTE — Telephone Encounter (Signed)
Requested Prescriptions  Pending Prescriptions Disp Refills   levothyroxine (SYNTHROID) 50 MCG tablet [Pharmacy Med Name: LEVOTHYROXINE 50 MCG TABLET] 90 tablet 0    Sig: TAKE 1 TABLET BY MOUTH EVERY DAY     Endocrinology:  Hypothyroid Agents Failed - 12/31/2022  9:41 AM      Failed - Valid encounter within last 12 months    Recent Outpatient Visits           1 year ago Dark red stool   [facility] Pickard, Priscille Heidelberg, MD   1 year ago Other chest pain   [facility] Valentino Nose, NP   2 years ago Other specified hypothyroidism   [facility] [name], MD   2 years ago Severe major depression without psychotic features [facility])   [facility] Pickard, Priscille Heidelberg, MD   2 years ago ADD (attention deficit disorder) without hyperactivity   [facility] Medicine Pickard, Priscille Heidelberg, MD              Passed - TSH in normal range and within 360 days    TSH  Date Value Ref Range Status  04/09/2022 1.98 0.40 - 4.50 mIU/L Final

## 2023-01-02 ENCOUNTER — Other Ambulatory Visit: Payer: Self-pay | Admitting: Family Medicine

## 2023-01-02 NOTE — Telephone Encounter (Signed)
Copied from CRM [id number]. Topic: Clinical - Medication Refill >> Jan 02, 2023  3:08 PM Maxwell Marion wrote: Most Recent Primary Care Visit:  Provider: Lynnea Ferrier T  Department: [facility]  Visit Type: OFFICE VISIT  Date: 12/09/2022  Medication: ***  Has the patient contacted their pharmacy?  (Agent: If no, request that the patient contact the pharmacy for the refill. If patient does not wish to contact the pharmacy document the reason why and proceed with request.) (Agent: If yes, when and what did the pharmacy advise?)  Is this the correct pharmacy for this prescription?  If no, delete pharmacy and type the correct one.  This is the patient's preferred pharmacy:  [id number] [place], [place] - [address] [place] [address] Phone: [phone] Fax: [phone]  [facility] [address] [place] [address] Phone: [phone] Fax: [phone]   Has the prescription been filled recently?   Is the patient out of the medication?   Has the patient been seen for an appointment in the last year OR does the patient have an upcoming appointment?   Can we respond through MyChart?   Agent: Please be advised that Rx refills may take up to 3 business days. We ask that you follow-up with your pharmacy.

## 2023-01-03 ENCOUNTER — Other Ambulatory Visit: Payer: Self-pay | Admitting: Family Medicine

## 2023-01-03 ENCOUNTER — Encounter: Payer: Self-pay | Admitting: Family Medicine

## 2023-01-03 MED ORDER — OLANZAPINE 5 MG PO TABS: 5.0000 mg | ORAL_TABLET | Freq: Every day | ORAL | 3 refills | Status: AC

## 2023-01-08 DIAGNOSIS — F332 Major depressive disorder, recurrent severe without psychotic features: Secondary | ICD-10-CM | POA: Diagnosis not present

## 2023-01-15 DIAGNOSIS — F332 Major depressive disorder, recurrent severe without psychotic features: Secondary | ICD-10-CM | POA: Diagnosis not present

## 2023-01-27 ENCOUNTER — Other Ambulatory Visit: Payer: Self-pay | Admitting: Family Medicine

## 2023-01-27 DIAGNOSIS — E038 Other specified hypothyroidism: Secondary | ICD-10-CM

## 2023-01-29 DIAGNOSIS — F332 Major depressive disorder, recurrent severe without psychotic features: Secondary | ICD-10-CM | POA: Diagnosis not present

## 2023-02-09 ENCOUNTER — Encounter: Payer: Self-pay | Admitting: Family Medicine

## 2023-02-10 DIAGNOSIS — F3341 Major depressive disorder, recurrent, in partial remission: Secondary | ICD-10-CM | POA: Diagnosis not present

## 2023-02-10 DIAGNOSIS — F900 Attention-deficit hyperactivity disorder, predominantly inattentive type: Secondary | ICD-10-CM | POA: Diagnosis not present

## 2023-02-10 DIAGNOSIS — F410 Panic disorder [episodic paroxysmal anxiety] without agoraphobia: Secondary | ICD-10-CM | POA: Diagnosis not present

## 2023-02-12 DIAGNOSIS — F332 Major depressive disorder, recurrent severe without psychotic features: Secondary | ICD-10-CM | POA: Diagnosis not present

## 2023-02-14 DIAGNOSIS — J329 Chronic sinusitis, unspecified: Secondary | ICD-10-CM | POA: Diagnosis not present

## 2023-03-03 DIAGNOSIS — J069 Acute upper respiratory infection, unspecified: Secondary | ICD-10-CM | POA: Diagnosis not present

## 2023-03-03 DIAGNOSIS — J3489 Other specified disorders of nose and nasal sinuses: Secondary | ICD-10-CM | POA: Diagnosis not present

## 2023-03-03 DIAGNOSIS — R0981 Nasal congestion: Secondary | ICD-10-CM | POA: Diagnosis not present

## 2023-03-03 DIAGNOSIS — R051 Acute cough: Secondary | ICD-10-CM | POA: Diagnosis not present

## 2023-03-05 DIAGNOSIS — F332 Major depressive disorder, recurrent severe without psychotic features: Secondary | ICD-10-CM | POA: Diagnosis not present

## 2023-03-19 DIAGNOSIS — F332 Major depressive disorder, recurrent severe without psychotic features: Secondary | ICD-10-CM | POA: Diagnosis not present

## 2023-03-24 DIAGNOSIS — E291 Testicular hypofunction: Secondary | ICD-10-CM | POA: Diagnosis not present

## 2023-03-31 DIAGNOSIS — E291 Testicular hypofunction: Secondary | ICD-10-CM | POA: Diagnosis not present

## 2023-04-02 DIAGNOSIS — F332 Major depressive disorder, recurrent severe without psychotic features: Secondary | ICD-10-CM | POA: Diagnosis not present

## 2023-04-04 DIAGNOSIS — M5416 Radiculopathy, lumbar region: Secondary | ICD-10-CM | POA: Diagnosis not present

## 2023-04-10 ENCOUNTER — Other Ambulatory Visit: Payer: Self-pay | Admitting: Family Medicine

## 2023-04-15 DIAGNOSIS — F332 Major depressive disorder, recurrent severe without psychotic features: Secondary | ICD-10-CM | POA: Diagnosis not present

## 2023-04-29 DIAGNOSIS — M5416 Radiculopathy, lumbar region: Secondary | ICD-10-CM | POA: Diagnosis not present

## 2023-04-30 DIAGNOSIS — F332 Major depressive disorder, recurrent severe without psychotic features: Secondary | ICD-10-CM | POA: Diagnosis not present

## 2023-05-12 DIAGNOSIS — M5416 Radiculopathy, lumbar region: Secondary | ICD-10-CM | POA: Diagnosis not present

## 2023-05-15 DIAGNOSIS — F332 Major depressive disorder, recurrent severe without psychotic features: Secondary | ICD-10-CM | POA: Diagnosis not present

## 2023-05-16 DIAGNOSIS — E785 Hyperlipidemia, unspecified: Secondary | ICD-10-CM | POA: Diagnosis not present

## 2023-05-16 DIAGNOSIS — Z79899 Other long term (current) drug therapy: Secondary | ICD-10-CM | POA: Diagnosis not present

## 2023-05-16 DIAGNOSIS — Z1329 Encounter for screening for other suspected endocrine disorder: Secondary | ICD-10-CM | POA: Diagnosis not present

## 2023-05-16 DIAGNOSIS — Z130 Encounter for screening for diseases of the blood and blood-forming organs and certain disorders involving the immune mechanism: Secondary | ICD-10-CM | POA: Diagnosis not present

## 2023-05-21 DIAGNOSIS — M5416 Radiculopathy, lumbar region: Secondary | ICD-10-CM | POA: Diagnosis not present

## 2023-05-23 DIAGNOSIS — M5416 Radiculopathy, lumbar region: Secondary | ICD-10-CM | POA: Diagnosis not present

## 2023-05-28 DIAGNOSIS — M5416 Radiculopathy, lumbar region: Secondary | ICD-10-CM | POA: Diagnosis not present

## 2023-05-31 DIAGNOSIS — M545 Low back pain, unspecified: Secondary | ICD-10-CM | POA: Diagnosis not present

## 2023-06-03 ENCOUNTER — Other Ambulatory Visit: Payer: Self-pay | Admitting: Orthopedic Surgery

## 2023-06-03 DIAGNOSIS — M533 Sacrococcygeal disorders, not elsewhere classified: Secondary | ICD-10-CM

## 2023-06-04 DIAGNOSIS — M5416 Radiculopathy, lumbar region: Secondary | ICD-10-CM | POA: Diagnosis not present

## 2023-06-06 ENCOUNTER — Ambulatory Visit: Admitting: Family Medicine

## 2023-06-06 VITALS — BP 128/84 | HR 83 | Temp 98.4°F | Ht 71.0 in | Wt 211.8 lb

## 2023-06-06 DIAGNOSIS — N50812 Left testicular pain: Secondary | ICD-10-CM | POA: Diagnosis not present

## 2023-06-06 MED ORDER — PREDNISONE 20 MG PO TABS: ORAL_TABLET | ORAL | 0 refills | Status: DC

## 2023-06-06 NOTE — Progress Notes (Signed)
 Subjective:    Patient ID: Terry [name], male    DOB: [birth year], 38 y.o.   MRN: [id number]  Patient reports a 1-1/2-week history of pain in his left testicle.  He denies any injury in the testicle.  He denies any dysuria.  He denies any penile discharge.  He denies any rash.  He states that is a shooting pain into the testicle.  It can be intense.  Of note, he is seeing neurosurgery due to severe low back pain with sciatica.  He has had 2 different epidural steroid injections with no benefit and he states that they are considering surgery.  He questions if this pain in his testicle could be related to referred pain from his back. Past Medical History:  Diagnosis Date   Acute appendicitis 02/08/2013   ADHD (attention deficit hyperactivity disorder)    Allergic rhinitis    Allergy    Anxiety    Appendicitis, acute 02/08/2013   Depression    since 8th grade, hospitalized 2015 ([place])   HSV infection    Past Surgical History:  Procedure Laterality Date   APPENDECTOMY     2013   LAPAROSCOPIC APPENDECTOMY N/A 02/08/2013   Procedure: APPENDECTOMY LAPAROSCOPIC;  Surgeon: Velora Heckler, MD;  Location: [facility];  Service: General;  Laterality: N/A;   WISDOM TOOTH EXTRACTION     Current Outpatient Medications on File Prior to Visit  Medication Sig Dispense Refill   cephALEXin (KEFLEX) 500 MG capsule Take 1 capsule (500 mg total) by mouth 3 (three) times daily. 21 capsule 0   CLOMID 50 MG tablet Take by mouth.     clonazePAM (KLONOPIN) 0.5 MG tablet Take 1 tablet (0.5 mg total) by mouth 2 (two) times daily as needed for anxiety. 30 tablet 1   Dextromethorphan-buPROPion ER (AUVELITY) 45-105 MG TBCR Take by mouth.     levothyroxine (SYNTHROID) 50 MCG tablet TAKE 1 TABLET BY MOUTH EVERY DAY 30 tablet 3   lisdexamfetamine (VYVANSE) 60 MG capsule Take 60 mg by mouth every morning.     lithium carbonate (ESKALITH) 450 MG ER tablet SMARTSIG:1 Tablet(s) By Mouth Every Evening     OLANZapine  (ZYPREXA) 5 MG tablet Take 1 tablet (5 mg total) by mouth at bedtime. 90 tablet 3   SPRAVATO, 84 MG DOSE, 28 MG/DEVICE SOPK Place into both nostrils.     traZODone (DESYREL) 100 MG tablet TAKE 1 TABLET BY MOUTH EVERYDAY AT BEDTIME 90 tablet 3   valACYclovir (VALTREX) 1000 MG tablet Take 1 tablet (1,000 mg total) by mouth 2 (two) times daily as needed. 30 tablet 0   Vilazodone HCl (VIIBRYD) 10 MG TABS Take 10 mg by mouth daily.     No current facility-administered medications on file prior to visit.   No Known Allergies Social History   Socioeconomic History   Marital status: Single    Spouse name: Not on file   Number of children: 0   Years of education: Not on file   Highest education level: Bachelor's degree (e.g., BA, AB, BS)  Occupational History   Occupation: works in garden department    Comment: [employer]   Tobacco Use   Smoking status: Former    Current packs/day: 0.00    Average packs/day: 0.5 packs/day for 3.0 years (1.5 ttl pk-yrs)    Types: Cigarettes    Start date: 02/26/2004    Quit date: 02/26/2007    Years since quitting: 16.2   Smokeless tobacco: Never  Vaping Use  Vaping status: Never Used  Substance and Sexual Activity   Alcohol use: Yes    Alcohol/week: 9.0 standard drinks of alcohol    Types: 9 Cans of beer per week    Comment: 2-3   Drug use: Yes    Frequency: 7.0 times per week    Types: Marijuana   Sexual activity: Not on file  Other Topics Concern   Not on file  Social History Narrative   Lives at home alone   Right handed   Caffeine: 1 cup each morning    Social Drivers of Health   Financial Resource Strain: Not on file  Food Insecurity: Not on file  Transportation Needs: Not on file  Physical Activity: Not on file  Stress: Not on file  Social Connections: Unknown (07/09/2021)   [facility]   Social Network    Social Network: Not on file  Intimate Partner Violence: Unknown (06/01/2021)   Received from [facility], [facility]   HITS    Physically Hurt: Not on file    Insult or Talk Down To: Not on file    Threaten Physical Harm: Not on file    Scream or Curse: Not on file   Family History  Problem Relation Age of Onset   Allergic rhinitis Mother    Cancer Maternal Grandfather        bone and prostate     Review of Systems  All other systems reviewed and are negative.      Objective:   Physical Exam Vitals reviewed.  Constitutional:      General: He is not in acute distress.    Appearance: He is well-developed. He is not diaphoretic.  Cardiovascular:     Rate and Rhythm: Normal rate and regular rhythm.     Heart sounds: Normal heart sounds. No murmur heard.    No friction rub. No gallop.  Pulmonary:     Effort: Pulmonary effort is normal. No respiratory distress.     Breath sounds: Normal breath sounds. No wheezing or rales.  Chest:     Chest wall: No tenderness.  Abdominal:     General: There is no distension.     Palpations: Abdomen is soft.     Tenderness: There is no abdominal tenderness.     Hernia: There is no hernia in the left inguinal area or right inguinal area.  Genitourinary:    Pubic Area: No rash.      Penis: Normal and circumcised. No erythema, tenderness, discharge, swelling or lesions.      Testes:        Right: Mass, tenderness, testicular hydrocele or varicocele not present. Cremasteric reflex is present.         Left: Mass, tenderness, testicular hydrocele or varicocele not present. Cremasteric reflex is present.      Epididymis:     Right: Normal.     Left: Normal. Not inflamed or enlarged. No mass or tenderness.  Musculoskeletal:     Left elbow: No swelling, deformity or effusion. Normal range of motion. No tenderness.  Lymphadenopathy:     Lower Body: No right inguinal adenopathy. No left inguinal adenopathy.           Assessment & Plan:  Pain in left testicle Patient's testicular exam is normal today.  There is no evidence of  orchitis or epididymitis.  He has normal cremasteric reflex.  There is no evidence of testicular torsion.  There is no atrophy.  His  symptoms do not sound like pain secondary to a kidney stone.  He denies any hematuria or dysuria.  Therefore I believe that this is likely pain in the testicle due to inflammation or possibly nerve pain referred into the testicle.  I see no evidence of any infection or decreased vascular supply.  Therefore I will start the patient on a prednisone taper pack and reassess if no better in 1 week or sooner if worsening

## 2023-06-13 ENCOUNTER — Ambulatory Visit
Admission: RE | Admit: 2023-06-13 | Discharge: 2023-06-13 | Disposition: A | Discharge status: Home / Self Care | Source: Ambulatory Visit | Attending: Orthopedic Surgery | Admitting: Orthopedic Surgery

## 2023-06-13 DIAGNOSIS — M533 Sacrococcygeal disorders, not elsewhere classified: Secondary | ICD-10-CM

## 2023-06-17 DIAGNOSIS — M545 Low back pain, unspecified: Secondary | ICD-10-CM | POA: Diagnosis not present

## 2023-06-18 DIAGNOSIS — F900 Attention-deficit hyperactivity disorder, predominantly inattentive type: Secondary | ICD-10-CM | POA: Diagnosis not present

## 2023-06-18 DIAGNOSIS — F410 Panic disorder [episodic paroxysmal anxiety] without agoraphobia: Secondary | ICD-10-CM | POA: Diagnosis not present

## 2023-06-18 DIAGNOSIS — F332 Major depressive disorder, recurrent severe without psychotic features: Secondary | ICD-10-CM | POA: Diagnosis not present

## 2023-06-19 ENCOUNTER — Other Ambulatory Visit: Payer: Self-pay

## 2023-06-19 ENCOUNTER — Emergency Department (HOSPITAL_COMMUNITY)

## 2023-06-19 ENCOUNTER — Encounter (HOSPITAL_COMMUNITY): Payer: Self-pay | Admitting: Emergency Medicine

## 2023-06-19 ENCOUNTER — Emergency Department (HOSPITAL_COMMUNITY)
Admission: EM | Admit: 2023-06-19 | Discharge: 2023-06-20 | Disposition: A | Discharge status: Home / Self Care | Attending: Emergency Medicine | Admitting: Emergency Medicine

## 2023-06-19 DIAGNOSIS — F10929 Alcohol use, unspecified with intoxication, unspecified: Secondary | ICD-10-CM | POA: Diagnosis not present

## 2023-06-19 DIAGNOSIS — S6991XA Unspecified injury of right wrist, hand and finger(s), initial encounter: Secondary | ICD-10-CM | POA: Diagnosis not present

## 2023-06-19 DIAGNOSIS — F332 Major depressive disorder, recurrent severe without psychotic features: Secondary | ICD-10-CM | POA: Diagnosis not present

## 2023-06-19 DIAGNOSIS — S61411A Laceration without foreign body of right hand, initial encounter: Secondary | ICD-10-CM | POA: Insufficient documentation

## 2023-06-19 DIAGNOSIS — W25XXXA Contact with sharp glass, initial encounter: Secondary | ICD-10-CM | POA: Insufficient documentation

## 2023-06-19 DIAGNOSIS — Z23 Encounter for immunization: Secondary | ICD-10-CM | POA: Insufficient documentation

## 2023-06-19 DIAGNOSIS — R58 Hemorrhage, not elsewhere classified: Secondary | ICD-10-CM | POA: Diagnosis not present

## 2023-06-19 NOTE — ED Triage Notes (Signed)
 Pt in via [facility] with R dorsal hand injury, sustained after punching through glass window tonight. EMS states 1.5in lac present, no bleeding through kerlex wrap on ED arrival + radial pulses present. Endorses ETOH use   VS en route: 122/70 88HR 98% CBG 100

## 2023-06-20 DIAGNOSIS — S61411A Laceration without foreign body of right hand, initial encounter: Secondary | ICD-10-CM | POA: Diagnosis not present

## 2023-06-20 MED ORDER — LIDOCAINE-EPINEPHRINE (PF) 2 %-1:200000 IJ SOLN
20.0000 mL | Freq: Once | INTRAMUSCULAR | Status: AC
Start: 2023-06-20 — End: 2023-06-20
  Administered 2023-06-20: 20 mL
  Filled 2023-06-20: qty 20

## 2023-06-20 MED ORDER — TETANUS-DIPHTH-ACELL PERTUSSIS 5-2.5-18.5 LF-MCG/0.5 IM SUSY
0.5000 mL | PREFILLED_SYRINGE | Freq: Once | INTRAMUSCULAR | Status: AC
  Administered 2023-06-20: 0.5 mL via INTRAMUSCULAR
  Filled 2023-06-20: qty 0.5

## 2023-06-20 MED ORDER — BACITRACIN ZINC 500 UNIT/GM EX OINT
TOPICAL_OINTMENT | Freq: Two times a day (BID) | CUTANEOUS | Status: DC
  Administered 2023-06-20: 1 via TOPICAL
  Filled 2023-06-20: qty 1.8

## 2023-06-20 NOTE — ED Provider Notes (Signed)
 [facility] Emergency Department Provider Note MRN:  [id number]  Arrival date & time: 06/20/23     Chief Complaint   No chief complaint on file.   History of Present Illness   Terry [name] is a 38 y.o. year-old male presents to the ED with chief complaint of right hand laceration.  He states that he was drinking tonight and punched through a glass window.  He sustained a laceration to the back of his right hand.  He denies any weakness in his fingers.  He denies any other injuries.  He states that he was only drinking and did not use any drugs.  History provided by patient.   Review of Systems  Pertinent positive and negative review of systems noted in HPI.    Physical Exam   Vitals:   06/19/23 2346 06/20/23 0138  BP: 118/69 125/85  Pulse: 74 86  Resp: 16 18  Temp: 98.3 F (36.8 C) 97.9 F (36.6 C)  SpO2: 92% 98%    CONSTITUTIONAL:  non toxic-appearing, NAD NEURO:  Alert and oriented x 3, CN 3-12 grossly intact EYES:  eyes equal and reactive ENT/NECK:  Supple, no stridor  CARDIO:  normal rate, regular rhythm, appears well-perfused  PULM:  No respiratory distress,  GI/GU:  non-distended,  MSK/SPINE:  No gross deformities, no edema, moves all extremities, normal extensor strength of all fingers SKIN:  no rash, 4 cm triangular laceration to the posterior right hand, there is exposed tendon, but it appears intact   *Additional and/or pertinent findings included in MDM below  Diagnostic and Interventional Summary    EKG Interpretation Date/Time:    Ventricular Rate:    PR Interval:    QRS Duration:    QT Interval:    QTC Calculation:   R Axis:      Text Interpretation:         Labs Reviewed - No data to display  DG Hand Complete Right  Final Result      Medications  bacitracin  ointment (1 Application Topical Given 06/20/23 0115)  lidocaine -EPINEPHrine  (XYLOCAINE  W/EPI) 2 %-1:200000 (PF) injection 20 mL (20 mLs Infiltration  Given by Other 06/20/23 0109)  Tdap (BOOSTRIX) injection 0.5 mL (0.5 mLs Intramuscular Given 06/20/23 0020)     Procedures  /  Critical Care .Laceration Repair  Date/Time: 06/20/2023 12:51 AM  Performed by: Sherel Dikes, PA-C Authorized by: Sherel Dikes, PA-C   Consent:    Consent obtained:  Verbal   Consent given by:  Patient   Risks, benefits, and alternatives were discussed: yes     Risks discussed:  Infection, pain, poor cosmetic result, poor wound healing, need for additional repair, retained foreign body, tendon damage and vascular damage   Alternatives discussed:  No treatment Universal protocol:    Procedure explained and questions answered to patient or proxy's satisfaction: yes     Relevant documents present and verified: yes     Test results available: yes     Imaging studies available: yes     Required blood products, implants, devices, and special equipment available: yes     Site/side marked: yes     Immediately prior to procedure, a time out was called: yes     Patient identity confirmed:  Verbally with patient Anesthesia:    Anesthesia method:  Local infiltration   Local anesthetic:  Lidocaine  1% WITH epi Laceration details:    Location:  Hand   Hand location:  R hand, dorsum   Length (cm):  4 Exploration:    Imaging outcome: foreign body not noted     Wound exploration: wound explored through full range of motion and entire depth of wound visualized     Wound extent: no tendon damage     Contaminated: no   Treatment:    Area cleansed with:  Povidone-iodine and saline   Amount of cleaning:  Extensive Skin repair:    Repair method:  Sutures   Suture size:  4-0   Suture technique:  Running   Number of sutures:  10 Approximation:    Approximation:  Close Repair type:    Repair type:  Simple Post-procedure details:    Dressing:  Antibiotic ointment and non-adherent dressing   Procedure completion:  Tolerated well, no immediate complications   ED  Course and Medical Decision Making  I have reviewed the triage vital signs, the nursing notes, and pertinent available records from the EMR.  Social Determinants Affecting Complexity of Care: Patient has no clinically significant social determinants affecting this chief complaint..   ED Course:    Medical Decision Making Patient here with laceration to his right hand after punching through glass window.  He is intoxicated with alcohol.  His O2 saturation was initially low and he was put on nasal cannula supplemental O2.  Plain films of the right hand are negative for fracture.  I repaired the laceration at the bedside.    The laceration was down to the extensor tendons on the right hand, but I did not observe any tendon laceration or injury.  He had normal strength of his fingers.  He was observed for several hours in the ED and allowed to sober up.  He was able to ambulate without difficulty and without any hypoxia.  Vitals reassessed, and are reassuring and are stable.  Will discharge home.  Amount and/or Complexity of Data Reviewed Radiology: ordered and independent interpretation performed.    Details: No fracture or FB  Risk OTC drugs. Prescription drug management.         Consultants: No consultations were needed in caring for this patient.   Treatment and Plan: Emergency department workup does not suggest an emergent condition requiring admission or immediate intervention beyond  what has been performed at this time. The patient is safe for discharge and has  been instructed to return immediately for worsening symptoms, change in  symptoms or any other concerns    Final Clinical Impressions(s) / ED Diagnoses     ICD-10-CM   1. Injury of right hand, initial encounter  S69.91XA     2. Laceration of right hand without foreign body, initial encounter  Z61.096E       ED Discharge Orders     None         Discharge Instructions Discussed with and Provided to  Patient:   Discharge Instructions   None      Sherel Dikes, PA-C 06/20/23 0424    Eldon Greenland, MD 06/20/23 [phone]

## 2023-06-23 DIAGNOSIS — F332 Major depressive disorder, recurrent severe without psychotic features: Secondary | ICD-10-CM | POA: Diagnosis not present

## 2023-06-24 DIAGNOSIS — Z113 Encounter for screening for infections with a predominantly sexual mode of transmission: Secondary | ICD-10-CM | POA: Diagnosis not present

## 2023-06-24 DIAGNOSIS — Z118 Encounter for screening for other infectious and parasitic diseases: Secondary | ICD-10-CM | POA: Diagnosis not present

## 2023-06-24 DIAGNOSIS — N50811 Right testicular pain: Secondary | ICD-10-CM | POA: Diagnosis not present

## 2023-06-24 DIAGNOSIS — E291 Testicular hypofunction: Secondary | ICD-10-CM | POA: Diagnosis not present

## 2023-06-24 DIAGNOSIS — N50812 Left testicular pain: Secondary | ICD-10-CM | POA: Diagnosis not present

## 2023-06-26 DIAGNOSIS — F332 Major depressive disorder, recurrent severe without psychotic features: Secondary | ICD-10-CM | POA: Diagnosis not present

## 2023-06-30 ENCOUNTER — Ambulatory Visit (INDEPENDENT_AMBULATORY_CARE_PROVIDER_SITE_OTHER): Admitting: Family Medicine

## 2023-06-30 ENCOUNTER — Encounter: Payer: Self-pay | Admitting: Family Medicine

## 2023-06-30 VITALS — BP 136/84 | HR 78 | Temp 98.2°F | Ht 71.0 in | Wt 200.4 lb

## 2023-06-30 DIAGNOSIS — S61411D Laceration without foreign body of right hand, subsequent encounter: Secondary | ICD-10-CM | POA: Diagnosis not present

## 2023-06-30 DIAGNOSIS — Z4802 Encounter for removal of sutures: Secondary | ICD-10-CM

## 2023-06-30 DIAGNOSIS — F332 Major depressive disorder, recurrent severe without psychotic features: Secondary | ICD-10-CM | POA: Diagnosis not present

## 2023-06-30 NOTE — Progress Notes (Signed)
 Subjective:    Patient ID: Terry [name], male    DOB: 1985-07-06, 38 y.o.   MRN: [id number]  On April 24, the patient sustained a V-shaped laceration to the dorsum of his right hand.  The pinnacle of the "V"is just behind the third MCP joint.  Patient went to the emergency room where this was closed with suture.  Several of the sutures have popped and fallen out in the time in between.  He is here today for removal of the remaining sutures.  At this point there are 2 sutures on the radial aspect of the of the v.  Suture at the [facility].  1 suture on the ulnar aspect of the v. Past Medical History:  Diagnosis Date   Acute appendicitis 02/08/2013   ADHD (attention deficit hyperactivity disorder)    Allergic rhinitis    Allergy     Anxiety    Appendicitis, acute 02/08/2013   Depression    since 8th grade, hospitalized 2015 ([place] )   HSV infection    Past Surgical History:  Procedure Laterality Date   APPENDECTOMY     2013   LAPAROSCOPIC APPENDECTOMY N/A 02/08/2013   Procedure: APPENDECTOMY LAPAROSCOPIC;  Surgeon: Keitha Pata, MD;  Location: [facility];  Service: General;  Laterality: N/A;   WISDOM TOOTH EXTRACTION     Current Outpatient Medications on File Prior to Visit  Medication Sig Dispense Refill   CLOMID 50 MG tablet Take by mouth.     clonazePAM  (KLONOPIN ) 0.5 MG tablet Take 1 tablet (0.5 mg total) by mouth 2 (two) times daily as needed for anxiety. 30 tablet 1   Dextromethorphan-buPROPion ER (AUVELITY) 45-105 MG TBCR Take by mouth.     levothyroxine  (SYNTHROID ) 50 MCG tablet TAKE 1 TABLET BY MOUTH EVERY DAY 30 tablet 3   lisdexamfetamine (VYVANSE ) 60 MG capsule Take 60 mg by mouth every morning.     lithium  carbonate (ESKALITH) 450 MG ER tablet SMARTSIG:1 Tablet(s) By Mouth Every Evening     OLANZapine  (ZYPREXA ) 5 MG tablet Take 1 tablet (5 mg total) by mouth at bedtime. 90 tablet 3   SPRAVATO, 84 MG DOSE, 28 MG/DEVICE SOPK Place into both nostrils.     traZODone  (DESYREL )  100 MG tablet TAKE 1 TABLET BY MOUTH EVERYDAY AT BEDTIME 90 tablet 3   valACYclovir  (VALTREX ) 1000 MG tablet Take 1 tablet (1,000 mg total) by mouth 2 (two) times daily as needed. 30 tablet 0   No current facility-administered medications on file prior to visit.   No Known Allergies Social History   Socioeconomic History   Marital status: Single    Spouse name: Not on file   Number of children: 0   Years of education: Not on file   Highest education level: Bachelor's degree (e.g., BA, AB, BS)  Occupational History   Occupation: works in garden department    Comment: [employer]   Tobacco Use   Smoking status: Former    Current packs/day: 0.00    Average packs/day: 0.5 packs/day for 3.0 years (1.5 ttl pk-yrs)    Types: Cigarettes    Start date: 02/26/2004    Quit date: 02/26/2007    Years since quitting: 16.3   Smokeless tobacco: Never  Vaping Use   Vaping status: Never Used  Substance and Sexual Activity   Alcohol use: Yes    Alcohol/week: 9.0 standard drinks of alcohol    Types: 9 Cans of beer per week    Comment: 2-3   Drug use:  Yes    Frequency: 7.0 times per week    Types: Marijuana   Sexual activity: Not on file  Other Topics Concern   Not on file  Social History Narrative   Lives at home alone   Right handed   Caffeine: 1 cup each morning    Social Drivers of Health   Financial Resource Strain: Not on file  Food Insecurity: Not on file  Transportation Needs: Not on file  Physical Activity: Not on file  Stress: Not on file  Social Connections: Unknown (07/09/2021)   Received from [facility], [facility]   Social Network    Social Network: Not on file  Intimate Partner Violence: Unknown (06/01/2021)   Received from [facility], [facility]   HITS    Physically Hurt: Not on file    Insult or Talk Down To: Not on file    Threaten Physical Harm: Not on file    Scream or Curse: Not on file   Family History  Problem Relation Age of Onset   Allergic  rhinitis Mother    Cancer Maternal Grandfather        bone and prostate     Review of Systems  All other systems reviewed and are negative.      Objective:   Physical Exam Vitals reviewed.  Constitutional:      General: He is not in acute distress.    Appearance: He is well-developed. He is not diaphoretic.  Cardiovascular:     Rate and Rhythm: Normal rate and regular rhythm.     Heart sounds: Normal heart sounds. No murmur heard.    No friction rub. No gallop.  Pulmonary:     Effort: Pulmonary effort is normal. No respiratory distress.     Breath sounds: Normal breath sounds. No wheezing or rales.  Chest:     Chest wall: No tenderness.  Abdominal:     General: There is no distension.     Palpations: Abdomen is soft.     Tenderness: There is no abdominal tenderness.     Hernia: There is no hernia in the left inguinal area or right inguinal area.  Genitourinary:    Pubic Area: No rash.      Penis: Normal and circumcised. No erythema, tenderness, discharge, swelling or lesions.      Testes:        Right: Mass, tenderness, testicular hydrocele or varicocele not present. Cremasteric reflex is present.         Left: Mass, tenderness, testicular hydrocele or varicocele not present. Cremasteric reflex is present.      Epididymis:     Right: Normal.     Left: Normal. Not inflamed or enlarged. No mass or tenderness.  Musculoskeletal:     Right hand: Laceration, tenderness and bony tenderness present. Normal range of motion. Normal strength. Normal sensation. Normal capillary refill. Normal pulse.       Hands:  Lymphadenopathy:     Lower Body: No right inguinal adenopathy. No left inguinal adenopathy.           Assessment & Plan:  Visit for suture removal I clipped and removed 4 sutures as diagrammed above.  The wound is clean dry and intact with no evidence of cellulitis.  He is still tender to palpation around the first MCP joint.  There is no tenderness in the anatomic  snuffbox.  Finkelstein's maneuver is negative.  I reinforced the wound with Steri-Strips.

## 2023-07-03 DIAGNOSIS — F332 Major depressive disorder, recurrent severe without psychotic features: Secondary | ICD-10-CM | POA: Diagnosis not present

## 2023-07-03 DIAGNOSIS — M47816 Spondylosis without myelopathy or radiculopathy, lumbar region: Secondary | ICD-10-CM | POA: Diagnosis not present

## 2023-07-07 DIAGNOSIS — F332 Major depressive disorder, recurrent severe without psychotic features: Secondary | ICD-10-CM | POA: Diagnosis not present

## 2023-07-09 DIAGNOSIS — F332 Major depressive disorder, recurrent severe without psychotic features: Secondary | ICD-10-CM | POA: Diagnosis not present

## 2023-07-10 ENCOUNTER — Other Ambulatory Visit: Payer: Self-pay

## 2023-07-10 ENCOUNTER — Encounter: Payer: Self-pay | Admitting: Family Medicine

## 2023-07-10 DIAGNOSIS — M5126 Other intervertebral disc displacement, lumbar region: Secondary | ICD-10-CM | POA: Diagnosis not present

## 2023-07-10 DIAGNOSIS — N50812 Left testicular pain: Secondary | ICD-10-CM

## 2023-07-10 DIAGNOSIS — E237 Disorder of pituitary gland, unspecified: Secondary | ICD-10-CM | POA: Diagnosis not present

## 2023-07-11 ENCOUNTER — Other Ambulatory Visit: Payer: Self-pay | Admitting: Family Medicine

## 2023-07-11 DIAGNOSIS — N50812 Left testicular pain: Secondary | ICD-10-CM

## 2023-07-14 ENCOUNTER — Ambulatory Visit
Admission: RE | Admit: 2023-07-14 | Discharge: 2023-07-14 | Disposition: A | Discharge status: Home / Self Care | Source: Ambulatory Visit | Attending: Family Medicine | Admitting: Family Medicine

## 2023-07-14 DIAGNOSIS — N50812 Left testicular pain: Secondary | ICD-10-CM

## 2023-07-14 DIAGNOSIS — F332 Major depressive disorder, recurrent severe without psychotic features: Secondary | ICD-10-CM | POA: Diagnosis not present

## 2023-07-15 ENCOUNTER — Ambulatory Visit: Payer: Self-pay | Admitting: Family Medicine

## 2023-07-18 DIAGNOSIS — E039 Hypothyroidism, unspecified: Secondary | ICD-10-CM | POA: Diagnosis not present

## 2023-07-18 DIAGNOSIS — F1011 Alcohol abuse, in remission: Secondary | ICD-10-CM | POA: Diagnosis not present

## 2023-07-18 DIAGNOSIS — F1221 Cannabis dependence, in remission: Secondary | ICD-10-CM | POA: Diagnosis not present

## 2023-07-18 DIAGNOSIS — Z79899 Other long term (current) drug therapy: Secondary | ICD-10-CM | POA: Diagnosis not present

## 2023-07-18 DIAGNOSIS — E559 Vitamin D deficiency, unspecified: Secondary | ICD-10-CM | POA: Diagnosis not present

## 2023-07-18 DIAGNOSIS — F332 Major depressive disorder, recurrent severe without psychotic features: Secondary | ICD-10-CM | POA: Diagnosis not present

## 2023-07-18 DIAGNOSIS — F4010 Social phobia, unspecified: Secondary | ICD-10-CM | POA: Diagnosis not present

## 2023-07-23 DIAGNOSIS — F1020 Alcohol dependence, uncomplicated: Secondary | ICD-10-CM | POA: Diagnosis not present

## 2023-07-23 DIAGNOSIS — E039 Hypothyroidism, unspecified: Secondary | ICD-10-CM | POA: Diagnosis not present

## 2023-07-23 DIAGNOSIS — F1220 Cannabis dependence, uncomplicated: Secondary | ICD-10-CM | POA: Diagnosis not present

## 2023-07-23 DIAGNOSIS — F1011 Alcohol abuse, in remission: Secondary | ICD-10-CM | POA: Diagnosis not present

## 2023-07-28 DIAGNOSIS — F332 Major depressive disorder, recurrent severe without psychotic features: Secondary | ICD-10-CM | POA: Diagnosis not present

## 2023-07-31 DIAGNOSIS — E039 Hypothyroidism, unspecified: Secondary | ICD-10-CM | POA: Diagnosis not present

## 2023-07-31 DIAGNOSIS — F1020 Alcohol dependence, uncomplicated: Secondary | ICD-10-CM | POA: Diagnosis not present

## 2023-07-31 DIAGNOSIS — F1220 Cannabis dependence, uncomplicated: Secondary | ICD-10-CM | POA: Diagnosis not present

## 2023-07-31 DIAGNOSIS — F1011 Alcohol abuse, in remission: Secondary | ICD-10-CM | POA: Diagnosis not present

## 2023-08-05 IMAGING — DX DG WRIST COMPLETE 3+V*L*
4 series · 4 of 4 positions shown · non-contrast
Comparison: None.

CLINICAL DATA: Left wrist pain.

EXAM:
LEFT WRIST - COMPLETE 3+ VIEW

[wrist ap]
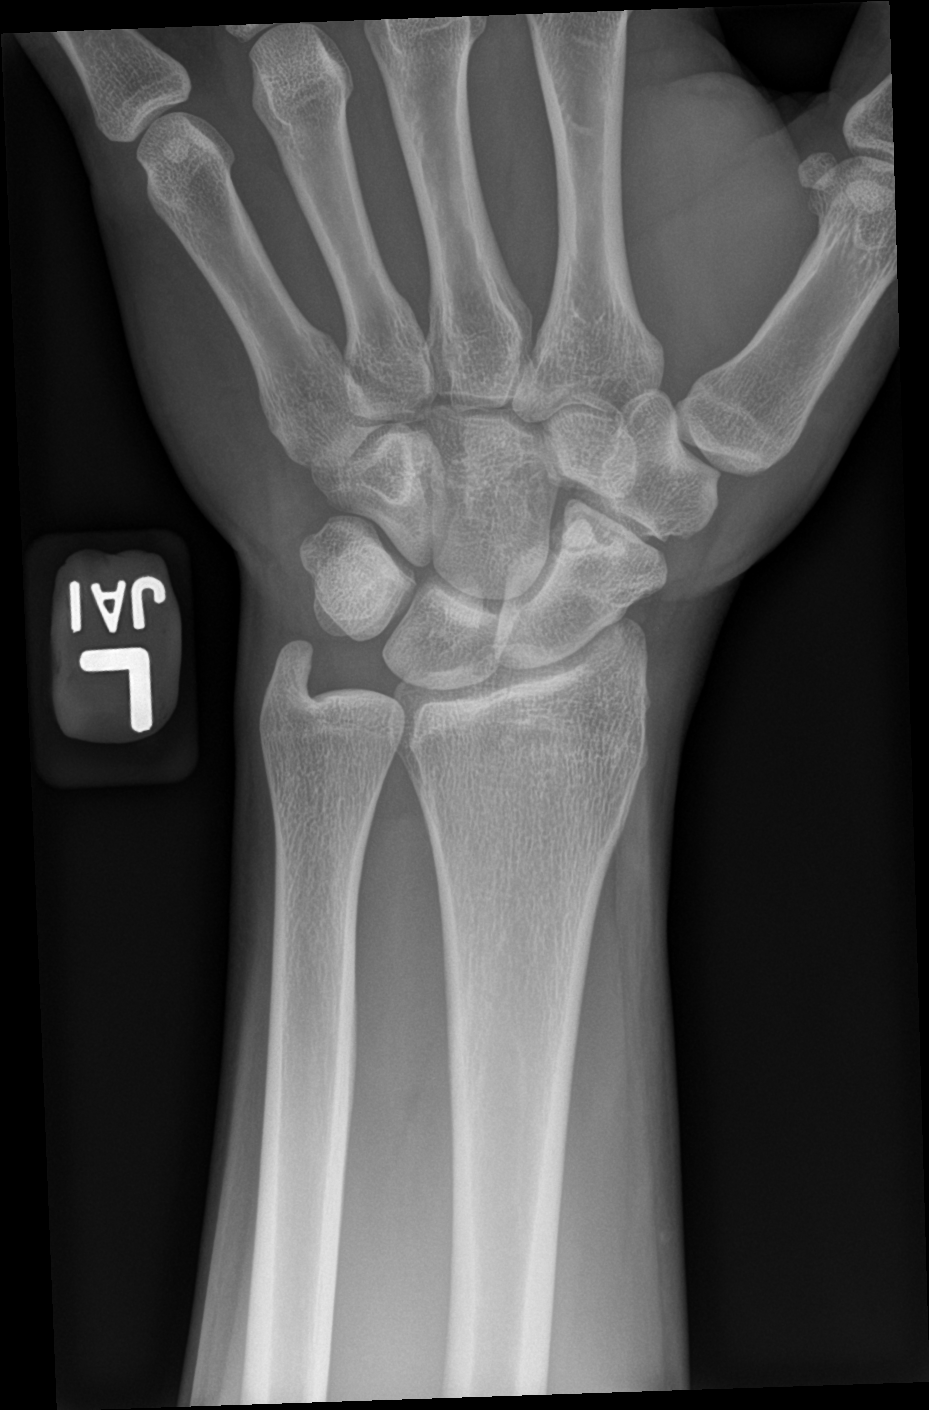

[wrist obl]
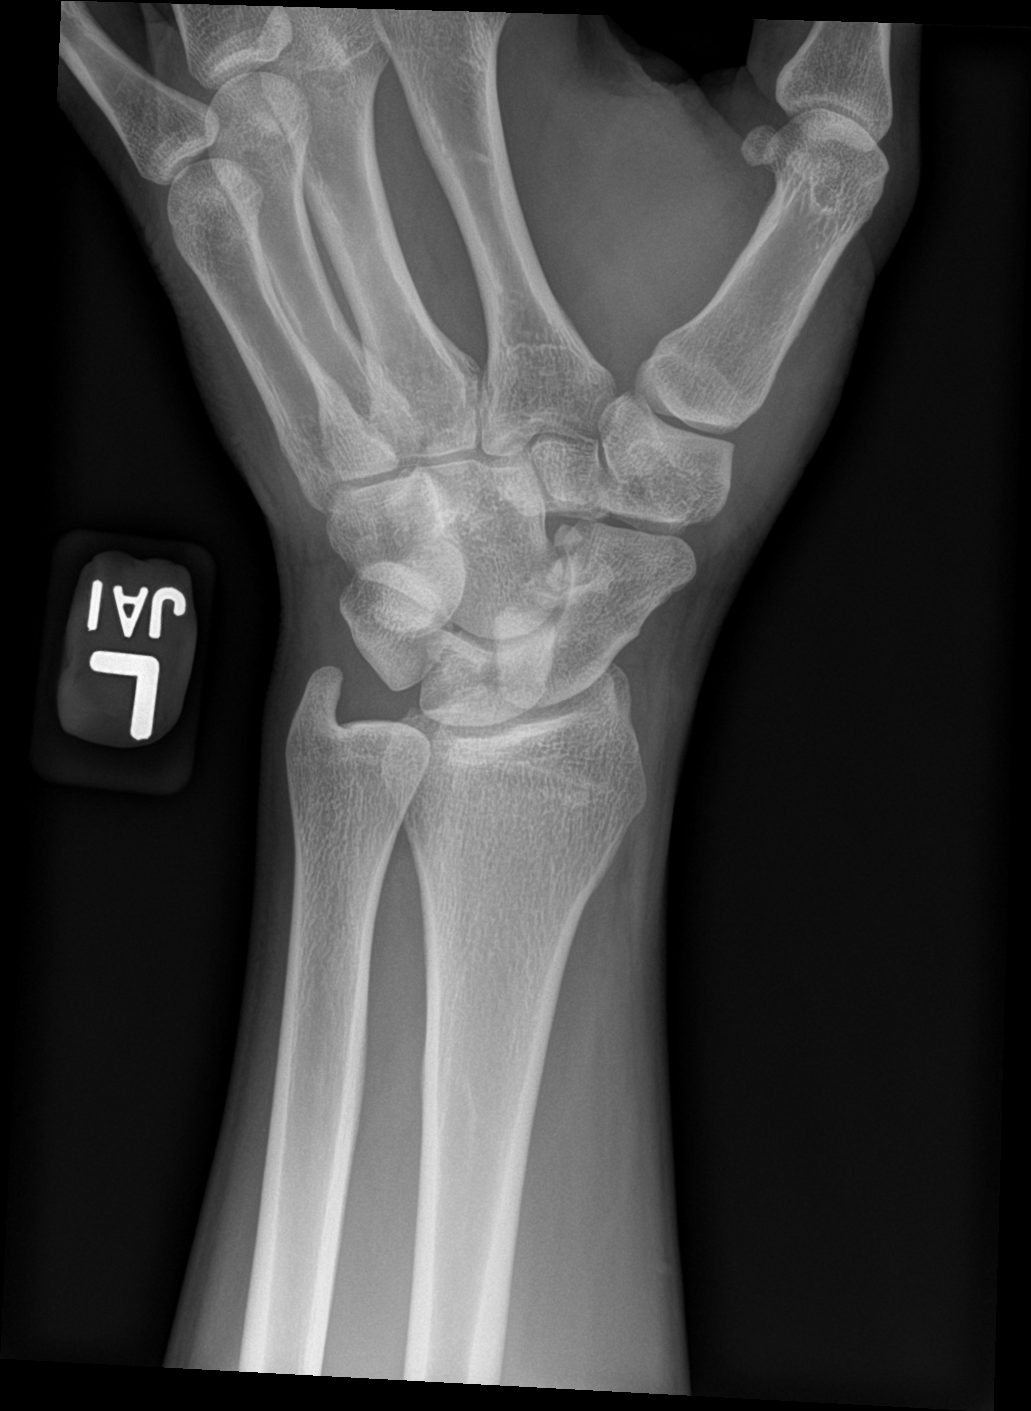

[wrist lat]
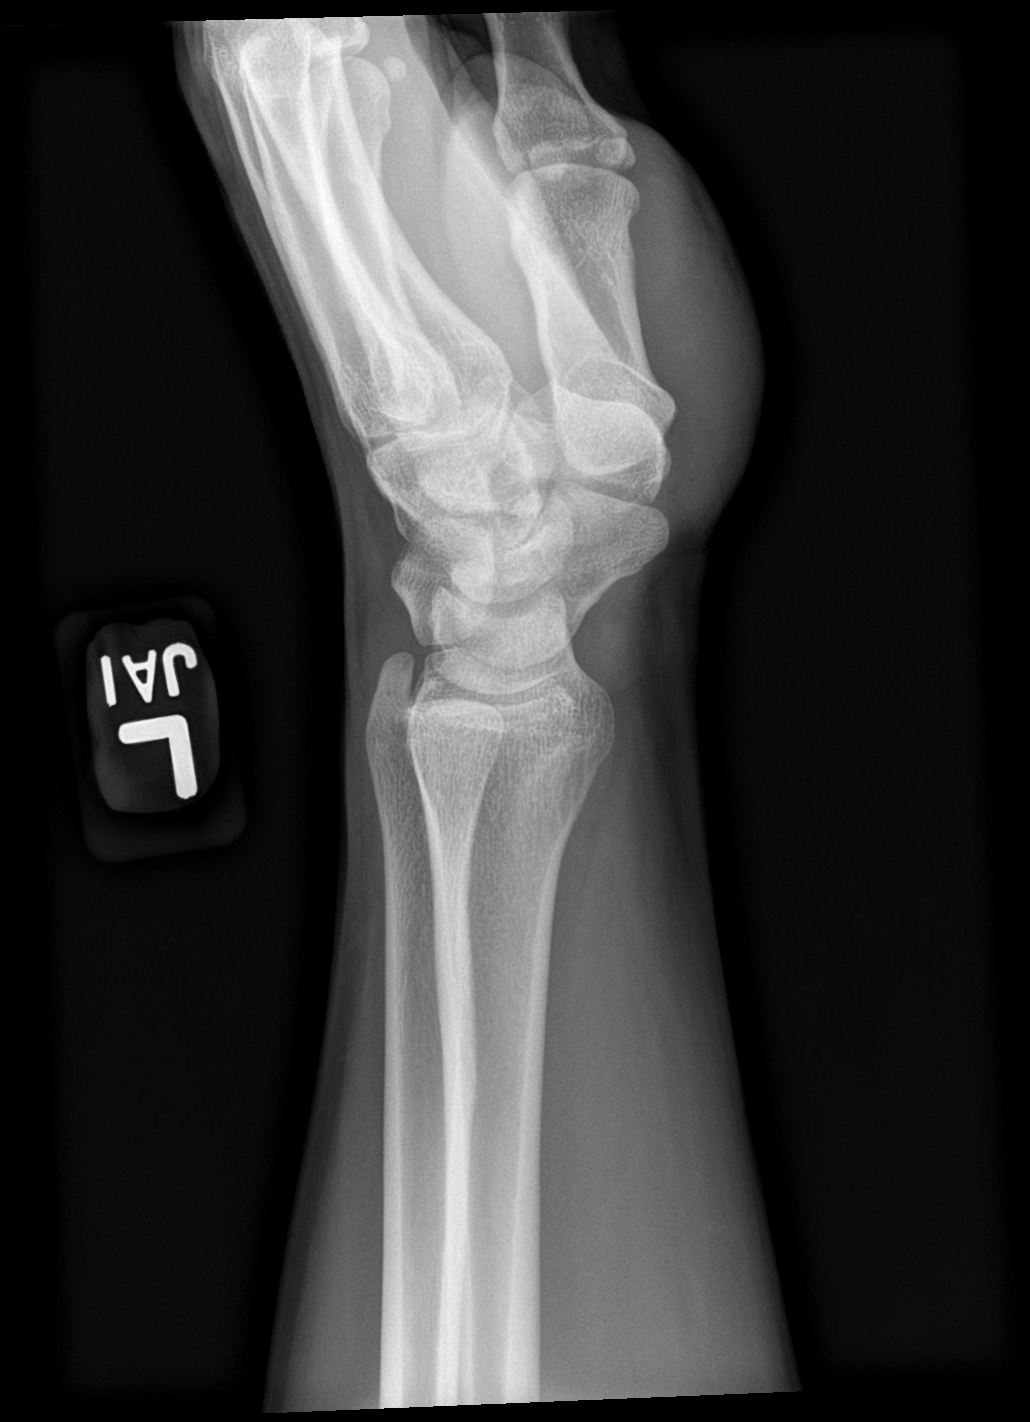

[scaphoid]
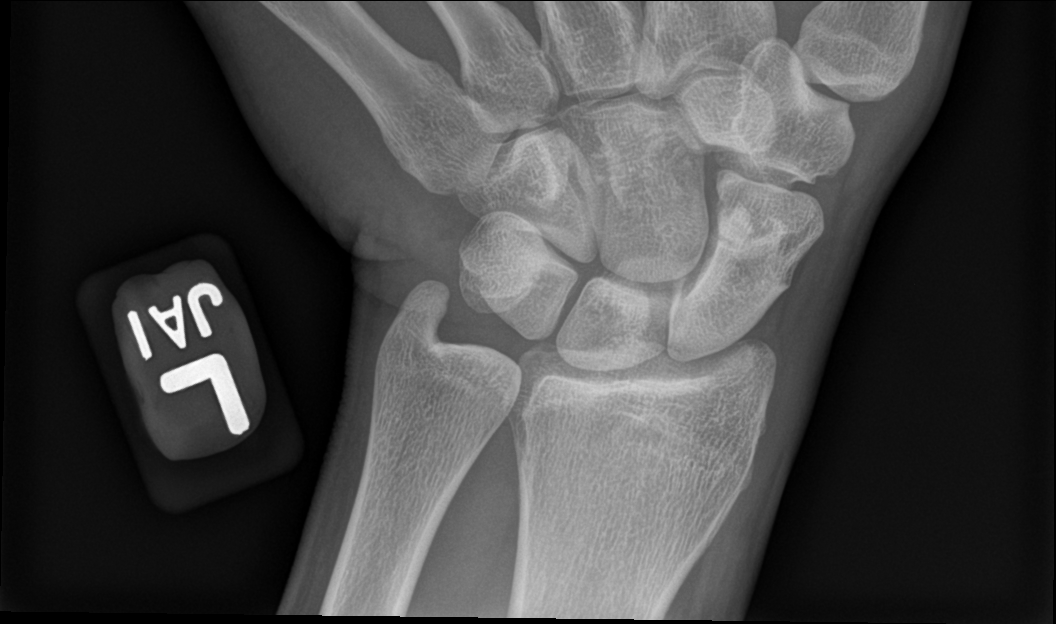

[4 of 4 positions shown; findings below may reference images not displayed]

FINDINGS: Well corticated soft tissue calcifications are seen adjacent to the
distal scaphoid. Two or 3 of these calcifications are identified.
The scaphoid appears to demonstrate a donor site for these
calcifications.
IMPRESSION: Multiple soft tissue calcifications adjacent to the distal scaphoid
with an apparent donor site from the scaphoid. Findings likely
represents sequela of remote trauma/fracture.

## 2023-08-06 ENCOUNTER — Encounter: Payer: Self-pay | Admitting: Family Medicine

## 2023-08-08 DIAGNOSIS — F332 Major depressive disorder, recurrent severe without psychotic features: Secondary | ICD-10-CM | POA: Diagnosis not present

## 2023-08-11 DIAGNOSIS — F332 Major depressive disorder, recurrent severe without psychotic features: Secondary | ICD-10-CM | POA: Diagnosis not present

## 2023-08-12 DIAGNOSIS — F900 Attention-deficit hyperactivity disorder, predominantly inattentive type: Secondary | ICD-10-CM | POA: Diagnosis not present

## 2023-08-12 DIAGNOSIS — F331 Major depressive disorder, recurrent, moderate: Secondary | ICD-10-CM | POA: Diagnosis not present

## 2023-08-13 DIAGNOSIS — S61411A Laceration without foreign body of right hand, initial encounter: Secondary | ICD-10-CM | POA: Diagnosis not present

## 2023-08-13 DIAGNOSIS — S63641A Sprain of metacarpophalangeal joint of right thumb, initial encounter: Secondary | ICD-10-CM | POA: Diagnosis not present

## 2023-08-13 DIAGNOSIS — M79644 Pain in right finger(s): Secondary | ICD-10-CM | POA: Diagnosis not present

## 2023-08-13 DIAGNOSIS — S61011A Laceration without foreign body of right thumb without damage to nail, initial encounter: Secondary | ICD-10-CM | POA: Diagnosis not present

## 2023-08-14 ENCOUNTER — Ambulatory Visit: Admitting: Family Medicine

## 2023-08-14 DIAGNOSIS — F332 Major depressive disorder, recurrent severe without psychotic features: Secondary | ICD-10-CM | POA: Diagnosis not present

## 2023-08-15 DIAGNOSIS — F332 Major depressive disorder, recurrent severe without psychotic features: Secondary | ICD-10-CM | POA: Diagnosis not present

## 2023-08-15 DIAGNOSIS — Z1329 Encounter for screening for other suspected endocrine disorder: Secondary | ICD-10-CM | POA: Diagnosis not present

## 2023-08-15 DIAGNOSIS — F5104 Psychophysiologic insomnia: Secondary | ICD-10-CM | POA: Diagnosis not present

## 2023-08-15 DIAGNOSIS — F1220 Cannabis dependence, uncomplicated: Secondary | ICD-10-CM | POA: Diagnosis not present

## 2023-08-15 DIAGNOSIS — F1221 Cannabis dependence, in remission: Secondary | ICD-10-CM | POA: Diagnosis not present

## 2023-08-15 DIAGNOSIS — Z130 Encounter for screening for diseases of the blood and blood-forming organs and certain disorders involving the immune mechanism: Secondary | ICD-10-CM | POA: Diagnosis not present

## 2023-08-18 ENCOUNTER — Telehealth: Payer: Self-pay | Admitting: Neurology

## 2023-08-18 DIAGNOSIS — F332 Major depressive disorder, recurrent severe without psychotic features: Secondary | ICD-10-CM | POA: Diagnosis not present

## 2023-08-18 NOTE — Telephone Encounter (Signed)
 Received sleep referral from Dr. Emilio Aurora. Placed in sleep referral box

## 2023-08-19 DIAGNOSIS — E237 Disorder of pituitary gland, unspecified: Secondary | ICD-10-CM | POA: Diagnosis not present

## 2023-08-21 DIAGNOSIS — F332 Major depressive disorder, recurrent severe without psychotic features: Secondary | ICD-10-CM | POA: Diagnosis not present

## 2023-08-25 DIAGNOSIS — F332 Major depressive disorder, recurrent severe without psychotic features: Secondary | ICD-10-CM | POA: Diagnosis not present

## 2023-08-28 DIAGNOSIS — F332 Major depressive disorder, recurrent severe without psychotic features: Secondary | ICD-10-CM | POA: Diagnosis not present

## 2023-09-01 ENCOUNTER — Ambulatory Visit: Admitting: Family Medicine

## 2023-09-01 DIAGNOSIS — F332 Major depressive disorder, recurrent severe without psychotic features: Secondary | ICD-10-CM | POA: Diagnosis not present

## 2023-09-03 DIAGNOSIS — F332 Major depressive disorder, recurrent severe without psychotic features: Secondary | ICD-10-CM | POA: Diagnosis not present

## 2023-09-04 DIAGNOSIS — F332 Major depressive disorder, recurrent severe without psychotic features: Secondary | ICD-10-CM | POA: Diagnosis not present

## 2023-09-05 DIAGNOSIS — F332 Major depressive disorder, recurrent severe without psychotic features: Secondary | ICD-10-CM | POA: Diagnosis not present

## 2023-09-08 DIAGNOSIS — F332 Major depressive disorder, recurrent severe without psychotic features: Secondary | ICD-10-CM | POA: Diagnosis not present

## 2023-09-09 DIAGNOSIS — F332 Major depressive disorder, recurrent severe without psychotic features: Secondary | ICD-10-CM | POA: Diagnosis not present

## 2023-09-10 DIAGNOSIS — F332 Major depressive disorder, recurrent severe without psychotic features: Secondary | ICD-10-CM | POA: Diagnosis not present

## 2023-09-11 DIAGNOSIS — F332 Major depressive disorder, recurrent severe without psychotic features: Secondary | ICD-10-CM | POA: Diagnosis not present

## 2023-09-12 DIAGNOSIS — F900 Attention-deficit hyperactivity disorder, predominantly inattentive type: Secondary | ICD-10-CM | POA: Diagnosis not present

## 2023-09-12 DIAGNOSIS — F5101 Primary insomnia: Secondary | ICD-10-CM | POA: Diagnosis not present

## 2023-09-12 DIAGNOSIS — F3341 Major depressive disorder, recurrent, in partial remission: Secondary | ICD-10-CM | POA: Diagnosis not present

## 2023-09-15 DIAGNOSIS — F332 Major depressive disorder, recurrent severe without psychotic features: Secondary | ICD-10-CM | POA: Diagnosis not present

## 2023-09-16 DIAGNOSIS — F332 Major depressive disorder, recurrent severe without psychotic features: Secondary | ICD-10-CM | POA: Diagnosis not present

## 2023-09-17 DIAGNOSIS — F332 Major depressive disorder, recurrent severe without psychotic features: Secondary | ICD-10-CM | POA: Diagnosis not present

## 2023-09-18 DIAGNOSIS — E237 Disorder of pituitary gland, unspecified: Secondary | ICD-10-CM | POA: Diagnosis not present

## 2023-09-18 DIAGNOSIS — F332 Major depressive disorder, recurrent severe without psychotic features: Secondary | ICD-10-CM | POA: Diagnosis not present

## 2023-09-19 DIAGNOSIS — F332 Major depressive disorder, recurrent severe without psychotic features: Secondary | ICD-10-CM | POA: Diagnosis not present

## 2023-09-22 DIAGNOSIS — F332 Major depressive disorder, recurrent severe without psychotic features: Secondary | ICD-10-CM | POA: Diagnosis not present

## 2023-09-23 DIAGNOSIS — F332 Major depressive disorder, recurrent severe without psychotic features: Secondary | ICD-10-CM | POA: Diagnosis not present

## 2023-09-24 DIAGNOSIS — F332 Major depressive disorder, recurrent severe without psychotic features: Secondary | ICD-10-CM | POA: Diagnosis not present

## 2023-09-25 DIAGNOSIS — F332 Major depressive disorder, recurrent severe without psychotic features: Secondary | ICD-10-CM | POA: Diagnosis not present

## 2023-09-26 DIAGNOSIS — F332 Major depressive disorder, recurrent severe without psychotic features: Secondary | ICD-10-CM | POA: Diagnosis not present

## 2023-09-29 DIAGNOSIS — F332 Major depressive disorder, recurrent severe without psychotic features: Secondary | ICD-10-CM | POA: Diagnosis not present

## 2023-09-30 DIAGNOSIS — E291 Testicular hypofunction: Secondary | ICD-10-CM | POA: Diagnosis not present

## 2023-09-30 DIAGNOSIS — E039 Hypothyroidism, unspecified: Secondary | ICD-10-CM | POA: Diagnosis not present

## 2023-10-01 DIAGNOSIS — F332 Major depressive disorder, recurrent severe without psychotic features: Secondary | ICD-10-CM | POA: Diagnosis not present

## 2023-10-02 DIAGNOSIS — F332 Major depressive disorder, recurrent severe without psychotic features: Secondary | ICD-10-CM | POA: Diagnosis not present

## 2023-10-03 DIAGNOSIS — F332 Major depressive disorder, recurrent severe without psychotic features: Secondary | ICD-10-CM | POA: Diagnosis not present

## 2023-10-06 DIAGNOSIS — F332 Major depressive disorder, recurrent severe without psychotic features: Secondary | ICD-10-CM | POA: Diagnosis not present

## 2023-10-07 DIAGNOSIS — F332 Major depressive disorder, recurrent severe without psychotic features: Secondary | ICD-10-CM | POA: Diagnosis not present

## 2023-10-08 ENCOUNTER — Telehealth: Payer: Self-pay

## 2023-10-08 DIAGNOSIS — F332 Major depressive disorder, recurrent severe without psychotic features: Secondary | ICD-10-CM | POA: Diagnosis not present

## 2023-10-08 NOTE — Telephone Encounter (Signed)
 Copied from CRM [id number]. Topic: Clinical - Request for Lab/Test Order >> Oct 08, 2023  9:57 AM Ivette P wrote: Reason for CRM: Pt called in to follow up on blood work for Thyroid  pt stated he was advsied to get lab work done last visit.   Do not see work order.   Pls follow up with pt

## 2023-10-09 ENCOUNTER — Encounter: Payer: Self-pay | Admitting: Family Medicine

## 2023-10-09 ENCOUNTER — Other Ambulatory Visit: Payer: Self-pay

## 2023-10-09 DIAGNOSIS — E038 Other specified hypothyroidism: Secondary | ICD-10-CM

## 2023-10-09 DIAGNOSIS — F332 Major depressive disorder, recurrent severe without psychotic features: Secondary | ICD-10-CM | POA: Diagnosis not present

## 2023-10-09 MED ORDER — LEVOTHYROXINE SODIUM 50 MCG PO TABS: 50.0000 ug | ORAL_TABLET | Freq: Every day | ORAL | 3 refills | Status: DC

## 2023-10-09 NOTE — Telephone Encounter (Signed)
 MyChart msg sent.

## 2023-10-10 ENCOUNTER — Other Ambulatory Visit

## 2023-10-10 DIAGNOSIS — F411 Generalized anxiety disorder: Secondary | ICD-10-CM

## 2023-10-10 DIAGNOSIS — E663 Overweight: Secondary | ICD-10-CM

## 2023-10-10 DIAGNOSIS — F339 Major depressive disorder, recurrent, unspecified: Secondary | ICD-10-CM

## 2023-10-10 DIAGNOSIS — F332 Major depressive disorder, recurrent severe without psychotic features: Secondary | ICD-10-CM | POA: Diagnosis not present

## 2023-10-11 LAB — TSH: TSH: 1.7 m[IU]/L (ref 0.40–4.50)

## 2023-10-13 ENCOUNTER — Ambulatory Visit: Payer: Self-pay | Admitting: Family Medicine

## 2023-10-13 ENCOUNTER — Telehealth: Payer: Self-pay

## 2023-10-13 DIAGNOSIS — F332 Major depressive disorder, recurrent severe without psychotic features: Secondary | ICD-10-CM | POA: Diagnosis not present

## 2023-10-13 NOTE — Telephone Encounter (Signed)
 Copied from CRM #[id number]. Topic: Clinical - Lab/Test Results >> Oct 13, 2023  2:28 PM Larissa RAMAN wrote: Reason for CRM: Patient requesting lab results. Relayed results per provider's note, verbatim. Patient verbalized understanding, no additional questions. Advised medication sent to pharmacy on 8/14.

## 2023-10-14 DIAGNOSIS — F332 Major depressive disorder, recurrent severe without psychotic features: Secondary | ICD-10-CM | POA: Diagnosis not present

## 2023-10-15 DIAGNOSIS — S61411A Laceration without foreign body of right hand, initial encounter: Secondary | ICD-10-CM | POA: Diagnosis not present

## 2023-10-15 DIAGNOSIS — F332 Major depressive disorder, recurrent severe without psychotic features: Secondary | ICD-10-CM | POA: Diagnosis not present

## 2023-10-15 DIAGNOSIS — S63641A Sprain of metacarpophalangeal joint of right thumb, initial encounter: Secondary | ICD-10-CM | POA: Diagnosis not present

## 2023-10-15 DIAGNOSIS — S61011A Laceration without foreign body of right thumb without damage to nail, initial encounter: Secondary | ICD-10-CM | POA: Diagnosis not present

## 2023-10-15 NOTE — Progress Notes (Signed)
 Pt. Informed by my chart result letter

## 2023-10-16 DIAGNOSIS — F332 Major depressive disorder, recurrent severe without psychotic features: Secondary | ICD-10-CM | POA: Diagnosis not present

## 2023-10-17 DIAGNOSIS — F332 Major depressive disorder, recurrent severe without psychotic features: Secondary | ICD-10-CM | POA: Diagnosis not present

## 2023-10-20 DIAGNOSIS — F332 Major depressive disorder, recurrent severe without psychotic features: Secondary | ICD-10-CM | POA: Diagnosis not present

## 2023-10-20 DIAGNOSIS — E291 Testicular hypofunction: Secondary | ICD-10-CM | POA: Diagnosis not present

## 2023-10-21 DIAGNOSIS — F332 Major depressive disorder, recurrent severe without psychotic features: Secondary | ICD-10-CM | POA: Diagnosis not present

## 2023-10-22 DIAGNOSIS — F332 Major depressive disorder, recurrent severe without psychotic features: Secondary | ICD-10-CM | POA: Diagnosis not present

## 2023-10-23 DIAGNOSIS — F332 Major depressive disorder, recurrent severe without psychotic features: Secondary | ICD-10-CM | POA: Diagnosis not present

## 2023-10-24 DIAGNOSIS — F332 Major depressive disorder, recurrent severe without psychotic features: Secondary | ICD-10-CM | POA: Diagnosis not present

## 2023-10-28 DIAGNOSIS — F332 Major depressive disorder, recurrent severe without psychotic features: Secondary | ICD-10-CM | POA: Diagnosis not present

## 2023-10-29 DIAGNOSIS — F332 Major depressive disorder, recurrent severe without psychotic features: Secondary | ICD-10-CM | POA: Diagnosis not present

## 2023-10-30 DIAGNOSIS — F332 Major depressive disorder, recurrent severe without psychotic features: Secondary | ICD-10-CM | POA: Diagnosis not present

## 2023-10-31 DIAGNOSIS — F332 Major depressive disorder, recurrent severe without psychotic features: Secondary | ICD-10-CM | POA: Diagnosis not present

## 2023-11-06 DIAGNOSIS — F332 Major depressive disorder, recurrent severe without psychotic features: Secondary | ICD-10-CM | POA: Diagnosis not present

## 2023-11-13 DIAGNOSIS — F3342 Major depressive disorder, recurrent, in full remission: Secondary | ICD-10-CM | POA: Diagnosis not present

## 2023-11-13 DIAGNOSIS — F900 Attention-deficit hyperactivity disorder, predominantly inattentive type: Secondary | ICD-10-CM | POA: Diagnosis not present

## 2023-11-13 DIAGNOSIS — F5101 Primary insomnia: Secondary | ICD-10-CM | POA: Diagnosis not present

## 2023-11-28 DIAGNOSIS — F331 Major depressive disorder, recurrent, moderate: Secondary | ICD-10-CM | POA: Diagnosis not present

## 2023-11-28 DIAGNOSIS — F5101 Primary insomnia: Secondary | ICD-10-CM | POA: Diagnosis not present

## 2023-11-28 DIAGNOSIS — F410 Panic disorder [episodic paroxysmal anxiety] without agoraphobia: Secondary | ICD-10-CM | POA: Diagnosis not present

## 2023-11-28 DIAGNOSIS — F900 Attention-deficit hyperactivity disorder, predominantly inattentive type: Secondary | ICD-10-CM | POA: Diagnosis not present

## 2023-12-02 DIAGNOSIS — F332 Major depressive disorder, recurrent severe without psychotic features: Secondary | ICD-10-CM | POA: Diagnosis not present

## 2023-12-03 DIAGNOSIS — M5116 Intervertebral disc disorders with radiculopathy, lumbar region: Secondary | ICD-10-CM | POA: Diagnosis not present

## 2023-12-03 DIAGNOSIS — M5126 Other intervertebral disc displacement, lumbar region: Secondary | ICD-10-CM | POA: Diagnosis not present

## 2023-12-12 ENCOUNTER — Encounter: Payer: Self-pay | Admitting: Family Medicine

## 2023-12-12 ENCOUNTER — Ambulatory Visit: Admitting: Family Medicine

## 2023-12-12 VITALS — BP 136/79 | HR 103 | Temp 97.6°F | Ht 71.0 in | Wt 202.4 lb

## 2023-12-12 DIAGNOSIS — R6889 Other general symptoms and signs: Secondary | ICD-10-CM

## 2023-12-12 MED ORDER — TIZANIDINE HCL 2 MG PO TABS: 2.0000 mg | ORAL_TABLET | Freq: Four times a day (QID) | ORAL | 0 refills | Status: AC | PRN

## 2023-12-12 NOTE — Progress Notes (Signed)
 Subjective:    Patient ID: Terry [name], male    DOB: 16-Feb-1986, 38 y.o.   MRN: [id number]  Patient recently had back surgery performed.  He underwent a laminectomy and microdiscectomy.  After the surgery he was taking oxycodone 1 pill every 6 hours.  He took approximately 30 pills.  He was also taking tizanidine  4 mg pills however he was taking 2 pills every 4 hours.  Therefore he was taking 48 mg of tizanidine  on a daily basis.  He was doing this for the last 2 weeks approximately.  The patient then abruptly stopped the oxycodone and the tizanidine .  Over the last 24 hours, the patient has developed a tremor in both upper extremities.  He is shaking uncontrollably.  Both pupils are dilated but reactive to light.  He also reports sweating.  He states that he feels hot all the time.  He reports nausea.  He denies any vomiting.  He denies any diarrhea.  He denies any dysuria however he is having urinary retention.  Patient states it is very difficult for him to produce urine.  He appears extremely anxious today.  EKG shows sinus arrhythmia.  However patient is in normal sinus rhythm with normal intervals and a normal axis.  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  He denies any cough or chest pain or pleurisy or shortness of breath. Past Medical History:  Diagnosis Date   Acute appendicitis 02/08/2013   ADHD (attention deficit hyperactivity disorder)    Allergic rhinitis    Allergy     Anxiety    Appendicitis, acute 02/08/2013   Depression    since 8th grade, hospitalized 2015 ([place] )   HSV infection    Past Surgical History:  Procedure Laterality Date   APPENDECTOMY     2013   LAPAROSCOPIC APPENDECTOMY N/A 02/08/2013   Procedure: APPENDECTOMY LAPAROSCOPIC;  Surgeon: Krystal CHRISTELLA Spinner, MD;  Location: [facility];  Service: General;  Laterality: N/A;   WISDOM TOOTH EXTRACTION     Current Outpatient Medications on File Prior to Visit  Medication Sig Dispense Refill   CLOMID 50 MG  tablet Take by mouth.     clonazePAM  (KLONOPIN ) 0.5 MG tablet Take 1 tablet (0.5 mg total) by mouth 2 (two) times daily as needed for anxiety. 30 tablet 1   Dextromethorphan-buPROPion ER (AUVELITY) 45-105 MG TBCR Take by mouth.     HYDROcodone -acetaminophen  (NORCO/VICODIN) 5-325 MG tablet Take 1-2 tablets by mouth every 4 (four) hours as needed for severe pain (pain score 7-10).     levothyroxine  (SYNTHROID ) 50 MCG tablet Take 1 tablet (50 mcg total) by mouth daily. 30 tablet 3   lisdexamfetamine (VYVANSE ) 60 MG capsule Take 60 mg by mouth every morning.     lithium  carbonate (ESKALITH) 450 MG ER tablet SMARTSIG:1 Tablet(s) By Mouth Every Evening     SPRAVATO, 84 MG DOSE, 28 MG/DEVICE SOPK Place into both nostrils.     valACYclovir  (VALTREX ) 1000 MG tablet Take 1 tablet (1,000 mg total) by mouth 2 (two) times daily as needed. 30 tablet 0   OLANZapine  (ZYPREXA ) 5 MG tablet Take 1 tablet (5 mg total) by mouth at bedtime. (Patient not taking: Reported on 12/12/2023) 90 tablet 3   traZODone  (DESYREL ) 100 MG tablet TAKE 1 TABLET BY MOUTH EVERYDAY AT BEDTIME (Patient not taking: Reported on 12/12/2023) 90 tablet 3   No current facility-administered medications on file prior to visit.   No Known Allergies Social History   Socioeconomic History  Marital status: Single    Spouse name: Not on file   Number of children: 0   Years of education: Not on file   Highest education level: Bachelor's degree (e.g., BA, AB, BS)  Occupational History   Occupation: works in garden department    Comment: [employer]   Tobacco Use   Smoking status: Former    Current packs/day: 0.00    Average packs/day: 0.5 packs/day for 3.0 years (1.5 ttl pk-yrs)    Types: Cigarettes    Start date: 02/26/2004    Quit date: 02/26/2007    Years since quitting: 16.8   Smokeless tobacco: Never  Vaping Use   Vaping status: Never Used  Substance and Sexual Activity   Alcohol use: Yes    Alcohol/week: 9.0 standard drinks of alcohol     Types: 9 Cans of beer per week    Comment: 2-3   Drug use: Yes    Frequency: 7.0 times per week    Types: Marijuana   Sexual activity: Not on file  Other Topics Concern   Not on file  Social History Narrative   Lives at home alone   Right handed   Caffeine: 1 cup each morning    Social Drivers of Health   Financial Resource Strain: Not on file  Food Insecurity: Not on file  Transportation Needs: Not on file  Physical Activity: Not on file  Stress: Not on file  Social Connections: Unknown (07/09/2021)   [facility]   Social Network    Social Network: Not on file  Intimate Partner Violence: Unknown (06/01/2021)   Received from [facility]   HITS    Physically Hurt: Not on file    Insult or Talk Down To: Not on file    Threaten Physical Harm: Not on file    Scream or Curse: Not on file   Family History  Problem Relation Age of Onset   Allergic rhinitis Mother    Cancer Maternal Grandfather        bone and prostate     Review of Systems  All other systems reviewed and are negative.      Objective:   Physical Exam Vitals reviewed.  Constitutional:      General: He is not in acute distress.    Appearance: He is well-developed and normal weight. He is not ill-appearing, toxic-appearing or diaphoretic.  Eyes:     General: No visual field deficit. Cardiovascular:     Rate and Rhythm: Regular rhythm. Tachycardia present.     Heart sounds: Normal heart sounds. No murmur heard.    No friction rub. No gallop.  Pulmonary:     Effort: Pulmonary effort is normal. No respiratory distress.     Breath sounds: Normal breath sounds. No wheezing or rales.  Chest:     Chest wall: No tenderness.  Abdominal:     General: There is no distension.     Palpations: Abdomen is soft.     Tenderness: There is no abdominal tenderness.  Musculoskeletal:     Left elbow: No swelling, deformity or effusion. Normal range of motion. No tenderness.  Neurological:      Mental Status: He is alert and oriented to person, place, and time.     Cranial Nerves: Cranial nerves 2-12 are intact. No cranial nerve deficit, dysarthria or facial asymmetry.     Sensory: Sensation is intact. No sensory deficit.     Motor: Tremor present. No weakness, atrophy or seizure activity.  Gait: Gait abnormal.  Psychiatric:        Mood and Affect: Mood is anxious. Mood is not depressed.        Speech: He is communicative. Speech is not rapid and pressured, delayed, slurred or tangential.        Behavior: Behavior is not slowed, withdrawn or combative. Behavior is cooperative.        Thought Content: Thought content normal. Thought content is not paranoid or delusional. Thought content does not include homicidal or suicidal ideation.        Cognition and Memory: Cognition and memory normal.        Judgment: Judgment normal. Judgment is not impulsive or inappropriate.           Assessment & Plan:  Withdrawal complaint - Plan: EKG 12-Lead  I believe the patient is suffering from tizanidine  withdrawal.  He has been taking high doses/48 mg daily for the last 2 weeks straight.  This was in conjunction with a narcotic.  Both were abruptly discontinued.  He is now experiencing tremor, tachycardia, anxiety, nausea, and urinary retention.  Recommended resuming tizanidine  at 2 mg every 6 hours for 1 day, then 2 mg every 8 hours for 2 days, then 2 mg every 12 hours for 2 days, then only as needed.  His parents are staying with the patient and will administer the medication.  He denies any other drugs of abuse.  The only other medication potentially could be the opiate that he was taking after surgery.  This would manifest as similar symptoms including the tachycardia, the hyperthermia, the dilated pupils, the nausea.  However he denies any yawning or diarrhea.  Furthermore he states that he only had 30 pills over the last 2 weeks which would equate to 2 pills a day.  I do not feel that that  is sufficient to cause withdrawal from oxycodone.

## 2023-12-16 ENCOUNTER — Telehealth: Payer: Self-pay

## 2023-12-16 NOTE — Telephone Encounter (Signed)
 Referral from [facility] reviewed by Dr. Chalice and declined due to only concern being insomnia.

## 2023-12-18 DIAGNOSIS — F411 Generalized anxiety disorder: Secondary | ICD-10-CM | POA: Diagnosis not present

## 2023-12-18 DIAGNOSIS — F331 Major depressive disorder, recurrent, moderate: Secondary | ICD-10-CM | POA: Diagnosis not present

## 2023-12-24 DIAGNOSIS — L309 Dermatitis, unspecified: Secondary | ICD-10-CM | POA: Diagnosis not present

## 2023-12-24 DIAGNOSIS — L814 Other melanin hyperpigmentation: Secondary | ICD-10-CM | POA: Diagnosis not present

## 2023-12-24 DIAGNOSIS — D1801 Hemangioma of skin and subcutaneous tissue: Secondary | ICD-10-CM | POA: Diagnosis not present

## 2023-12-24 DIAGNOSIS — F332 Major depressive disorder, recurrent severe without psychotic features: Secondary | ICD-10-CM | POA: Diagnosis not present

## 2023-12-24 DIAGNOSIS — L821 Other seborrheic keratosis: Secondary | ICD-10-CM | POA: Diagnosis not present

## 2023-12-29 ENCOUNTER — Encounter: Payer: Self-pay | Admitting: Family Medicine

## 2023-12-29 DIAGNOSIS — F411 Generalized anxiety disorder: Secondary | ICD-10-CM | POA: Diagnosis not present

## 2023-12-30 ENCOUNTER — Ambulatory Visit: Admitting: Family Medicine

## 2023-12-30 VITALS — BP 138/78 | HR 93 | Temp 98.6°F | Ht 71.0 in | Wt 201.4 lb

## 2023-12-30 DIAGNOSIS — E291 Testicular hypofunction: Secondary | ICD-10-CM | POA: Diagnosis not present

## 2023-12-30 DIAGNOSIS — R413 Other amnesia: Secondary | ICD-10-CM

## 2023-12-30 DIAGNOSIS — G4710 Hypersomnia, unspecified: Secondary | ICD-10-CM | POA: Diagnosis not present

## 2023-12-30 NOTE — Progress Notes (Signed)
 Subjective:    Patient ID: Terry [name], male    DOB: Feb 02, 1986, 38 y.o.   MRN: [id number]  Patient is doing much better today compared to the last time I saw him.  He has weaned himself down on the tizanidine  to 2 mg at night.  He denies any further withdrawal symptoms.  His concern today however his memory loss.  He states that he finds himself losing his concentration and train of thought midsentence.  At times he is having a difficult time trying to remember the words he is trying to say.  He denies any significant short-term memory loss.  He denies getting lost while driving or forgetting people's names that he knows well.  He is not having issues performing activities of daily living.  Patient has no tremor or evidence of parkinsonism.  He does not have a flat masslike affect.  There is no cogwheel rigidity.  Patient denies any hallucinations or delusions.  There is no evidence of decreased executive functioning or impulse control.  My concern is polypharmacy.  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  He is on numerous centrally acting medications.  He is also on testosterone  and Clomid through his urologist for hypogonadism.  We have not screened him for B12 deficiency.  His thyroid  has not been checked in quite some time.  Of note the patient does report hypersomnolence.  He states that he is sleeping all throughout the day no matter how much rest he gets.  He does not fall asleep driving.  He does not fall asleep mid conversation.  He does not fall asleep after eating.  However he generally feels sleepy irregardless of the time of day. Past Medical History:  Diagnosis Date   Acute appendicitis 02/08/2013   ADHD (attention deficit hyperactivity disorder)    Allergic rhinitis    Allergy     Anxiety    Appendicitis, acute 02/08/2013   Depression    since 8th grade, hospitalized 2015 ([place] )   HSV infection    Past Surgical History:  Procedure Laterality  Date   APPENDECTOMY     2013   LAPAROSCOPIC APPENDECTOMY N/A 02/08/2013   Procedure: APPENDECTOMY LAPAROSCOPIC;  Surgeon: Krystal CHRISTELLA Spinner, MD;  Location: [facility];  Service: General;  Laterality: N/A;   WISDOM TOOTH EXTRACTION     Current Outpatient Medications on File Prior to Visit  Medication Sig Dispense Refill   CLOMID 50 MG tablet Take by mouth.     clonazePAM  (KLONOPIN ) 0.5 MG tablet Take 1 tablet (0.5 mg total) by mouth 2 (two) times daily as needed for anxiety. 30 tablet 1   Dextromethorphan-buPROPion ER (AUVELITY) 45-105 MG TBCR Take by mouth.     HYDROcodone -acetaminophen  (NORCO/VICODIN) 5-325 MG tablet Take 1-2 tablets by mouth every 4 (four) hours as needed for severe pain (pain score 7-10).     levothyroxine  (SYNTHROID ) 50 MCG tablet Take 1 tablet (50 mcg total) by mouth daily. 30 tablet 3   lisdexamfetamine (VYVANSE ) 60 MG capsule Take 60 mg by mouth every morning.     lithium  carbonate (ESKALITH) 450 MG ER tablet SMARTSIG:1 Tablet(s) By Mouth Every Evening     OLANZapine  (ZYPREXA ) 5 MG tablet Take 1 tablet (5 mg total) by mouth at bedtime. 90 tablet 3   SPRAVATO, 84 MG DOSE, 28 MG/DEVICE SOPK Place into both nostrils.     tiZANidine  (ZANAFLEX ) 2 MG tablet Take 1 tablet (2 mg total) by mouth every 6 (six) hours as needed  for muscle spasms. 30 tablet 0   valACYclovir  (VALTREX ) 1000 MG tablet Take 1 tablet (1,000 mg total) by mouth 2 (two) times daily as needed. 30 tablet 0   traZODone  (DESYREL ) 100 MG tablet TAKE 1 TABLET BY MOUTH EVERYDAY AT BEDTIME (Patient not taking: Reported on 12/30/2023) 90 tablet 3   No current facility-administered medications on file prior to visit.   No Known Allergies Social History   Socioeconomic History   Marital status: Single    Spouse name: Not on file   Number of children: 0   Years of education: Not on file   Highest education level: Bachelor's degree (e.g., BA, AB, BS)  Occupational History   Occupation: works in garden department     Comment: [employer]   Tobacco Use   Smoking status: Former    Current packs/day: 0.00    Average packs/day: 0.5 packs/day for 3.0 years (1.5 ttl pk-yrs)    Types: Cigarettes    Start date: 02/26/2004    Quit date: 02/26/2007    Years since quitting: 16.8   Smokeless tobacco: Never  Vaping Use   Vaping status: Never Used  Substance and Sexual Activity   Alcohol use: Yes    Alcohol/week: 9.0 standard drinks of alcohol    Types: 9 Cans of beer per week    Comment: 2-3   Drug use: Yes    Frequency: 7.0 times per week    Types: Marijuana   Sexual activity: Not on file  Other Topics Concern   Not on file  Social History Narrative   Lives at home alone   Right handed   Caffeine: 1 cup each morning    Social Drivers of Health   Financial Resource Strain: Not on file  Food Insecurity: Not on file  Transportation Needs: Not on file  Physical Activity: Not on file  Stress: Not on file  Social Connections: Unknown (07/09/2021)   [facility]   Social Network    Social Network: Not on file  Intimate Partner Violence: Unknown (06/01/2021)   Received from [facility]   HITS    Physically Hurt: Not on file    Insult or Talk Down To: Not on file    Threaten Physical Harm: Not on file    Scream or Curse: Not on file   Family History  Problem Relation Age of Onset   Allergic rhinitis Mother    Cancer Maternal Grandfather        bone and prostate     Review of Systems  All other systems reviewed and are negative.      Objective:   Physical Exam Vitals reviewed.  Constitutional:      General: He is not in acute distress.    Appearance: He is well-developed and normal weight. He is not ill-appearing, toxic-appearing or diaphoretic.  Eyes:     General: No visual field deficit. Cardiovascular:     Rate and Rhythm: Normal rate and regular rhythm.     Heart sounds: Normal heart sounds. No murmur heard.    No friction rub. No gallop.  Pulmonary:     Effort:  Pulmonary effort is normal. No respiratory distress.     Breath sounds: Normal breath sounds. No wheezing or rales.  Chest:     Chest wall: No tenderness.  Abdominal:     General: There is no distension.     Palpations: Abdomen is soft.     Tenderness: There is no abdominal tenderness.  Musculoskeletal:     Left elbow: No swelling, deformity or effusion. Normal range of motion. No tenderness.  Neurological:     Mental Status: He is alert and oriented to person, place, and time.     Cranial Nerves: Cranial nerves 2-12 are intact. No cranial nerve deficit, dysarthria or facial asymmetry.     Sensory: Sensation is intact. No sensory deficit.     Motor: No weakness, tremor, atrophy or seizure activity.     Gait: Gait normal.  Psychiatric:        Mood and Affect: Mood normal. Mood is not anxious or depressed.        Speech: He is communicative. Speech is not rapid and pressured, delayed, slurred or tangential.        Behavior: Behavior normal. Behavior is not slowed, withdrawn or combative. Behavior is cooperative.        Thought Content: Thought content normal. Thought content is not paranoid or delusional. Thought content does not include homicidal or suicidal ideation.        Cognition and Memory: Cognition and memory normal.        Judgment: Judgment normal. Judgment is not impulsive or inappropriate.           Assessment & Plan:  Memory loss - Plan: Vitamin B12, Testosterone  Total,Free,Bio, Males, TSH, CBC with Differential/Platelet, Comprehensive metabolic panel with GFR  Hypersomnolence Patient is requesting a referral for a sleep study due to his hypersomnolence.  He is concerned that sleep apnea may be affecting his memory.  Given his hypersomnolence I believe this is appropriate.  I also recommended screening for vitamin B12 deficiency, hypergonadism, and hypothyroidism.  We discussed syphilis exposure however the patient has not been in a relationship in 2 years and believes  that this is unlikely.  I do not believe that the patient has dementia.  I believe much more likely that medication side effects could be affecting his focus and compromising his train of thought.  I recommended that he discuss this with his psychiatrist and see if they can reduce some of the polypharmacy.  It also may be exacerbated by his underlying ADHD as well as depression.  I believe that his memory loss is likely multifactorial and related to the ADHD and depression coupled with polypharmacy.  If labs are normal and symptoms progress I would recommend neurocognitive testing however I will schedule a sleep study for the patient

## 2023-12-31 DIAGNOSIS — M5126 Other intervertebral disc displacement, lumbar region: Secondary | ICD-10-CM | POA: Diagnosis not present

## 2023-12-31 LAB — CBC WITH DIFFERENTIAL/PLATELET
Absolute Lymphocytes: 2831 {cells}/uL (ref 850–3900)
Absolute Monocytes: 655 {cells}/uL (ref 200–950)
Basophils Absolute: 34 {cells}/uL (ref 0–200)
Basophils Relative: 0.4 %
Eosinophils Absolute: 84 {cells}/uL (ref 15–500)
Eosinophils Relative: 1 %
HCT: 47.4 % (ref 38.5–50.0)
Hemoglobin: 15.8 g/dL (ref 13.2–17.1)
MCH: 30.3 pg (ref 27.0–33.0)
MCHC: 33.3 g/dL (ref 32.0–36.0)
MCV: 90.8 fL (ref 80.0–100.0)
MPV: 9.8 fL (ref 7.5–12.5)
Monocytes Relative: 7.8 %
Neutro Abs: 4796 {cells}/uL (ref 1500–7800)
Neutrophils Relative %: 57.1 %
Platelets: 255 Thousand/uL (ref 140–400)
RBC: 5.22 Million/uL (ref 4.20–5.80)
RDW: 13.1 % (ref 11.0–15.0)
Total Lymphocyte: 33.7 %
WBC: 8.4 Thousand/uL (ref 3.8–10.8)

## 2023-12-31 LAB — COMPREHENSIVE METABOLIC PANEL WITH GFR
AG Ratio: 2 (calc) (ref 1.0–2.5)
ALT: 18 U/L (ref 9–46)
AST: 15 U/L (ref 10–40)
Albumin: 4.7 g/dL (ref 3.6–5.1)
Alkaline phosphatase (APISO): 48 U/L (ref 36–130)
BUN: 18 mg/dL (ref 7–25)
CO2: 24 mmol/L (ref 20–32)
Calcium: 9.5 mg/dL (ref 8.6–10.3)
Chloride: 107 mmol/L (ref 98–110)
Creat: 1.05 mg/dL (ref 0.60–1.26)
Globulin: 2.4 g/dL (ref 1.9–3.7)
Glucose, Bld: 99 mg/dL (ref 65–99)
Potassium: 4.5 mmol/L (ref 3.5–5.3)
Sodium: 141 mmol/L (ref 135–146)
Total Bilirubin: 0.2 mg/dL (ref 0.2–1.2)
Total Protein: 7.1 g/dL (ref 6.1–8.1)
eGFR: 94 mL/min/1.73m2 (ref >=60)

## 2023-12-31 LAB — TESTOSTERONE TOTAL,FREE,BIO, MALES
Albumin: 4.7 g/dL (ref 3.6–5.1)
Sex Hormone Binding: 21 nmol/L (ref 10–50)
Testosterone, Bioavailable: 342.3 ng/dL (ref 110.0–575.0)
Testosterone, Free: 159.7 pg/mL (ref 46.0–224.0)
Testosterone: 774 ng/dL (ref 3.6–827)

## 2023-12-31 LAB — VITAMIN B12: Vitamin B-12: 718 pg/mL (ref 200–1100)

## 2023-12-31 LAB — TSH: TSH: 1.24 m[IU]/L (ref 0.40–4.50)

## 2024-01-01 ENCOUNTER — Ambulatory Visit: Payer: Self-pay | Admitting: Family Medicine

## 2024-01-02 DIAGNOSIS — M629 Disorder of muscle, unspecified: Secondary | ICD-10-CM | POA: Diagnosis not present

## 2024-01-07 DIAGNOSIS — F5101 Primary insomnia: Secondary | ICD-10-CM | POA: Diagnosis not present

## 2024-01-07 DIAGNOSIS — F900 Attention-deficit hyperactivity disorder, predominantly inattentive type: Secondary | ICD-10-CM | POA: Diagnosis not present

## 2024-01-07 DIAGNOSIS — F3341 Major depressive disorder, recurrent, in partial remission: Secondary | ICD-10-CM | POA: Diagnosis not present

## 2024-01-07 DIAGNOSIS — F410 Panic disorder [episodic paroxysmal anxiety] without agoraphobia: Secondary | ICD-10-CM | POA: Diagnosis not present

## 2024-01-08 DIAGNOSIS — M629 Disorder of muscle, unspecified: Secondary | ICD-10-CM | POA: Diagnosis not present

## 2024-01-08 DIAGNOSIS — M5126 Other intervertebral disc displacement, lumbar region: Secondary | ICD-10-CM | POA: Diagnosis not present

## 2024-01-08 DIAGNOSIS — F411 Generalized anxiety disorder: Secondary | ICD-10-CM | POA: Diagnosis not present

## 2024-01-08 DIAGNOSIS — F331 Major depressive disorder, recurrent, moderate: Secondary | ICD-10-CM | POA: Diagnosis not present

## 2024-01-13 DIAGNOSIS — F332 Major depressive disorder, recurrent severe without psychotic features: Secondary | ICD-10-CM | POA: Diagnosis not present

## 2024-01-14 DIAGNOSIS — M629 Disorder of muscle, unspecified: Secondary | ICD-10-CM | POA: Diagnosis not present

## 2024-01-14 DIAGNOSIS — M5126 Other intervertebral disc displacement, lumbar region: Secondary | ICD-10-CM | POA: Diagnosis not present

## 2024-01-15 DIAGNOSIS — F331 Major depressive disorder, recurrent, moderate: Secondary | ICD-10-CM | POA: Diagnosis not present

## 2024-01-15 DIAGNOSIS — F411 Generalized anxiety disorder: Secondary | ICD-10-CM | POA: Diagnosis not present

## 2024-01-20 ENCOUNTER — Encounter: Payer: Self-pay | Admitting: Family Medicine

## 2024-01-20 DIAGNOSIS — M5126 Other intervertebral disc displacement, lumbar region: Secondary | ICD-10-CM | POA: Diagnosis not present

## 2024-01-20 DIAGNOSIS — M629 Disorder of muscle, unspecified: Secondary | ICD-10-CM | POA: Diagnosis not present

## 2024-01-20 DIAGNOSIS — F411 Generalized anxiety disorder: Secondary | ICD-10-CM | POA: Diagnosis not present

## 2024-01-20 DIAGNOSIS — F332 Major depressive disorder, recurrent severe without psychotic features: Secondary | ICD-10-CM | POA: Diagnosis not present

## 2024-01-20 DIAGNOSIS — F331 Major depressive disorder, recurrent, moderate: Secondary | ICD-10-CM | POA: Diagnosis not present

## 2024-01-21 ENCOUNTER — Other Ambulatory Visit: Payer: Self-pay

## 2024-01-21 DIAGNOSIS — G4710 Hypersomnia, unspecified: Secondary | ICD-10-CM

## 2024-01-21 NOTE — Telephone Encounter (Signed)
 Copied from CRM #[id number]. Topic: Referral - Question >> Jan 20, 2024  4:07 PM Hadassah PARAS wrote: Reason for CRM: Pt called to schedule his sleep study. He was advised the referall is no longer active and will need a new one to schedule study. Please advise #[phone].

## 2024-01-26 DIAGNOSIS — M629 Disorder of muscle, unspecified: Secondary | ICD-10-CM | POA: Diagnosis not present

## 2024-01-26 DIAGNOSIS — M5126 Other intervertebral disc displacement, lumbar region: Secondary | ICD-10-CM | POA: Diagnosis not present

## 2024-01-27 DIAGNOSIS — M629 Disorder of muscle, unspecified: Secondary | ICD-10-CM | POA: Diagnosis not present

## 2024-01-29 DIAGNOSIS — F411 Generalized anxiety disorder: Secondary | ICD-10-CM | POA: Diagnosis not present

## 2024-01-29 DIAGNOSIS — F331 Major depressive disorder, recurrent, moderate: Secondary | ICD-10-CM | POA: Diagnosis not present

## 2024-02-03 DIAGNOSIS — M5126 Other intervertebral disc displacement, lumbar region: Secondary | ICD-10-CM | POA: Diagnosis not present

## 2024-02-04 DIAGNOSIS — E291 Testicular hypofunction: Secondary | ICD-10-CM | POA: Insufficient documentation

## 2024-02-04 DIAGNOSIS — E039 Hypothyroidism, unspecified: Secondary | ICD-10-CM | POA: Insufficient documentation

## 2024-02-04 DIAGNOSIS — E237 Disorder of pituitary gland, unspecified: Secondary | ICD-10-CM | POA: Insufficient documentation

## 2024-02-07 ENCOUNTER — Other Ambulatory Visit: Payer: Self-pay | Admitting: Family Medicine

## 2024-02-07 DIAGNOSIS — E038 Other specified hypothyroidism: Secondary | ICD-10-CM

## 2024-02-09 ENCOUNTER — Other Ambulatory Visit: Payer: Self-pay | Admitting: Family Medicine

## 2024-02-09 DIAGNOSIS — E038 Other specified hypothyroidism: Secondary | ICD-10-CM

## 2024-02-09 NOTE — Telephone Encounter (Unsigned)
 Copied from CRM #[id number]. Topic: Clinical - Medication Refill >> Feb 09, 2024  3:16 PM Yolanda T wrote: Medication: levothyroxine  (SYNTHROID ) 50 MCG tablet  Has the patient contacted their pharmacy? No  This is the patient's preferred pharmacy:   [id number] [place], [place] - [address] AT [facility] [address] [facility] [address] [facility] [address] [place] [address] Phone: [phone] Fax: [phone]  Is this the correct pharmacy for this prescription? Yes  Has the prescription been filled recently? Yes  Is the patient out of the medication? No  Has the patient been seen for an appointment in the last year OR does the patient have an upcoming appointment? Yes  Can we respond through MyChart? Yes  Agent: Please be advised that Rx refills may take up to 3 business days. We ask that you follow-up with your pharmacy.

## 2024-02-12 MED ORDER — LEVOTHYROXINE SODIUM 50 MCG PO TABS: 50.0000 ug | ORAL_TABLET | Freq: Every day | ORAL | 1 refills | Status: AC

## 2024-02-12 NOTE — Telephone Encounter (Signed)
 Medication Rx sent to [facility] on [facility]/09/24 at 11:45 am. Called [facility] to verify and pharmacy staff reports medication has not been picked up by patient. Patient requesting to be sent to different pharmacy [facility].

## 2024-02-12 NOTE — Telephone Encounter (Signed)
 Requested by patient Rx to be sent to different pharmacy than sent on 02/09/24 [facility]. Verified Rx has not been picked up at [facility].  Requested Prescriptions  Pending Prescriptions Disp Refills   levothyroxine  (SYNTHROID ) 50 MCG tablet 90 tablet 1    Sig: Take 1 tablet (50 mcg total) by mouth daily.     Endocrinology:  Hypothyroid Agents Passed - 02/12/2024 11:24 AM      Passed - TSH in normal range and within 360 days    TSH  Date Value Ref Range Status  12/30/2023 1.24 0.40 - 4.50 mIU/L Final         Passed - Valid encounter within last 12 months    Recent Outpatient Visits           1 month ago Memory loss   [place] [facility] Pickard, Butler DASEN, MD   2 months ago Withdrawal complaint   [place] [facility] Duanne Butler DASEN, MD   7 months ago Visit for suture removal   [place] [facility] Duanne Butler DASEN, MD   8 months ago Pain in left testicle   [place] [facility] Duanne Butler DASEN, MD   1 year ago Visit for suture removal   [place] [facility] Pickard, Butler DASEN, MD

## 2024-03-01 ENCOUNTER — Telehealth: Payer: Self-pay

## 2024-03-01 NOTE — Telephone Encounter (Signed)
 Sent a message to pt father asking him to please send messages regarding Terry [name] on Terry [name]'s MyChart.

## 2024-03-01 NOTE — Telephone Encounter (Signed)
 [place] JINNY Gilbert Signe to [facility] (supporting Butler ONEIDA Burr, MD) (Selected Message)     02/29/24 11:25 AM Terry [name] is not doing well at all. You had mentioned [name] [sp?] when I saw you last . We don't know the process concerning referrals etc.....so any and all advice would be appreciated.  [name] has given up on his long term counselor [name] [ [email or web address] ]. She used to be able to take him out of a mental depression hole, but he says she only goes over the same old things. A new [few weeks] trauma counselor [name]  [ [email or web address] ] seems not to be making much difference to date. His back is in bad shape - hurting all the time! - and he is scheduled to redo the surgery that didn't work, i.e. more disc protrusion etc. [name] is very depressed over all of this and needs someone to talk with other than us .  We have tried other sources, but any we believed to possibly be a good match and hopefully effective, had openings so far out that they would not be of help to [name] quickly. If [name] is available, do we need to call him or could you make a difference in getting [name] in to see him? Do you think a call from you to [facility] - I have talked to [facility] about your and my discussions of his needs - would help him to make an effort to contact [name], if that is the best?  I don't know the relationship or protocol for any of this, so PLEASE excuse me if I am crossing any lines I shouldn't. I will understand.  As always, thank you...SABRASABRAI hope you had a nice weekend in your workshop!! Signe

## 2024-04-20 ENCOUNTER — Encounter: Admitting: Family Medicine
